# Patient Record
Sex: Female | Born: 1945 | Race: Black or African American | Hispanic: No | Marital: Single | Smoking: Current some day smoker
Health system: Southern US, Community
[De-identification: ages and names within clinical notes are randomized; demographics above are authoritative.]

## PROBLEM LIST (undated history)

## (undated) DIAGNOSIS — I700 Atherosclerosis of aorta: Secondary | ICD-10-CM

## (undated) DIAGNOSIS — Z8759 Personal history of other complications of pregnancy, childbirth and the puerperium: Secondary | ICD-10-CM

## (undated) DIAGNOSIS — F411 Generalized anxiety disorder: Secondary | ICD-10-CM

## (undated) DIAGNOSIS — J439 Emphysema, unspecified: Secondary | ICD-10-CM

## (undated) DIAGNOSIS — I779 Disorder of arteries and arterioles, unspecified: Secondary | ICD-10-CM

## (undated) DIAGNOSIS — D649 Anemia, unspecified: Secondary | ICD-10-CM

## (undated) DIAGNOSIS — K219 Gastro-esophageal reflux disease without esophagitis: Secondary | ICD-10-CM

## (undated) DIAGNOSIS — E209 Hypoparathyroidism, unspecified: Secondary | ICD-10-CM

## (undated) DIAGNOSIS — E039 Hypothyroidism, unspecified: Secondary | ICD-10-CM

## (undated) HISTORY — DX: Generalized anxiety disorder: F41.1

## (undated) HISTORY — DX: Atherosclerosis of aorta: I70.0

## (undated) HISTORY — DX: Personal history of other complications of pregnancy, childbirth and the puerperium: Z87.59

## (undated) HISTORY — DX: Hypoparathyroidism, unspecified: E20.9

## (undated) HISTORY — DX: Hypothyroidism, unspecified: E03.9

## (undated) HISTORY — DX: Gastro-esophageal reflux disease without esophagitis: K21.9

## (undated) HISTORY — PX: TONSILLECTOMY: SUR1361

## (undated) HISTORY — DX: Emphysema, unspecified: J43.9

---

## 2016-05-15 ENCOUNTER — Emergency Department (HOSPITAL_BASED_OUTPATIENT_CLINIC_OR_DEPARTMENT_OTHER)
Admission: EM | Admit: 2016-05-15 | Discharge: 2016-05-15 | Disposition: A | Discharge status: Home / Self Care | Payer: Medicare Other | Attending: Emergency Medicine | Admitting: Emergency Medicine

## 2016-05-15 ENCOUNTER — Encounter (HOSPITAL_BASED_OUTPATIENT_CLINIC_OR_DEPARTMENT_OTHER): Payer: Self-pay | Admitting: Emergency Medicine

## 2016-05-15 ENCOUNTER — Emergency Department (HOSPITAL_BASED_OUTPATIENT_CLINIC_OR_DEPARTMENT_OTHER): Payer: Medicare Other

## 2016-05-15 DIAGNOSIS — E010 Iodine-deficiency related diffuse (endemic) goiter: Secondary | ICD-10-CM | POA: Diagnosis not present

## 2016-05-15 DIAGNOSIS — R0602 Shortness of breath: Secondary | ICD-10-CM | POA: Diagnosis not present

## 2016-05-15 DIAGNOSIS — F419 Anxiety disorder, unspecified: Secondary | ICD-10-CM | POA: Diagnosis not present

## 2016-05-15 DIAGNOSIS — R0789 Other chest pain: Secondary | ICD-10-CM | POA: Diagnosis present

## 2016-05-15 DIAGNOSIS — R079 Chest pain, unspecified: Secondary | ICD-10-CM | POA: Diagnosis not present

## 2016-05-15 DIAGNOSIS — K219 Gastro-esophageal reflux disease without esophagitis: Secondary | ICD-10-CM | POA: Insufficient documentation

## 2016-05-15 DIAGNOSIS — F17200 Nicotine dependence, unspecified, uncomplicated: Secondary | ICD-10-CM | POA: Diagnosis not present

## 2016-05-15 LAB — CBC
HEMATOCRIT: 39.3 % (ref 36.0–46.0)
HEMOGLOBIN: 14.3 g/dL (ref 12.0–15.0)
MCH: 40.9 pg — AB (ref 26.0–34.0)
MCHC: 36.4 g/dL — ABNORMAL HIGH (ref 30.0–36.0)
MCV: 112.3 fL — AB (ref 78.0–100.0)
Platelets: 186 10*3/uL (ref 150–400)
RBC: 3.5 MIL/uL — AB (ref 3.87–5.11)
RDW: 13.1 % (ref 11.5–15.5)
WBC: 4.6 10*3/uL (ref 4.0–10.5)

## 2016-05-15 LAB — HEPATIC FUNCTION PANEL
ALBUMIN: 3.1 g/dL — AB (ref 3.5–5.0)
ALK PHOS: 96 U/L (ref 38–126)
ALT: 17 U/L (ref 14–54)
AST: 42 U/L — ABNORMAL HIGH (ref 15–41)
BILIRUBIN TOTAL: 0.7 mg/dL (ref 0.3–1.2)
Bilirubin, Direct: 0.2 mg/dL (ref 0.1–0.5)
Indirect Bilirubin: 0.5 mg/dL (ref 0.3–0.9)
TOTAL PROTEIN: 8.8 g/dL — AB (ref 6.5–8.1)

## 2016-05-15 LAB — BASIC METABOLIC PANEL
Anion gap: 5 (ref 5–15)
BUN: <5 mg/dL — ABNORMAL LOW (ref 6–20)
CHLORIDE: 102 mmol/L (ref 101–111)
CO2: 28 mmol/L (ref 22–32)
Calcium: 8.6 mg/dL — ABNORMAL LOW (ref 8.9–10.3)
Creatinine, Ser: 0.74 mg/dL (ref 0.44–1.00)
GFR calc non Af Amer: >60 mL/min (ref >60)
Glucose, Bld: 96 mg/dL (ref 65–99)
Potassium: 3.9 mmol/L (ref 3.5–5.1)
Sodium: 135 mmol/L (ref 135–145)

## 2016-05-15 LAB — LIPASE, BLOOD: Lipase: 24 U/L (ref 11–51)

## 2016-05-15 LAB — TROPONIN I: Troponin I: <0.03 ng/mL (ref <0.03)

## 2016-05-15 LAB — TSH: TSH: 18.49 u[IU]/mL — ABNORMAL HIGH (ref 0.350–4.500)

## 2016-05-15 MED ORDER — PANTOPRAZOLE SODIUM 20 MG PO TBEC: 20.0000 mg | DELAYED_RELEASE_TABLET | Freq: Every day | ORAL | 0 refills | Status: DC

## 2016-05-15 MED ORDER — BUSPIRONE HCL 7.5 MG PO TABS: 7.5000 mg | ORAL_TABLET | Freq: Two times a day (BID) | ORAL | 0 refills | Status: DC

## 2016-05-15 MED ORDER — FAMOTIDINE 20 MG PO TABS
20.0000 mg | ORAL_TABLET | Freq: Once | ORAL | Status: AC
Start: 2016-05-15 — End: 2016-05-15
  Administered 2016-05-15: 20 mg via ORAL
  Filled 2016-05-15: qty 1

## 2016-05-15 MED ORDER — FAMOTIDINE 20 MG PO TABS: 20.0000 mg | ORAL_TABLET | Freq: Two times a day (BID) | ORAL | 0 refills | Status: DC

## 2016-05-15 MED ORDER — SODIUM CHLORIDE 0.9 % IV BOLUS (SEPSIS)
1000.0000 mL | Freq: Once | INTRAVENOUS | Status: AC
  Administered 2016-05-15: 1000 mL via INTRAVENOUS

## 2016-05-15 MED FILL — PANTOPRAZOLE SOD DR 20 MG T: 20 | 30 days supply | Qty: 30 | Fill #0

## 2016-05-15 MED FILL — busPIRone HCL 7.5 MG TABS: 7.5 | 30 days supply | Qty: 60 | Fill #0

## 2016-05-15 MED FILL — FAMOTIDINE 20 MG TABLET: 20 | 5 days supply | Qty: 10 | Fill #0

## 2016-05-15 NOTE — ED Triage Notes (Signed)
Epigastric chest pain for over three months.  Some acid reflux.  Pt has swollen area at throat over two months.  Pt has no physician and has not gotten health care since 2011.  Chest pain non radiating, some sob, some dizziness.  No loc.

## 2016-05-15 NOTE — ED Provider Notes (Signed)
[facility] Provider Note   CSN: [id number] Arrival date & time: 05/15/16  0804     History   Chief Complaint Chief Complaint  Patient presents with  . Chest Pain    HPI Andrea Ward is a 71 y.o. female.  HPI  71 year old female presents with multiple complaints. The primary complaint appears to be a neck mass over her anterior neck. This is been going on for at least 3 months. She does not think it is rapidly growing. She has not had any neck pain or trouble swallowing or sore throat. No weight loss. She feels like her skin has been dry for the last 1 year but otherwise no new skin changes and no hair changes. She also complains of epigastric abdominal pressure for the last 3 months or more. She states that this is a almost daily occurrence although not always daily. Typically is worst in the morning and then gradually gets better. There is no exertional component. It is inferior to her sternum. Frequently has some coughing as well as bitter taste in her mouth that seems to come out from her abdomen. She has tried Nexium once or twice but otherwise no medicines. No radiation of the pain. No back pain or chest pain. No associated shortness of breath. She also has significant anxiety that she states she's had for 3 years. She has this chronically. She used to be on BuSpar and had a trauma dose of lorazepam. These par partially help with the lorazepam was better. She has not been on either these for a while. Denies known medical problems but has not seen a doctor for at least 6 or 7 years.  No past medical history on file.  There are no active problems to display for this patient.   No past surgical history on file.  OB History    No data available       Home Medications    Prior to Admission medications   Medication Sig Start Date End Date Taking? Authorizing Provider  NON FORMULARY GABA   Yes Historical Provider, MD  busPIRone (BUSPAR) 7.5 MG tablet Take 1 tablet  (7.5 mg total) by mouth 2 (two) times daily. 05/15/16   Sherwood Gambler, MD  famotidine (PEPCID) 20 MG tablet Take 1 tablet (20 mg total) by mouth 2 (two) times daily. 05/15/16   Sherwood Gambler, MD  pantoprazole (PROTONIX) 20 MG tablet Take 1 tablet (20 mg total) by mouth daily. 05/15/16   Sherwood Gambler, MD    Family History History reviewed. No pertinent family history.  Social History Social History  Substance Use Topics  . Smoking status: Current Every Day Smoker  . Smokeless tobacco: Never Used  . Alcohol use Yes     Comment: box of wine daily     Allergies   Patient has no known allergies.   Review of Systems Review of Systems  Constitutional: Negative for unexpected weight change.  HENT:       Neck swelling  Respiratory: Positive for cough. Negative for shortness of breath.   Cardiovascular: Negative for chest pain.  Gastrointestinal: Positive for abdominal pain. Negative for nausea and vomiting.  Musculoskeletal: Negative for back pain.  All other systems reviewed and are negative.    Physical Exam Updated Vital Signs BP 139/78   Pulse 100   Temp 97.7 F (36.5 C) (Oral)   Resp 20   SpO2 95%   Physical Exam  Constitutional: She is oriented to person, place, and time. She  appears well-developed and well-nourished. No distress.  HENT:  Head: Normocephalic and atraumatic.  Right Ear: External ear normal.  Left Ear: External ear normal.  Nose: Nose normal.  Eyes: Right eye exhibits no discharge. Left eye exhibits no discharge.  Neck: Neck supple. Thyromegaly (midline) present.  Cardiovascular: Regular rhythm and normal heart sounds.  Tachycardia present.   Pulmonary/Chest: Effort normal and breath sounds normal.  Abdominal: Soft. She exhibits no distension. There is no tenderness.  Neurological: She is alert and oriented to person, place, and time.  Skin: Skin is warm and dry. She is not diaphoretic.  Nursing note and vitals reviewed.    ED Treatments /  Results  Labs (all labs ordered are listed, but only abnormal results are displayed) Labs Reviewed  BASIC METABOLIC PANEL - Abnormal; Notable for the following:       Result Value   BUN <5 (*)    Calcium 8.6 (*)    All other components within normal limits  CBC - Abnormal; Notable for the following:    RBC 3.50 (*)    MCV 112.3 (*)    MCH 40.9 (*)    MCHC 36.4 (*)    All other components within normal limits  HEPATIC FUNCTION PANEL - Abnormal; Notable for the following:    Total Protein 8.8 (*)    Albumin 3.1 (*)    AST 42 (*)    All other components within normal limits  TROPONIN I  LIPASE, BLOOD  TSH  T4    EKG  EKG Interpretation  Date/Time:  Tuesday May 15 2016 08:17:26 EST Ventricular Rate:  107 PR Interval:    QRS Duration: 76 QT Interval:  319 QTC Calculation: 426 R Axis:   72 Text Interpretation:  Sinus tachycardia Right atrial enlargement No old tracing to compare Confirmed by Katesha Eichel MD, [place] [phone]) on 05/15/2016 8:35:40 AM       Radiology Dg Chest 2 View  Result Date: 05/15/2016 CLINICAL DATA:  Re- months of epigastric pain, gastroesophageal reflux, non radiating chest pain with shortness of breath and dizziness. Current smoker. EXAM: CHEST  2 VIEW COMPARISON:  None in PACs FINDINGS: The lungs are hyperinflated with hemidiaphragm flattening. There is no focal infiltrate. There is no pleural effusion. The heart and pulmonary vascularity are normal. The mediastinum is normal in width. The trachea is midline. There is calcification in the wall of the aortic arch. The bony thorax exhibits no acute abnormality. IMPRESSION: COPD. No pneumonia, CHF, nor other acute cardiopulmonary abnormality. Thoracic aortic atherosclerosis. Electronically Signed   By: David  Martinique M.D.   On: 05/15/2016 08:56    Procedures Procedures (including critical care time)  Medications Ordered in ED Medications  sodium chloride 0.9 % bolus 1,000 mL (0 mLs Intravenous Stopped  05/15/16 1024)  famotidine (PEPCID) tablet 20 mg (20 mg Oral Given 05/15/16 0910)     Initial Impression / Assessment and Plan / ED Course  I have reviewed the triage vital signs and the nursing notes.  Pertinent labs & imaging results that were available during my care of the patient were reviewed by me and considered in my medical decision making (see chart for details).  Clinical Course as of May 15 1298  Tue May 15, 2016  0902 Labs, fluids, CXR. I think ACS is unlikely. She is tachycardic but I think this is unlikely to be PE given history.  [SG]    Clinical Course User Index [SG] Sherwood Gambler, MD    Patient's epigastric pain  is most likely GERD/gastritis. Will give PPI and H2 blocker. Will restart on BuSpar as she had moderate success for her chronic anxiety. However I think she needs a primary care doctor to discuss possible other therapies rather than jumping straight to a benzodiazepine. As for her neck mass, this appears to be a moderate goiter/thyromegaly. No tenderness or concern for abscess or deep space infection. She needs an urgent but non-emergent ultrasound. Discussed the importance of a PCP for follow-up on this. Thyroid studies were sent as she arrived mildly tachycardic and these have not returned upon discharge.  Final Clinical Impressions(s) / ED Diagnoses   Final diagnoses:  Gastroesophageal reflux disease, esophagitis presence not specified  Thyromegaly  Anxiety    New Prescriptions Discharge Medication List as of 05/15/2016 10:54 AM    START taking these medications   Details  busPIRone (BUSPAR) 7.5 MG tablet Take 1 tablet (7.5 mg total) by mouth 2 (two) times daily., Starting Tue 05/15/2016, Print    famotidine (PEPCID) 20 MG tablet Take 1 tablet (20 mg total) by mouth 2 (two) times daily., Starting Tue 05/15/2016, Print    pantoprazole (PROTONIX) 20 MG tablet Take 1 tablet (20 mg total) by mouth daily., Starting Tue 05/15/2016, Print         Sherwood Gambler, MD 05/15/16 [phone]

## 2016-05-16 LAB — T4: T4 TOTAL: 4.1 ug/dL — AB (ref 4.5–12.0)

## 2016-05-17 HISTORY — PX: THYROIDECTOMY: SHX17

## 2016-05-22 ENCOUNTER — Telehealth: Payer: Self-pay

## 2016-05-22 NOTE — Telephone Encounter (Signed)
Pre-Visit Call completed. 

## 2016-05-23 ENCOUNTER — Encounter: Payer: Self-pay | Admitting: Family Medicine

## 2016-05-23 ENCOUNTER — Ambulatory Visit (INDEPENDENT_AMBULATORY_CARE_PROVIDER_SITE_OTHER): Payer: Medicare Other | Admitting: Family Medicine

## 2016-05-23 VITALS — BP 132/80 | HR 113 | Temp 98.8°F | Resp 16 | Ht 64.5 in | Wt 111.0 lb

## 2016-05-23 DIAGNOSIS — E049 Nontoxic goiter, unspecified: Secondary | ICD-10-CM | POA: Diagnosis not present

## 2016-05-23 DIAGNOSIS — K219 Gastro-esophageal reflux disease without esophagitis: Secondary | ICD-10-CM

## 2016-05-23 DIAGNOSIS — F411 Generalized anxiety disorder: Secondary | ICD-10-CM | POA: Diagnosis not present

## 2016-05-23 DIAGNOSIS — Z1231 Encounter for screening mammogram for malignant neoplasm of breast: Secondary | ICD-10-CM

## 2016-05-23 DIAGNOSIS — Z23 Encounter for immunization: Secondary | ICD-10-CM | POA: Diagnosis not present

## 2016-05-23 DIAGNOSIS — E2839 Other primary ovarian failure: Secondary | ICD-10-CM | POA: Diagnosis not present

## 2016-05-23 DIAGNOSIS — Z1239 Encounter for other screening for malignant neoplasm of breast: Secondary | ICD-10-CM

## 2016-05-23 HISTORY — DX: Gastro-esophageal reflux disease without esophagitis: K21.9

## 2016-05-23 HISTORY — DX: Generalized anxiety disorder: F41.1

## 2016-05-23 LAB — TSH: TSH: 8.3 u[IU]/mL — ABNORMAL HIGH (ref 0.35–4.50)

## 2016-05-23 LAB — T4, FREE: Free T4: 0.49 ng/dL — ABNORMAL LOW (ref 0.60–1.60)

## 2016-05-23 MED ORDER — FAMOTIDINE 20 MG PO TABS: 20.0000 mg | ORAL_TABLET | Freq: Two times a day (BID) | ORAL | 5 refills | Status: DC

## 2016-05-23 MED ORDER — BUSPIRONE HCL 7.5 MG PO TABS: 7.5000 mg | ORAL_TABLET | Freq: Three times a day (TID) | ORAL | 5 refills | Status: DC

## 2016-05-23 MED FILL — FAMOTIDINE 20 MG TABLET: 20 | 30 days supply | Qty: 60 | Fill #0 | Status: TO

## 2016-05-23 NOTE — Patient Instructions (Addendum)
Please consider counseling. The medical literature and evidence-based guidelines support it. Contact [phone] to schedule an appointment or inquire about cost/insurance coverage.  If you do not hear anything about your tests in the next 1-2 weeks, call our office and ask for an update.  If reflux symptoms return despite staying on Famotidine, call our office and we will send in the Protonix. Stay off of it for now.

## 2016-05-23 NOTE — Progress Notes (Signed)
Pre visit review using our clinic review tool, if applicable. No additional management support is needed unless otherwise documented below in the visit note. 

## 2016-05-23 NOTE — Progress Notes (Signed)
Chief Complaint  Patient presents with  . Establish Care  . Thyroid Problem  . Gastroesophageal Reflux       New Patient Visit SUBJECTIVE: HPI: Andrea Ward is an 71 y.o.female who is being seen for establishing care.  Has noticed a mass in her neck over past 3-4 mo. Thinks it may be getting larger. On 2 occasions, she was eating and noticed that she could not swallow past the area in her neck where goiter is. No pain or difficulty breathing. She experiences cold intolerance, dry skin.  Denies weight changes, palpitations, swelling, bowel changes, decreased energy. Denies radiation to head/neck region, famhx of thyroid issues, injury.  GERD CP work up in ED neg, started on famotidine and Protonix. She does feel improved. No N/V fevers.  Anxiety Long standing hx of anxiety. She has never been on any meds, nor does she see psych/counseling. She does not drive due to it. She was placed on buspirone in ED and states that is has helped so far. No SI or HI.  No Known Allergies  Past Medical History:  Diagnosis Date  . Generalized anxiety disorder 05/23/2016  . GERD (gastroesophageal reflux disease) 05/23/2016  . Goiter 05/23/2016  . No known allergies    Past Surgical History:  Procedure Laterality Date  . TONSILLECTOMY     Social History   Social History  . Marital status: Single   Social History Main Topics  . Smoking status: Current Every Day Smoker  . Smokeless tobacco: Never Used  . Alcohol use Yes     Comment: box of wine daily  . Drug use: Unknown   Family History  Problem Relation Age of Onset  . Hypertension Mother   . Hypertension Father   . Anuerysm Sister      Current Outpatient Prescriptions:  .  NON FORMULARY, GABA, Disp: , Rfl:  .  busPIRone (BUSPAR) 7.5 MG tablet, Take 1 tablet (7.5 mg total) by mouth 3 (three) times daily., Disp: 90 tablet, Rfl: 5 .  famotidine (PEPCID) 20 MG tablet, Take 1 tablet (20 mg total) by mouth 2 (two) times daily., Disp: 60  tablet, Rfl: 5  No LMP recorded. Patient is postmenopausal.  ROS Cardiovascular: Denies palpitations  Neck: +mass in neck   OBJECTIVE: BP 132/80 (BP Location: Left Arm, Cuff Size: Normal)   Pulse (!) 113   Temp 98.8 F (37.1 C) (Oral)   Resp 16   Ht 5' 4.5" (1.638 m)   Wt 111 lb (50.3 kg)   SpO2 94%   BMI 18.76 kg/m   Constitutional: -  VS reviewed -  Well developed, well nourished, appears stated age -  No apparent distress  Psychiatric: -  Oriented to person, place, and time -  Memory intact -  Affect and mood normal -  Fluent conversation, good eye contact -  Judgment and insight age appropriate  Eye: -  Conjunctivae clear, no discharge -  Pupils symmetric, round, reactive to light  ENMT: -  Ears are patent b/l without erythema or discharge. TM's are shiny and clear b/l without evidence of effusion or infection. -  Oral mucosa without lesions, tongue and uvula midline    Tonsils not enlarged, no erythema, no exudate, trachea midline    Pharynx moist, no lesions, no erythema  Neck: -  No gross swelling, no palpable masses -  Thyroid midline, enlarged, nodule palpated at R upper pole, no bruit or TTP  Cardiovascular: -  Slight tachycardia, reg rate, no murmurs -  No LE edema  Respiratory: -  Normal respiratory effort, no accessory muscle use, no retraction -  Breath sounds equal, no wheezes, no ronchi, no crackles  Neurological:  -  CN II - XII grossly intact -  Sensation grossly intact to light touch, equal bilaterally  Musculoskeletal: -  No clubbing, no cyanosis -  Gait normal  Skin: -  No significant lesion on inspection -  Warm and dry to palpation   ASSESSMENT/PLAN: Goiter - Plan: US THYROID, TSH, T4, free  Gastroesophageal reflux disease, esophagitis presence not specified - Plan: famotidine (PEPCID) 20 MG tablet  Generalized anxiety disorder - Plan: busPIRone (BUSPAR) 7.5 MG tablet, Ambulatory referral to Psychology  Estrogen deficiency - Plan: DG Bone  Density  Screening for breast cancer - Plan: MM DIGITAL SCREENING BILATERAL  Need for pneumococcal vaccine - Plan: Pneumococcal conjugate vaccine 13-valent  Need for diphtheria-tetanus-pertussis (Tdap) vaccine - Plan: Tdap vaccine greater than or equal to 7yo IM  [place] of neck, recheck thyroid levels, as it is having some obstructive properites, will likely refer to ENT after results of [place] return.  Stop Protonix, stay on famotidine, will add back on if symptoms return. Increase Buspirone to TID from BID and see back in 2-4 weeks. Refer to psych and number given as well for counseling and CBT.  Patient should return in 2-4 weeks to check anxiety. The patient voiced understanding and agreement to the plan.   [place], DO 05/23/16  10:16 AM

## 2016-05-31 ENCOUNTER — Ambulatory Visit (HOSPITAL_BASED_OUTPATIENT_CLINIC_OR_DEPARTMENT_OTHER)
Admission: RE | Admit: 2016-05-31 | Discharge: 2016-05-31 | Disposition: A | Discharge status: Home / Self Care | Payer: Medicare Other | Source: Ambulatory Visit | Attending: Family Medicine | Admitting: Family Medicine

## 2016-05-31 DIAGNOSIS — E049 Nontoxic goiter, unspecified: Secondary | ICD-10-CM | POA: Diagnosis not present

## 2016-05-31 DIAGNOSIS — Z1239 Encounter for other screening for malignant neoplasm of breast: Secondary | ICD-10-CM

## 2016-05-31 DIAGNOSIS — M8588 Other specified disorders of bone density and structure, other site: Secondary | ICD-10-CM | POA: Insufficient documentation

## 2016-05-31 DIAGNOSIS — E2839 Other primary ovarian failure: Secondary | ICD-10-CM | POA: Diagnosis present

## 2016-05-31 DIAGNOSIS — M85852 Other specified disorders of bone density and structure, left thigh: Secondary | ICD-10-CM | POA: Diagnosis not present

## 2016-05-31 DIAGNOSIS — Z1231 Encounter for screening mammogram for malignant neoplasm of breast: Secondary | ICD-10-CM | POA: Diagnosis not present

## 2016-06-01 ENCOUNTER — Other Ambulatory Visit: Payer: Self-pay | Admitting: Family Medicine

## 2016-06-01 DIAGNOSIS — E049 Nontoxic goiter, unspecified: Secondary | ICD-10-CM

## 2016-06-12 DIAGNOSIS — E040 Nontoxic diffuse goiter: Secondary | ICD-10-CM | POA: Diagnosis not present

## 2016-06-12 DIAGNOSIS — R490 Dysphonia: Secondary | ICD-10-CM | POA: Diagnosis not present

## 2016-06-12 DIAGNOSIS — F17210 Nicotine dependence, cigarettes, uncomplicated: Secondary | ICD-10-CM | POA: Diagnosis not present

## 2016-06-12 DIAGNOSIS — E039 Hypothyroidism, unspecified: Secondary | ICD-10-CM | POA: Insufficient documentation

## 2016-06-12 DIAGNOSIS — R1313 Dysphagia, pharyngeal phase: Secondary | ICD-10-CM | POA: Insufficient documentation

## 2016-06-13 ENCOUNTER — Ambulatory Visit (INDEPENDENT_AMBULATORY_CARE_PROVIDER_SITE_OTHER): Payer: Medicare Other | Admitting: Family Medicine

## 2016-06-13 ENCOUNTER — Encounter: Payer: Self-pay | Admitting: Family Medicine

## 2016-06-13 VITALS — BP 128/80 | HR 84 | Temp 98.2°F | Resp 16 | Ht 65.0 in | Wt 111.4 lb

## 2016-06-13 DIAGNOSIS — F411 Generalized anxiety disorder: Secondary | ICD-10-CM | POA: Diagnosis not present

## 2016-06-13 MED ORDER — BUSPIRONE HCL 10 MG PO TABS: 10.0000 mg | ORAL_TABLET | Freq: Three times a day (TID) | ORAL | 2 refills | Status: DC

## 2016-06-13 MED FILL — busPIRone HCL 10 MG TABS: 10 | 30 days supply | Qty: 90 | Fill #0 | Status: TO

## 2016-06-13 NOTE — Progress Notes (Signed)
Pre visit review using our clinic review tool, if applicable. No additional management support is needed unless otherwise documented below in the visit note. 

## 2016-06-13 NOTE — Progress Notes (Signed)
Chief Complaint  Patient presents with  . Anxiety    Subjective Andrea Ward is an 71 y.o. female who presents with anxiety. Used to have strong anxiety and shaking associated with riding in a car. She was started on Buspar 7.5 TID. She is tolerating the medication well and is compliant with it.  She feels much improved, about 75% improved, but feels should could get better. No HI or SI. No self medication. She is not following with a psychologist.  Past Medical History:  Diagnosis Date  . Generalized anxiety disorder 05/23/2016  . GERD (gastroesophageal reflux disease) 05/23/2016  . Goiter 05/23/2016  . No known allergies     Medications Current Outpatient Prescriptions on File Prior to Visit  Medication Sig Dispense Refill  . famotidine (PEPCID) 20 MG tablet Take 1 tablet (20 mg total) by mouth 2 (two) times daily. 60 tablet 5  . NON FORMULARY GABA     Allergies No Known Allergies Family History Family History  Problem Relation Age of Onset  . Hypertension Mother   . Hypertension Father   . Anuerysm Sister    Review Of Systems Constitutional:  no unexplained fevers, sweats, or chills Psychiatric: as noted in HPI  Exam BP 128/80 (BP Location: Left Arm, Cuff Size: Normal)   Pulse 84   Temp 98.2 F (36.8 C) (Oral)   Resp 16   Ht 5\' 5"  (1.651 m)   Wt 111 lb 6.4 oz (50.5 kg)   SpO2 94%   BMI 18.54 kg/m  General:  well developed, well nourished, in no apparent distress Neck: neck supple without adenopathy, +thyromegaly, no masses Lungs:  clear to auscultation, breath sounds equal bilaterally, normal respiratory effort without accessory muscle use Cardio:  regular rate and rhythm without murmurs Neuro:  deep tendon reflexes normal and symmetric and no cerebellar signs or ataxia noted Psych: well oriented with normal range of affect and age-appropriate judgement/insight  Assessment and Plan  Generalized anxiety disorder - Plan: busPIRone (BUSPAR) 10 MG  tablet  Status: Uncontrolled  Counseled on the diagnosis, course and treatment of the above condition(s) Suicidal ideation was strongly denied Follow up in 1 mo.  [place], DO 06/13/16 8:58 AM

## 2016-06-13 NOTE — Patient Instructions (Signed)
Keep up the good work.  Let us know if you need anything.  

## 2016-07-11 DIAGNOSIS — F419 Anxiety disorder, unspecified: Secondary | ICD-10-CM | POA: Diagnosis not present

## 2016-07-11 DIAGNOSIS — E049 Nontoxic goiter, unspecified: Secondary | ICD-10-CM | POA: Diagnosis not present

## 2016-07-11 DIAGNOSIS — E063 Autoimmune thyroiditis: Secondary | ICD-10-CM | POA: Diagnosis not present

## 2016-07-11 DIAGNOSIS — E042 Nontoxic multinodular goiter: Secondary | ICD-10-CM | POA: Diagnosis not present

## 2016-07-11 DIAGNOSIS — F17210 Nicotine dependence, cigarettes, uncomplicated: Secondary | ICD-10-CM | POA: Diagnosis not present

## 2016-07-11 DIAGNOSIS — Z716 Tobacco abuse counseling: Secondary | ICD-10-CM | POA: Diagnosis not present

## 2016-07-11 DIAGNOSIS — K219 Gastro-esophageal reflux disease without esophagitis: Secondary | ICD-10-CM | POA: Diagnosis not present

## 2016-07-11 DIAGNOSIS — Z79899 Other long term (current) drug therapy: Secondary | ICD-10-CM | POA: Diagnosis not present

## 2016-07-11 DIAGNOSIS — R1313 Dysphagia, pharyngeal phase: Secondary | ICD-10-CM | POA: Diagnosis not present

## 2016-07-11 HISTORY — PX: THYROIDECTOMY: SHX17

## 2016-07-12 DIAGNOSIS — F17210 Nicotine dependence, cigarettes, uncomplicated: Secondary | ICD-10-CM | POA: Diagnosis not present

## 2016-07-12 DIAGNOSIS — K219 Gastro-esophageal reflux disease without esophagitis: Secondary | ICD-10-CM | POA: Diagnosis not present

## 2016-07-12 DIAGNOSIS — E042 Nontoxic multinodular goiter: Secondary | ICD-10-CM | POA: Diagnosis not present

## 2016-07-12 DIAGNOSIS — F419 Anxiety disorder, unspecified: Secondary | ICD-10-CM | POA: Diagnosis not present

## 2016-07-12 DIAGNOSIS — R1313 Dysphagia, pharyngeal phase: Secondary | ICD-10-CM | POA: Diagnosis not present

## 2016-07-12 DIAGNOSIS — E063 Autoimmune thyroiditis: Secondary | ICD-10-CM | POA: Diagnosis not present

## 2016-07-16 ENCOUNTER — Ambulatory Visit: Payer: Medicare Other | Admitting: Family Medicine

## 2016-07-23 ENCOUNTER — Ambulatory Visit (INDEPENDENT_AMBULATORY_CARE_PROVIDER_SITE_OTHER): Payer: Medicare Other | Admitting: Family Medicine

## 2016-07-23 ENCOUNTER — Encounter: Payer: Self-pay | Admitting: Family Medicine

## 2016-07-23 VITALS — BP 116/62 | HR 93 | Temp 98.0°F | Ht 65.0 in | Wt 108.8 lb

## 2016-07-23 DIAGNOSIS — F411 Generalized anxiety disorder: Secondary | ICD-10-CM | POA: Diagnosis not present

## 2016-07-23 DIAGNOSIS — F17210 Nicotine dependence, cigarettes, uncomplicated: Secondary | ICD-10-CM

## 2016-07-23 DIAGNOSIS — R0789 Other chest pain: Secondary | ICD-10-CM

## 2016-07-23 NOTE — Patient Instructions (Addendum)
OK to take Tylenol 1000 mg (2 extra strength tabs) or 975 mg (3 regular strength tabs) every 6 hours as needed.  If you do not hear anything about your scan in the next 1-2 weeks, call our office and ask for an update.  Please consider counseling. The medical literature and evidence-based guidelines support it. Contact [phone] to schedule an appointment or inquire about cost/insurance coverage.

## 2016-07-23 NOTE — Progress Notes (Signed)
Pre visit review using our clinic review tool, if applicable. No additional management support is needed unless otherwise documented below in the visit note. 

## 2016-07-23 NOTE — Progress Notes (Signed)
Chief Complaint  Patient presents with  . Follow-up    1 mos on anxiety and discuss test results    Subjective Andrea Ward presents for f/u anxiety.  Was recently started in [place], increased from 7.5 mg TID to 10 mg TID. Since then, she reports being much improved. Mainly, when she rides in the car, she is not as tense or anxious. Also outside of the car, she is doing fine. She is pleased where she is at. She is compliant with medicine and tolerating it well.  No thoughts of harming self or others. No self-medication with alcohol, prescription drugs or illicit drugs.  Patient has also had a history of pain along her bra line. This will come and go typically affecting her 2-3 times per week. It usually lasts for 1-2 hours. It comes about spontaneously. It is not brought on by exertion. She denies associated jaw pain, arm pain, shortness of breath, cough, or fevers. The pain medicine she has received from her surgeon has helped with his pain. Pressing on the area makes it worse. No other associated palliative or provocation of factors. She is asking if anything showed up on recent CXR that could be an explanation for this pain.   ROS Psych: No homicidal or suicidal thoughts MSK: +chest wall/rib pain  Past Medical History:  Diagnosis Date  . Generalized anxiety disorder 05/23/2016  . GERD (gastroesophageal reflux disease) 05/23/2016  . Goiter 05/23/2016   Family History  Problem Relation Age of Onset  . Hypertension Mother   . Hypertension Father   . Anuerysm Sister    Allergies as of 07/23/2016   No Known Allergies     Medication List       Accurate as of 07/23/16 12:18 PM. Always use your most recent med list.          busPIRone 10 MG tablet Commonly known as:  BUSPAR Take 1 tablet (10 mg total) by mouth 3 (three) times daily.   calcitRIOL 0.5 MCG capsule Commonly known as:  ROCALTROL Take 1 capsule by mouth 2 (two) times daily. For 20 days   calcium carbonate 1250 (500  Ca) MG tablet Commonly known as:  OS-CAL - dosed in mg of elemental calcium Take 2 tablets by mouth 3 (three) times daily.   famotidine 20 MG tablet Commonly known as:  PEPCID Take 1 tablet (20 mg total) by mouth 2 (two) times daily.   levothyroxine 75 MCG tablet Commonly known as:  SYNTHROID, LEVOTHROID Take 75 mcg by mouth daily.   NON FORMULARY GABA   traMADol 50 MG tablet Commonly known as:  ULTRAM Take 1 tablet by mouth every 4 hours as needed for pain for up 5 days.       Exam BP 116/62 (BP Location: Left Arm, Patient Position: Sitting, Cuff Size: Normal)   Pulse 93   Temp 98 F (36.7 C) (Oral)   Ht 5\' 5"  (1.651 m)   Wt 108 lb 12.8 oz (49.4 kg)   SpO2 91%   BMI 18.11 kg/m  General:  well developed, well nourished, in no apparent distress Neck: neck supple without adenopathy, thyromegaly, or masses Lungs:  clear to auscultation, breath sounds equal bilaterally, no respiratory distress Cardio:  regular rate and rhythm without murmurs, heart sounds without clicks or rubs Abd: Bowel sounds present, soft, nontender, nondistended, no masses or organomegaly MSK: Pain is not reproducible to palpation over rib cage Psych: well oriented with normal range of affect and age-appropriate judgement/insight, alert and  oriented x4.  Assessment and Plan  Generalized anxiety disorder  Chest wall pain  Smokes with greater than 30 pack year history - Plan: CT CHEST LUNG CANCER SCREENING LOW DOSE WO CONTRAST  Orders as above. Cont BuSpar 10 mg TID. Gave number for counseling should she desire. Pain is likely msk in etiology. Tylenol. Heat. CXR review, no causes for pain ID'd. CT for extensive smoking hx. Counseled on cessation today. She has cut down to 1/2 pack daily from 3 packs daily.  F/u in 3 mo to recheck anxiety. The patient voiced understanding and agreement to the plan.  [place], DO 07/23/16 12:18 PM

## 2016-08-01 DIAGNOSIS — E890 Postprocedural hypothyroidism: Secondary | ICD-10-CM | POA: Diagnosis not present

## 2016-08-20 DIAGNOSIS — E890 Postprocedural hypothyroidism: Secondary | ICD-10-CM | POA: Diagnosis not present

## 2016-09-14 DIAGNOSIS — E890 Postprocedural hypothyroidism: Secondary | ICD-10-CM | POA: Diagnosis not present

## 2016-10-26 ENCOUNTER — Other Ambulatory Visit: Payer: Self-pay | Admitting: Family Medicine

## 2016-10-26 DIAGNOSIS — E890 Postprocedural hypothyroidism: Secondary | ICD-10-CM | POA: Diagnosis not present

## 2016-10-26 DIAGNOSIS — F411 Generalized anxiety disorder: Secondary | ICD-10-CM

## 2016-10-26 NOTE — Telephone Encounter (Signed)
Rx approved and sent to the pharmacy by e-script.  However only given #90.  The patient needs further evaluation and/or laboratory testing before further refills are given.  Ask patient to make an appointment for this.//AB/CMA

## 2016-10-29 ENCOUNTER — Other Ambulatory Visit: Payer: Self-pay | Admitting: Family Medicine

## 2016-10-29 DIAGNOSIS — F411 Generalized anxiety disorder: Secondary | ICD-10-CM

## 2016-10-29 NOTE — Telephone Encounter (Signed)
Rx approved and faxed to the pharmacy.  Confirmation received.//AB/CMA 

## 2016-12-07 ENCOUNTER — Other Ambulatory Visit: Payer: Self-pay | Admitting: Family Medicine

## 2016-12-07 DIAGNOSIS — F17210 Nicotine dependence, cigarettes, uncomplicated: Secondary | ICD-10-CM | POA: Diagnosis not present

## 2016-12-07 DIAGNOSIS — F411 Generalized anxiety disorder: Secondary | ICD-10-CM

## 2016-12-07 DIAGNOSIS — E890 Postprocedural hypothyroidism: Secondary | ICD-10-CM | POA: Diagnosis not present

## 2016-12-07 DIAGNOSIS — R531 Weakness: Secondary | ICD-10-CM | POA: Diagnosis not present

## 2017-01-31 ENCOUNTER — Other Ambulatory Visit: Payer: Self-pay | Admitting: Family Medicine

## 2017-01-31 DIAGNOSIS — F411 Generalized anxiety disorder: Secondary | ICD-10-CM

## 2017-01-31 MED ORDER — BUSPIRONE HCL 10 MG PO TABS: 10.0000 mg | ORAL_TABLET | Freq: Three times a day (TID) | ORAL | 0 refills | Status: DC

## 2017-02-20 DIAGNOSIS — E890 Postprocedural hypothyroidism: Secondary | ICD-10-CM | POA: Diagnosis not present

## 2017-02-20 DIAGNOSIS — E892 Postprocedural hypoparathyroidism: Secondary | ICD-10-CM | POA: Diagnosis not present

## 2017-02-28 DIAGNOSIS — E892 Postprocedural hypoparathyroidism: Secondary | ICD-10-CM | POA: Diagnosis not present

## 2017-03-21 DIAGNOSIS — E890 Postprocedural hypothyroidism: Secondary | ICD-10-CM | POA: Diagnosis not present

## 2017-05-07 ENCOUNTER — Other Ambulatory Visit: Payer: Self-pay | Admitting: Family Medicine

## 2017-05-07 DIAGNOSIS — F411 Generalized anxiety disorder: Secondary | ICD-10-CM

## 2017-05-08 MED ORDER — BUSPIRONE HCL 10 MG PO TABS: 10.0000 mg | ORAL_TABLET | Freq: Three times a day (TID) | ORAL | 0 refills | Status: DC

## 2017-05-21 DIAGNOSIS — E890 Postprocedural hypothyroidism: Secondary | ICD-10-CM | POA: Diagnosis not present

## 2017-05-21 DIAGNOSIS — E892 Postprocedural hypoparathyroidism: Secondary | ICD-10-CM | POA: Diagnosis not present

## 2017-06-05 DIAGNOSIS — E892 Postprocedural hypoparathyroidism: Secondary | ICD-10-CM | POA: Diagnosis not present

## 2017-07-04 NOTE — Progress Notes (Addendum)
Subjective:   Andrea Ward is a 72 y.o. female who presents for an Initial Medicare Annual Wellness Visit. Pt here with daughter.  Review of Systems   No ROS.  Medicare Wellness Visit. Additional risk factors are reflected in the social history.   Sleep patterns: Naps daily. Sleeps 8-9 hrs.  Home Safety/Smoke Alarms: Feels safe in home. Smoke alarms in place.  Living environment; residence and Firearm Safety: 1 story. Lives alone.    Female:     Mammo-  order     Dexa scan-utd        CCS- pt last reported 2011   Objective:    Today's Vitals   07/09/17 1033  BP: (!) 144/80  Pulse: (!) 101  SpO2: 93%  Weight: 108 lb 3.2 oz (49.1 kg)  Height: 5\' 5"  (1.651 m)   Body mass index is 18.01 kg/m. Wt Readings from Last 3 Encounters:  07/09/17 108 lb 3.2 oz (49.1 kg)  07/23/16 108 lb 12.8 oz (49.4 kg)  06/13/16 111 lb 6.4 oz (50.5 kg)   Temp Readings from Last 3 Encounters:  07/23/16 98 F (36.7 C) (Oral)  06/13/16 98.2 F (36.8 C) (Oral)  05/23/16 98.8 F (37.1 C) (Oral)   BP Readings from Last 3 Encounters:  07/09/17 (!) 144/80  07/23/16 116/62  06/13/16 128/80   Pulse Readings from Last 3 Encounters:  07/09/17 (!) 101  07/23/16 93  06/13/16 84    Advanced Directives 07/09/2017 05/15/2016  Does Patient Have a Medical Advance Directive? Yes No  Type of Paramedic of [place];Living will -  Does patient want to make changes to medical advance directive? No - Patient declined -  Copy of [place] in Chart? No - copy requested -  Would patient like information on creating a medical advance directive? - No - Patient declined    Current Medications (verified) Outpatient Encounter Medications as of 07/09/2017  Medication Sig  . busPIRone (BUSPAR) 10 MG tablet Take 1 tablet (10 mg total) by mouth 3 (three) times daily. Needs appointment for further refills.  . calcitRIOL (ROCALTROL) 0.25 MCG capsule Take 0.25 mcg by mouth  daily.  . calcium carbonate (OS-CAL - DOSED IN MG OF ELEMENTAL CALCIUM) 1250 (500 Ca) MG tablet Take 2 tablets by mouth 3 (three) times daily.  . famotidine (PEPCID) 20 MG tablet Take 1 tablet (20 mg total) by mouth 2 (two) times daily. (Patient not taking: Reported on 07/09/2017)  . levothyroxine (SYNTHROID, LEVOTHROID) 75 MCG tablet Take 75 mcg by mouth daily.  . NON FORMULARY GABA  . traMADol (ULTRAM) 50 MG tablet Take 1 tablet by mouth every 4 hours as needed for pain for up 5 days.   No facility-administered encounter medications on file as of 07/09/2017.     Allergies (verified) Patient has no known allergies.   History: Past Medical History:  Diagnosis Date  . Generalized anxiety disorder 05/23/2016  . GERD (gastroesophageal reflux disease) 05/23/2016  . Goiter 05/23/2016   Past Surgical History:  Procedure Laterality Date  . THYROIDECTOMY  05/17/2016  . TONSILLECTOMY     Family History  Problem Relation Age of Onset  . Hypertension Mother   . Hypertension Father   . Anuerysm Sister    Social History   Socioeconomic History  . Marital status: Single    Spouse name: Not on file  . Number of children: Not on file  . Years of education: Not on file  . Highest education level:  Not on file  Occupational History  . Not on file  Social Needs  . Financial resource strain: Not on file  . Food insecurity:    Worry: Not on file    Inability: Not on file  . Transportation needs:    Medical: Not on file    Non-medical: Not on file  Tobacco Use  . Smoking status: Current Every Day Smoker    Packs/day: 0.50    Years: 50.00    Pack years: 25.00    Types: Cigarettes  . Smokeless tobacco: Never Used  Substance and Sexual Activity  . Alcohol use: Yes    Comment: box of wine daily  . Drug use: Not Currently  . Sexual activity: Not Currently  Lifestyle  . Physical activity:    Days per week: Not on file    Minutes per session: Not on file  . Stress: Not on file    Relationships  . Social connections:    Talks on phone: Not on file    Gets together: Not on file    Attends religious service: Not on file    Active member of club or organization: Not on file    Attends meetings of clubs or organizations: Not on file    Relationship status: Not on file  Other Topics Concern  . Not on file  Social History Narrative  . Not on file    Tobacco Counseling Ready to quit: No Counseling given: No   Clinical Intake:  Pain : No/denies pain    Activities of Daily Living In your present state of health, do you have any difficulty performing the following activities: 07/09/2017 07/23/2016  Hearing? N N  Vision? N N  Comment wearing glasses. Eye doctor as needed.  -  Difficulty concentrating or making decisions? N N  Walking or climbing stairs? N N  Dressing or bathing? N N  Doing errands, shopping? N N  Preparing Food and eating ? N -  Using the Toilet? N -  In the past six months, have you accidently leaked urine? N -  Do you have problems with loss of bowel control? N -  Managing your Medications? N -  Managing your Finances? N -  Housekeeping or managing your Housekeeping? N -  Some recent data might be hidden     Immunizations and Health Maintenance Immunization History  Administered Date(s) Administered  . Pneumococcal Conjugate-13 05/23/2016  . Tdap 05/23/2016   Health Maintenance Due  Topic Date Due  . Hepatitis C Screening  1945-08-16  . PNA vac Low Risk Adult (2 of 2 - PPSV23) 05/23/2017    Patient Care Team: Shelda Pal, DO as PCP - General (Family Medicine)  Indicate any recent Medical Services you may have received from other than [facility] providers in the past year (date may be approximate).     Assessment:   This is a routine wellness examination for [place]. Physical assessment deferred to PCP.  Hearing/Vision screen  Visual Acuity Screening   Right eye Left eye Both eyes  Without correction:     With  correction: 20/25 20/25 20/25   Hearing Screening Comments: Able to hear conversational tones w/o difficulty. No issues reported.    Dietary issues and exercise activities discussed: Current Exercise Habits: The patient does not participate in regular exercise at present, Exercise limited by: None identified Diet (meal preparation, eat out, water intake, caffeinated beverages, dairy products, fruits and vegetables): Eating frozen foods that are high in salt for every  meal.    Goals    None     Depression Screen PHQ 2/9 Scores 07/09/2017 05/23/2016  PHQ - 2 Score 0 0    Fall Risk Fall Risk  07/09/2017 05/23/2016  Falls in the past year? No No     Cognitive Function:  MMSE - Mini Mental State Exam 07/09/2017  Orientation to time 5  Orientation to Place 5  Registration 3  Attention/ Calculation 5  Recall 2  Language- name 2 objects 2  Language- repeat 1  Language- follow 3 step command 3  Language- read & follow direction 1  Write a sentence 1  Copy design 1  Total score 29        Screening Tests Health Maintenance  Topic Date Due  . Hepatitis C Screening  November 19, 1945  . PNA vac Low Risk Adult (2 of 2 - PPSV23) 05/23/2017  . INFLUENZA VACCINE  06/17/2026 (Originally 10/17/2017)  . MAMMOGRAM  06/01/2018  . COLONOSCOPY  03/20/2019  . TETANUS/TDAP  05/24/2026  . DEXA SCAN  Completed      Plan:  Follow up with Dr.Wendling tomorrow at 315 as scheduled.  Eat heart healthy diet (full of fruits, vegetables, whole grains, lean protein, water--limit salt, fat, and sugar intake) and increase physical activity as tolerated.  Try to really limit your salt intake.  Continue to cut back on smoking and let us know if we can help in any way.  Continue doing brain stimulating activities (puzzles, reading, adult coloring books, staying active) to keep memory sharp.    I have personally reviewed and noted the following in the patient's chart:   . Medical and social history . Use  of alcohol, tobacco or illicit drugs  . Current medications and supplements . Functional ability and status . Nutritional status . Physical activity . Advanced directives . List of other physicians . Hospitalizations, surgeries, and ER visits in previous 12 months . Vitals . Screenings to include cognitive, depression, and falls . Referrals and appointments  In addition, I have reviewed and discussed with patient certain preventive protocols, quality metrics, and best practice recommendations. A written personalized care plan for preventive services as well as general preventive health recommendations were provided to patient.     Naaman Plummer [place], [place]   07/09/2017    Kathlene November, MD

## 2017-07-09 ENCOUNTER — Encounter: Payer: Self-pay | Admitting: *Deleted

## 2017-07-09 ENCOUNTER — Ambulatory Visit (INDEPENDENT_AMBULATORY_CARE_PROVIDER_SITE_OTHER): Payer: Medicare Other | Admitting: *Deleted

## 2017-07-09 VITALS — BP 144/80 | HR 101 | Ht 65.0 in | Wt 108.2 lb

## 2017-07-09 DIAGNOSIS — Z1239 Encounter for other screening for malignant neoplasm of breast: Secondary | ICD-10-CM

## 2017-07-09 DIAGNOSIS — Z0000 Encounter for general adult medical examination without abnormal findings: Secondary | ICD-10-CM

## 2017-07-09 NOTE — Patient Instructions (Signed)
Follow up with Dr.Wendling tomorrow at 315 as scheduled.  Eat heart healthy diet (full of fruits, vegetables, whole grains, lean protein, water--limit salt, fat, and sugar intake) and increase physical activity as tolerated.  Try to really limit your salt intake.  Continue to cut back on smoking and let us know if we can help in any way.  Continue doing brain stimulating activities (puzzles, reading, adult coloring books, staying active) to keep memory sharp.    Health Maintenance for Postmenopausal Women Menopause is a normal process in which your reproductive ability comes to an end. This process happens gradually over a span of months to years, usually between the ages of 67 and 33. Menopause is complete when you have missed 12 consecutive menstrual periods. It is important to talk with your health care provider about some of the most common conditions that affect postmenopausal women, such as heart disease, cancer, and bone loss (osteoporosis). Adopting a healthy lifestyle and getting preventive care can help to promote your health and wellness. Those actions can also lower your chances of developing some of these common conditions. What should I know about menopause? During menopause, you may experience a number of symptoms, such as:  Moderate-to-severe hot flashes.  Night sweats.  Decrease in sex drive.  Mood swings.  Headaches.  Tiredness.  Irritability.  Memory problems.  Insomnia.  Choosing to treat or not to treat menopausal changes is an individual decision that you make with your health care provider. What should I know about hormone replacement therapy and supplements? Hormone therapy products are effective for treating symptoms that are associated with menopause, such as hot flashes and night sweats. Hormone replacement carries certain risks, especially as you become older. If you are thinking about using estrogen or estrogen with progestin treatments, discuss the  benefits and risks with your health care provider. What should I know about heart disease and stroke? Heart disease, heart attack, and stroke become more likely as you age. This may be due, in part, to the hormonal changes that your body experiences during menopause. These can affect how your body processes dietary fats, triglycerides, and cholesterol. Heart attack and stroke are both medical emergencies. There are many things that you can do to help prevent heart disease and stroke:  Have your blood pressure checked at least every 1-2 years. High blood pressure causes heart disease and increases the risk of stroke.  If you are 5-62 years old, ask your health care provider if you should take aspirin to prevent a heart attack or a stroke.  Do not use any tobacco products, including cigarettes, chewing tobacco, or electronic cigarettes. If you need help quitting, ask your health care provider.  It is important to eat a healthy diet and maintain a healthy weight. ? Be sure to include plenty of vegetables, fruits, low-fat dairy products, and lean protein. ? Avoid eating foods that are high in solid fats, added sugars, or salt (sodium).  Get regular exercise. This is one of the most important things that you can do for your health. ? Try to exercise for at least 150 minutes each week. The type of exercise that you do should increase your heart rate and make you sweat. This is known as moderate-intensity exercise. ? Try to do strengthening exercises at least twice each week. Do these in addition to the moderate-intensity exercise.  Know your numbers.Ask your health care provider to check your cholesterol and your blood glucose. Continue to have your blood tested as directed  by your health care provider.  What should I know about cancer screening? There are several types of cancer. Take the following steps to reduce your risk and to catch any cancer development as early as possible. Breast  Cancer  Practice breast self-awareness. ? This means understanding how your breasts normally appear and feel. ? It also means doing regular breast self-exams. Let your health care provider know about any changes, no matter how small.  If you are 7 or older, have a clinician do a breast exam (clinical breast exam or CBE) every year. Depending on your age, family history, and medical history, it may be recommended that you also have a yearly breast X-ray (mammogram).  If you have a family history of breast cancer, talk with your health care provider about genetic screening.  If you are at high risk for breast cancer, talk with your health care provider about having an MRI and a mammogram every year.  Breast cancer (BRCA) gene test is recommended for women who have family members with BRCA-related cancers. Results of the assessment will determine the need for genetic counseling and BRCA1 and for BRCA2 testing. BRCA-related cancers include these types: ? Breast. This occurs in males or females. ? Ovarian. ? Tubal. This may also be called fallopian tube cancer. ? Cancer of the abdominal or pelvic lining (peritoneal cancer). ? Prostate. ? Pancreatic.  Cervical, Uterine, and Ovarian Cancer Your health care provider may recommend that you be screened regularly for cancer of the pelvic organs. These include your ovaries, uterus, and vagina. This screening involves a pelvic exam, which includes checking for microscopic changes to the surface of your cervix (Pap test).  For women ages 21-65, health care providers may recommend a pelvic exam and a Pap test every three years. For women ages 69-65, they may recommend the Pap test and pelvic exam, combined with testing for human papilloma virus (HPV), every five years. Some types of HPV increase your risk of cervical cancer. Testing for HPV may also be done on women of any age who have unclear Pap test results.  Other health care providers may not  recommend any screening for nonpregnant women who are considered low risk for pelvic cancer and have no symptoms. Ask your health care provider if a screening pelvic exam is right for you.  If you have had past treatment for cervical cancer or a condition that could lead to cancer, you need Pap tests and screening for cancer for at least 20 years after your treatment. If Pap tests have been discontinued for you, your risk factors (such as having a new sexual partner) need to be reassessed to determine if you should start having screenings again. Some women have medical problems that increase the chance of getting cervical cancer. In these cases, your health care provider may recommend that you have screening and Pap tests more often.  If you have a family history of uterine cancer or ovarian cancer, talk with your health care provider about genetic screening.  If you have vaginal bleeding after reaching menopause, tell your health care provider.  There are currently no reliable tests available to screen for ovarian cancer.  Lung Cancer Lung cancer screening is recommended for adults 65-52 years old who are at high risk for lung cancer because of a history of smoking. A yearly low-dose CT scan of the lungs is recommended if you:  Currently smoke.  Have a history of at least 30 pack-years of smoking and you currently smoke  or have quit within the past 15 years. A pack-year is smoking an average of one pack of cigarettes per day for one year.  Yearly screening should:  Continue until it has been 15 years since you quit.  Stop if you develop a health problem that would prevent you from having lung cancer treatment.  Colorectal Cancer  This type of cancer can be detected and can often be prevented.  Routine colorectal cancer screening usually begins at age 55 and continues through age 29.  If you have risk factors for colon cancer, your health care provider may recommend that you be screened  at an earlier age.  If you have a family history of colorectal cancer, talk with your health care provider about genetic screening.  Your health care provider may also recommend using home test kits to check for hidden blood in your stool.  A small camera at the end of a tube can be used to examine your colon directly (sigmoidoscopy or colonoscopy). This is done to check for the earliest forms of colorectal cancer.  Direct examination of the colon should be repeated every 5-10 years until age 46. However, if early forms of precancerous polyps or small growths are found or if you have a family history or genetic risk for colorectal cancer, you may need to be screened more often.  Skin Cancer  Check your skin from head to toe regularly.  Monitor any moles. Be sure to tell your health care provider: ? About any new moles or changes in moles, especially if there is a change in a mole's shape or color. ? If you have a mole that is larger than the size of a pencil eraser.  If any of your family members has a history of skin cancer, especially at a young age, talk with your health care provider about genetic screening.  Always use sunscreen. Apply sunscreen liberally and repeatedly throughout the day.  Whenever you are outside, protect yourself by wearing long sleeves, pants, a wide-brimmed hat, and sunglasses.  What should I know about osteoporosis? Osteoporosis is a condition in which bone destruction happens more quickly than new bone creation. After menopause, you may be at an increased risk for osteoporosis. To help prevent osteoporosis or the bone fractures that can happen because of osteoporosis, the following is recommended:  If you are 65-7 years old, get at least 1,000 mg of calcium and at least 600 mg of vitamin D per day.  If you are older than age 34 but younger than age 61, get at least 1,200 mg of calcium and at least 600 mg of vitamin D per day.  If you are older than age 40,  get at least 1,200 mg of calcium and at least 800 mg of vitamin D per day.  Smoking and excessive alcohol intake increase the risk of osteoporosis. Eat foods that are rich in calcium and vitamin D, and do weight-bearing exercises several times each week as directed by your health care provider. What should I know about how menopause affects my mental health? Depression may occur at any age, but it is more common as you become older. Common symptoms of depression include:  Low or sad mood.  Changes in sleep patterns.  Changes in appetite or eating patterns.  Feeling an overall lack of motivation or enjoyment of activities that you previously enjoyed.  Frequent crying spells.  Talk with your health care provider if you think that you are experiencing depression. What should I  know about immunizations? It is important that you get and maintain your immunizations. These include:  Tetanus, diphtheria, and pertussis (Tdap) booster vaccine.  Influenza every year before the flu season begins.  Pneumonia vaccine.  Shingles vaccine.  Your health care provider may also recommend other immunizations. This information is not intended to replace advice given to you by your health care provider. Make sure you discuss any questions you have with your health care provider. Document Released: 04/27/2005 Document Revised: 09/23/2015 Document Reviewed: 12/07/2014 Elsevier Interactive Patient Education  2018 [employer].

## 2017-07-10 ENCOUNTER — Ambulatory Visit (INDEPENDENT_AMBULATORY_CARE_PROVIDER_SITE_OTHER): Payer: Medicare Other | Admitting: Family Medicine

## 2017-07-10 ENCOUNTER — Encounter: Payer: Self-pay | Admitting: Family Medicine

## 2017-07-10 VITALS — BP 120/70 | HR 106 | Temp 98.2°F | Ht 65.0 in | Wt 108.2 lb

## 2017-07-10 DIAGNOSIS — R000 Tachycardia, unspecified: Secondary | ICD-10-CM | POA: Diagnosis not present

## 2017-07-10 DIAGNOSIS — R251 Tremor, unspecified: Secondary | ICD-10-CM | POA: Diagnosis not present

## 2017-07-10 DIAGNOSIS — F17210 Nicotine dependence, cigarettes, uncomplicated: Secondary | ICD-10-CM

## 2017-07-10 DIAGNOSIS — E039 Hypothyroidism, unspecified: Secondary | ICD-10-CM | POA: Insufficient documentation

## 2017-07-10 DIAGNOSIS — F411 Generalized anxiety disorder: Secondary | ICD-10-CM

## 2017-07-10 DIAGNOSIS — Z1239 Encounter for other screening for malignant neoplasm of breast: Secondary | ICD-10-CM

## 2017-07-10 DIAGNOSIS — Z1231 Encounter for screening mammogram for malignant neoplasm of breast: Secondary | ICD-10-CM

## 2017-07-10 DIAGNOSIS — Z23 Encounter for immunization: Secondary | ICD-10-CM

## 2017-07-10 DIAGNOSIS — Z1159 Encounter for screening for other viral diseases: Secondary | ICD-10-CM

## 2017-07-10 DIAGNOSIS — E890 Postprocedural hypothyroidism: Secondary | ICD-10-CM | POA: Diagnosis not present

## 2017-07-10 DIAGNOSIS — D531 Other megaloblastic anemias, not elsewhere classified: Secondary | ICD-10-CM | POA: Diagnosis not present

## 2017-07-10 MED ORDER — PROPRANOLOL HCL 20 MG PO TABS: 20.0000 mg | ORAL_TABLET | Freq: Three times a day (TID) | ORAL | 2 refills | Status: DC

## 2017-07-10 MED ORDER — BUSPIRONE HCL 10 MG PO TABS: 10.0000 mg | ORAL_TABLET | Freq: Three times a day (TID) | ORAL | 3 refills | Status: DC

## 2017-07-10 NOTE — Progress Notes (Signed)
CC: Anxiety  Subjective Andrea Ward presents for f/u anxiety.  She is currently being treated with BuSpar 10 mg tid.  Reports 75% improvement since treatment. No thoughts of harming self or others. No self-medication with alcohol, prescription drugs or illicit drugs. Pt is not following with a counselor/psychologist.  Has been having an intention tremor when she writes. Thinks it is due to nerves, but daughter is concerned. Not worsening, no weakness. It is bothering her enough to start a medicine.   She continues to smoke around 1/2 ppd, has been smoking for around 50 yrs. Was smoking 1 ppd 1 year ago, did not have her low dose CT done. She is interested in quitting, not interested in taking any medication or supplement to help her quit.   ROS Psych: +anxiety Neuro: +tremor  Past Medical History:  Diagnosis Date  . Generalized anxiety disorder 05/23/2016  . GERD (gastroesophageal reflux disease) 05/23/2016  . Hypothyroid    Allergies as of 07/10/2017   No Known Allergies     Medication List        Accurate as of 07/10/17  4:37 PM. Always use your most recent med list.          busPIRone 10 MG tablet Commonly known as:  BUSPAR Take 1 tablet (10 mg total) by mouth 3 (three) times daily. Needs appointment for further refills.   calcitRIOL 0.25 MCG capsule Commonly known as:  ROCALTROL Take 0.25 mcg by mouth daily.   calcium carbonate 1250 (500 Ca) MG tablet Commonly known as:  OS-CAL - dosed in mg of elemental calcium Take 2 tablets by mouth 3 (three) times daily.   levothyroxine 50 MCG tablet Commonly known as:  SYNTHROID, LEVOTHROID Take 50 mcg by mouth daily before breakfast.   levothyroxine 75 MCG tablet Commonly known as:  SYNTHROID, LEVOTHROID Take 75 mcg by mouth daily.   propranolol 20 MG tablet Commonly known as:  INDERAL Take 1 tablet (20 mg total) by mouth 3 (three) times daily.       Exam BP 120/70 (BP Location: Left Arm, Patient Position:  Sitting, Cuff Size: Normal)   Pulse (!) 106   Temp 98.2 F (36.8 C) (Oral)   Ht 5\' 5"  (1.651 m)   Wt 108 lb 4 oz (49.1 kg)   SpO2 97%   BMI 18.01 kg/m  General:  well developed, well nourished, in no apparent distress Neck: neck supple without adenopathy, thyromegaly, or masses Lungs:  clear to auscultation, breath sounds equal bilaterally, no respiratory distress Cardio: tachycardic regular rhythm without murmurs, heart sounds without clicks or rubs Neuro: Intention tremor noted, no resting tremor appreciated, no cerebellar signs Psych: well oriented with normal range of affect and age-appropriate judgement/insight, alert and oriented x4.  Assessment and Plan  Generalized anxiety disorder - Plan: busPIRone (BUSPAR) 10 MG tablet  Postoperative hypothyroidism  Tachycardia - Plan: CBC w/Diff  Tremor - Plan: propranolol (INDERAL) 20 MG tablet  Smokes with greater than 30 pack year history - Plan: CT CHEST LUNG CANCER SCREENING LOW DOSE WO CONTRAST  Screening for breast cancer - Plan: MM DIGITAL SCREENING BILATERAL  Encounter for hepatitis C screening test for low risk patient - Plan: Hepatitis C antibody  Need for vaccination against Streptococcus pneumoniae - Plan: Pneumococcal polysaccharide vaccine 23-valent greater than or equal to 2yo subcutaneous/IM  Orders as above. Refill, will not change dose at pt's request. [facility] info provided.  Start propranolol. Will consider Neuro referral if no improvement. Ck cbc given HR,  could be due to anxiety. Propranolol may have a 3 fold benefit with anxiety, HR and tremor.  Shingrix rec'd, call pharmacy. F/u in 6 weeks. The patient and her daughter voiced understanding and agreement to the plan.  [place], DO 07/10/17 4:37 PM

## 2017-07-10 NOTE — Progress Notes (Signed)
Pre visit review using our clinic review tool, if applicable. No additional management support is needed unless otherwise documented below in the visit note. 

## 2017-07-10 NOTE — Patient Instructions (Addendum)
Please consider counseling. Contact [phone] to schedule an appointment or inquire about cost/insurance coverage.  Stop smoking.  If you do not hear anything about your referral in the next 1-2 weeks, call our office and ask for an update.  Call your pharmacy to see about the availability of the new shingles vaccine (Shingrix).  Let us know if you need anything.

## 2017-07-11 LAB — CBC WITH DIFFERENTIAL/PLATELET
BASOS PCT: 1.3 % (ref 0.0–3.0)
Basophils Absolute: 0 10*3/uL (ref 0.0–0.1)
EOS ABS: 0.1 10*3/uL (ref 0.0–0.7)
EOS PCT: 2 % (ref 0.0–5.0)
HEMATOCRIT: 40.7 % (ref 36.0–46.0)
Hemoglobin: 13.9 g/dL (ref 12.0–15.0)
Lymphocytes Relative: 23.2 % (ref 12.0–46.0)
Lymphs Abs: 0.9 10*3/uL (ref 0.7–4.0)
MCHC: 34.1 g/dL (ref 30.0–36.0)
MCV: 120.9 fl — ABNORMAL HIGH (ref 78.0–100.0)
MONO ABS: 0.4 10*3/uL (ref 0.1–1.0)
Monocytes Relative: 11.8 % (ref 3.0–12.0)
NEUTROS ABS: 2.3 10*3/uL (ref 1.4–7.7)
Neutrophils Relative %: 61.7 % (ref 43.0–77.0)
PLATELETS: 297 10*3/uL (ref 150.0–400.0)
RBC: 3.37 Mil/uL — ABNORMAL LOW (ref 3.87–5.11)
RDW: 13.9 % (ref 11.5–15.5)
WBC: 3.7 10*3/uL — ABNORMAL LOW (ref 4.0–10.5)

## 2017-07-11 LAB — HEPATITIS C ANTIBODY
HEP C AB: NONREACTIVE
SIGNAL TO CUT-OFF: 0.09 (ref <1.00)

## 2017-07-12 ENCOUNTER — Ambulatory Visit: Payer: Medicare Other | Admitting: Family Medicine

## 2017-07-15 ENCOUNTER — Ambulatory Visit (HOSPITAL_BASED_OUTPATIENT_CLINIC_OR_DEPARTMENT_OTHER)
Admission: RE | Admit: 2017-07-15 | Discharge: 2017-07-15 | Disposition: A | Discharge status: Home / Self Care | Payer: Medicare Other | Source: Ambulatory Visit | Attending: Family Medicine | Admitting: Family Medicine

## 2017-07-15 DIAGNOSIS — I2510 Atherosclerotic heart disease of native coronary artery without angina pectoris: Secondary | ICD-10-CM | POA: Diagnosis not present

## 2017-07-15 DIAGNOSIS — Z1231 Encounter for screening mammogram for malignant neoplasm of breast: Secondary | ICD-10-CM | POA: Insufficient documentation

## 2017-07-15 DIAGNOSIS — J432 Centrilobular emphysema: Secondary | ICD-10-CM | POA: Diagnosis not present

## 2017-07-15 DIAGNOSIS — Z1239 Encounter for other screening for malignant neoplasm of breast: Secondary | ICD-10-CM

## 2017-07-15 DIAGNOSIS — J438 Other emphysema: Secondary | ICD-10-CM | POA: Insufficient documentation

## 2017-07-15 DIAGNOSIS — F17210 Nicotine dependence, cigarettes, uncomplicated: Secondary | ICD-10-CM | POA: Insufficient documentation

## 2017-07-15 DIAGNOSIS — I700 Atherosclerosis of aorta: Secondary | ICD-10-CM | POA: Diagnosis not present

## 2017-07-15 DIAGNOSIS — Z122 Encounter for screening for malignant neoplasm of respiratory organs: Secondary | ICD-10-CM | POA: Diagnosis not present

## 2017-07-15 LAB — B12 AND FOLATE PANEL: Vitamin B-12: 531 pg/mL (ref 200–1100)

## 2017-07-17 ENCOUNTER — Other Ambulatory Visit: Payer: Self-pay | Admitting: Family Medicine

## 2017-07-17 DIAGNOSIS — K769 Liver disease, unspecified: Secondary | ICD-10-CM

## 2017-07-17 NOTE — Progress Notes (Signed)
Spoke w pt, ordered MRI per rec and informed pt.

## 2017-07-22 ENCOUNTER — Other Ambulatory Visit: Payer: Self-pay | Admitting: Family Medicine

## 2017-07-22 DIAGNOSIS — D539 Nutritional anemia, unspecified: Secondary | ICD-10-CM

## 2017-07-22 DIAGNOSIS — K769 Liver disease, unspecified: Secondary | ICD-10-CM

## 2017-07-30 ENCOUNTER — Other Ambulatory Visit (INDEPENDENT_AMBULATORY_CARE_PROVIDER_SITE_OTHER): Payer: Medicare Other

## 2017-07-30 DIAGNOSIS — K769 Liver disease, unspecified: Secondary | ICD-10-CM | POA: Diagnosis not present

## 2017-07-30 DIAGNOSIS — D539 Nutritional anemia, unspecified: Secondary | ICD-10-CM

## 2017-07-30 LAB — B12 AND FOLATE PANEL
FOLATE: 3.5 ng/mL — AB (ref >5.9)
VITAMIN B 12: 202 pg/mL — AB (ref 211–911)

## 2017-07-30 LAB — BASIC METABOLIC PANEL
BUN: 7 mg/dL (ref 6–23)
CALCIUM: 6.1 mg/dL — AB (ref 8.4–10.5)
CO2: 32 mEq/L (ref 19–32)
Chloride: 104 mEq/L (ref 96–112)
Creatinine, Ser: 0.74 mg/dL (ref 0.40–1.20)
GFR: 99.73 mL/min (ref >60.00)
GLUCOSE: 113 mg/dL — AB (ref 70–99)
Potassium: 5.6 mEq/L — ABNORMAL HIGH (ref 3.5–5.1)
SODIUM: 146 meq/L — AB (ref 135–145)

## 2017-07-31 ENCOUNTER — Other Ambulatory Visit: Payer: Self-pay | Admitting: Family Medicine

## 2017-07-31 DIAGNOSIS — E875 Hyperkalemia: Secondary | ICD-10-CM

## 2017-07-31 DIAGNOSIS — E538 Deficiency of other specified B group vitamins: Secondary | ICD-10-CM

## 2017-08-02 ENCOUNTER — Other Ambulatory Visit (INDEPENDENT_AMBULATORY_CARE_PROVIDER_SITE_OTHER): Payer: Medicare Other

## 2017-08-02 ENCOUNTER — Encounter: Payer: Self-pay | Admitting: Family Medicine

## 2017-08-02 DIAGNOSIS — E875 Hyperkalemia: Secondary | ICD-10-CM

## 2017-08-02 LAB — COMPREHENSIVE METABOLIC PANEL
ALBUMIN: 3.3 g/dL — AB (ref 3.5–5.2)
ALK PHOS: 92 U/L (ref 39–117)
ALT: 7 U/L (ref 0–35)
AST: 25 U/L (ref 0–37)
BUN: 5 mg/dL — AB (ref 6–23)
CO2: 33 mEq/L — ABNORMAL HIGH (ref 19–32)
Calcium: 5.5 mg/dL — ABNORMAL LOW (ref 8.4–10.5)
Chloride: 100 mEq/L (ref 96–112)
Creatinine, Ser: 0.74 mg/dL (ref 0.40–1.20)
GFR: 99.73 mL/min (ref >60.00)
Glucose, Bld: 92 mg/dL (ref 70–99)
POTASSIUM: 4.2 meq/L (ref 3.5–5.1)
Sodium: 141 mEq/L (ref 135–145)
TOTAL PROTEIN: 7.3 g/dL (ref 6.0–8.3)
Total Bilirubin: 0.5 mg/dL (ref 0.2–1.2)

## 2017-08-03 ENCOUNTER — Ambulatory Visit (HOSPITAL_BASED_OUTPATIENT_CLINIC_OR_DEPARTMENT_OTHER)
Admission: RE | Admit: 2017-08-03 | Discharge: 2017-08-03 | Disposition: A | Discharge status: Home / Self Care | Payer: Medicare Other | Source: Ambulatory Visit | Attending: Family Medicine | Admitting: Family Medicine

## 2017-08-03 DIAGNOSIS — K769 Liver disease, unspecified: Secondary | ICD-10-CM | POA: Diagnosis not present

## 2017-08-03 DIAGNOSIS — K7689 Other specified diseases of liver: Secondary | ICD-10-CM | POA: Diagnosis not present

## 2017-08-03 MED ORDER — GADOBENATE DIMEGLUMINE 529 MG/ML IV SOLN
10.0000 mL | Freq: Once | INTRAVENOUS | Status: AC | PRN
  Administered 2017-08-03: 10 mL via INTRAVENOUS

## 2017-08-19 ENCOUNTER — Encounter: Payer: Self-pay | Admitting: Family Medicine

## 2017-08-19 ENCOUNTER — Ambulatory Visit (INDEPENDENT_AMBULATORY_CARE_PROVIDER_SITE_OTHER): Payer: Medicare Other | Admitting: Family Medicine

## 2017-08-19 VITALS — BP 128/84 | HR 100 | Temp 98.2°F | Ht 65.0 in | Wt 110.4 lb

## 2017-08-19 DIAGNOSIS — R251 Tremor, unspecified: Secondary | ICD-10-CM | POA: Insufficient documentation

## 2017-08-19 NOTE — Progress Notes (Signed)
Chief Complaint  Patient presents with  . Follow-up    Subjective: Patient is a 72 y.o. female here for f/u tremor. Here w daughter.  She was seen around 6 weeks ago and started on propranolol. Doing around 90% better, tolerating well. No concerns, would like to leave dose alone. It has helped w anxiety also.    ROS: Heart: Denies palpitations  Past Medical History:  Diagnosis Date  . Generalized anxiety disorder 05/23/2016  . GERD (gastroesophageal reflux disease) 05/23/2016  . 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 Hypothyroid    Objective: BP 128/84 (BP Location: Left Arm)   Pulse 100   Temp 98.2 F (36.8 C) (Oral)   Ht 5\' 5"  (1.651 m)   Wt 110 lb 6 oz (50.1 kg)   SpO2 93%   BMI 18.37 kg/m  General: Awake, appears stated age Heart: RRR Lungs: CTAB, no rales, wheezes or rhonchi. No accessory muscle use Neuro: No resting tremor, mild intention tremor in hands, worse on L.  Psych: Age appropriate judgment and insight, normal affect and mood  Assessment and Plan: Tremor  Tremor, anxiety and HR (during my exam) had improved. No changes per pt request. F/u in 6 mo. The patient voiced understanding and agreement to the plan.  [place], DO 08/19/17  10:56 AM

## 2017-08-19 NOTE — Patient Instructions (Signed)
Stop smoking!  It sounds like you are doing well on your medicine. Let me know if anything changes.  Let us know if you need anything.

## 2017-08-19 NOTE — Progress Notes (Signed)
Pre visit review using our clinic review tool, if applicable. No additional management support is needed unless otherwise documented below in the visit note. 

## 2017-08-26 DIAGNOSIS — E892 Postprocedural hypoparathyroidism: Secondary | ICD-10-CM | POA: Diagnosis not present

## 2017-08-26 DIAGNOSIS — E890 Postprocedural hypothyroidism: Secondary | ICD-10-CM | POA: Diagnosis not present

## 2017-11-23 DIAGNOSIS — R251 Tremor, unspecified: Secondary | ICD-10-CM | POA: Diagnosis not present

## 2017-11-23 DIAGNOSIS — S299XXA Unspecified injury of thorax, initial encounter: Secondary | ICD-10-CM | POA: Diagnosis not present

## 2017-11-23 DIAGNOSIS — R531 Weakness: Secondary | ICD-10-CM | POA: Diagnosis not present

## 2017-11-23 DIAGNOSIS — R000 Tachycardia, unspecified: Secondary | ICD-10-CM | POA: Diagnosis not present

## 2017-11-23 DIAGNOSIS — R0902 Hypoxemia: Secondary | ICD-10-CM | POA: Diagnosis not present

## 2017-11-24 DIAGNOSIS — E890 Postprocedural hypothyroidism: Secondary | ICD-10-CM | POA: Diagnosis not present

## 2017-11-24 DIAGNOSIS — R000 Tachycardia, unspecified: Secondary | ICD-10-CM | POA: Diagnosis not present

## 2017-11-24 DIAGNOSIS — F17200 Nicotine dependence, unspecified, uncomplicated: Secondary | ICD-10-CM | POA: Diagnosis not present

## 2017-11-24 DIAGNOSIS — R531 Weakness: Secondary | ICD-10-CM | POA: Diagnosis not present

## 2017-11-24 DIAGNOSIS — J439 Emphysema, unspecified: Secondary | ICD-10-CM | POA: Diagnosis not present

## 2017-11-24 DIAGNOSIS — R5381 Other malaise: Secondary | ICD-10-CM | POA: Diagnosis not present

## 2017-11-24 DIAGNOSIS — E872 Acidosis: Secondary | ICD-10-CM | POA: Diagnosis not present

## 2017-11-25 DIAGNOSIS — R9431 Abnormal electrocardiogram [ECG] [EKG]: Secondary | ICD-10-CM | POA: Diagnosis not present

## 2017-11-25 DIAGNOSIS — R000 Tachycardia, unspecified: Secondary | ICD-10-CM | POA: Diagnosis not present

## 2017-11-26 DIAGNOSIS — E878 Other disorders of electrolyte and fluid balance, not elsewhere classified: Secondary | ICD-10-CM | POA: Diagnosis not present

## 2017-11-26 DIAGNOSIS — J441 Chronic obstructive pulmonary disease with (acute) exacerbation: Secondary | ICD-10-CM | POA: Diagnosis not present

## 2017-11-26 DIAGNOSIS — R531 Weakness: Secondary | ICD-10-CM | POA: Diagnosis not present

## 2017-11-26 DIAGNOSIS — E890 Postprocedural hypothyroidism: Secondary | ICD-10-CM | POA: Diagnosis not present

## 2017-11-26 DIAGNOSIS — R9431 Abnormal electrocardiogram [ECG] [EKG]: Secondary | ICD-10-CM | POA: Diagnosis not present

## 2017-11-26 DIAGNOSIS — R451 Restlessness and agitation: Secondary | ICD-10-CM | POA: Diagnosis not present

## 2017-11-26 DIAGNOSIS — E872 Acidosis: Secondary | ICD-10-CM | POA: Diagnosis not present

## 2017-11-26 DIAGNOSIS — E892 Postprocedural hypoparathyroidism: Secondary | ICD-10-CM | POA: Diagnosis not present

## 2017-11-27 DIAGNOSIS — J441 Chronic obstructive pulmonary disease with (acute) exacerbation: Secondary | ICD-10-CM | POA: Diagnosis not present

## 2017-11-27 DIAGNOSIS — R531 Weakness: Secondary | ICD-10-CM | POA: Diagnosis not present

## 2017-11-27 DIAGNOSIS — E872 Acidosis: Secondary | ICD-10-CM | POA: Diagnosis not present

## 2017-11-27 DIAGNOSIS — R451 Restlessness and agitation: Secondary | ICD-10-CM | POA: Diagnosis not present

## 2017-11-27 DIAGNOSIS — E890 Postprocedural hypothyroidism: Secondary | ICD-10-CM | POA: Diagnosis not present

## 2017-11-27 DIAGNOSIS — E892 Postprocedural hypoparathyroidism: Secondary | ICD-10-CM | POA: Diagnosis not present

## 2017-11-27 DIAGNOSIS — E878 Other disorders of electrolyte and fluid balance, not elsewhere classified: Secondary | ICD-10-CM | POA: Diagnosis not present

## 2017-11-27 DIAGNOSIS — R9431 Abnormal electrocardiogram [ECG] [EKG]: Secondary | ICD-10-CM | POA: Diagnosis not present

## 2017-11-28 DIAGNOSIS — E878 Other disorders of electrolyte and fluid balance, not elsewhere classified: Secondary | ICD-10-CM | POA: Diagnosis not present

## 2017-11-28 DIAGNOSIS — E892 Postprocedural hypoparathyroidism: Secondary | ICD-10-CM | POA: Diagnosis not present

## 2017-11-28 DIAGNOSIS — R451 Restlessness and agitation: Secondary | ICD-10-CM | POA: Diagnosis not present

## 2017-11-28 DIAGNOSIS — R531 Weakness: Secondary | ICD-10-CM | POA: Diagnosis not present

## 2017-11-28 DIAGNOSIS — E872 Acidosis: Secondary | ICD-10-CM | POA: Diagnosis not present

## 2017-11-28 DIAGNOSIS — E890 Postprocedural hypothyroidism: Secondary | ICD-10-CM | POA: Diagnosis not present

## 2017-11-28 DIAGNOSIS — J441 Chronic obstructive pulmonary disease with (acute) exacerbation: Secondary | ICD-10-CM | POA: Diagnosis not present

## 2017-11-28 DIAGNOSIS — R9431 Abnormal electrocardiogram [ECG] [EKG]: Secondary | ICD-10-CM | POA: Diagnosis not present

## 2017-11-29 DIAGNOSIS — E878 Other disorders of electrolyte and fluid balance, not elsewhere classified: Secondary | ICD-10-CM | POA: Diagnosis not present

## 2017-11-30 ENCOUNTER — Other Ambulatory Visit: Payer: Self-pay | Admitting: Family Medicine

## 2017-11-30 DIAGNOSIS — E878 Other disorders of electrolyte and fluid balance, not elsewhere classified: Secondary | ICD-10-CM | POA: Diagnosis not present

## 2017-11-30 DIAGNOSIS — R251 Tremor, unspecified: Secondary | ICD-10-CM

## 2017-12-02 MED ORDER — IRON PO
1.00 | ORAL | Status: DC
Start: 2017-12-01 — End: 2017-12-02

## 2017-12-02 MED ORDER — ALBUTEROL SULFATE (5 MG/ML) 0.5% IN NEBU
2.50 | INHALATION_SOLUTION | RESPIRATORY_TRACT | Status: DC
Start: 2017-11-30 — End: 2017-12-02

## 2017-12-02 MED ORDER — LORAZEPAM 2 MG/ML IJ SOLN
1.00 | INTRAMUSCULAR | Status: DC
Start: ? — End: 2017-12-02

## 2017-12-02 MED ORDER — IPRATROPIUM BROMIDE 0.02 % IN SOLN
0.50 | RESPIRATORY_TRACT | Status: DC
Start: 2017-11-30 — End: 2017-12-02

## 2017-12-02 MED ORDER — PROPRANOLOL HCL 10 MG PO TABS
20.00 | ORAL_TABLET | ORAL | Status: DC
Start: 2017-11-30 — End: 2017-12-02

## 2017-12-02 MED ORDER — ALBUTEROL SULFATE (5 MG/ML) 0.5% IN NEBU
2.50 | INHALATION_SOLUTION | RESPIRATORY_TRACT | Status: DC
Start: ? — End: 2017-12-02

## 2017-12-02 MED ORDER — CALCITRIOL 0.25 MCG PO CAPS
0.50 | ORAL_CAPSULE | ORAL | Status: DC
Start: 2017-11-30 — End: 2017-12-02

## 2017-12-02 MED ORDER — FAMOTIDINE 20 MG PO TABS
20.00 | ORAL_TABLET | ORAL | Status: DC
Start: 2017-11-30 — End: 2017-12-02

## 2017-12-02 MED ORDER — VITAMIN B-12 1000 MCG PO TABS
1000.00 | ORAL_TABLET | ORAL | Status: DC
Start: 2017-12-01 — End: 2017-12-02

## 2017-12-02 MED ORDER — GENERIC EXTERNAL MEDICATION
3750.00 | Status: DC
Start: 2017-11-30 — End: 2017-12-02

## 2017-12-02 MED ORDER — DIAZEPAM 5 MG PO TABS
5.00 | ORAL_TABLET | ORAL | Status: DC
Start: ? — End: 2017-12-02

## 2017-12-02 MED ORDER — ONDANSETRON HCL 4 MG/2ML IJ SOLN
4.00 | INTRAMUSCULAR | Status: DC
Start: ? — End: 2017-12-02

## 2017-12-02 MED ORDER — MAGNESIUM OXIDE 400 MG PO TABS
400.00 | ORAL_TABLET | ORAL | Status: DC
Start: 2017-11-30 — End: 2017-12-02

## 2017-12-02 MED ORDER — GABAPENTIN 100 MG PO CAPS
100.00 | ORAL_CAPSULE | ORAL | Status: DC
Start: 2017-11-30 — End: 2017-12-02

## 2017-12-02 MED ORDER — IPRATROPIUM BROMIDE 0.02 % IN SOLN
0.50 | RESPIRATORY_TRACT | Status: DC
Start: ? — End: 2017-12-02

## 2017-12-02 MED ORDER — BENZONATATE 100 MG PO CAPS
100.00 | ORAL_CAPSULE | ORAL | Status: DC
Start: ? — End: 2017-12-02

## 2017-12-02 MED ORDER — NICOTINE 21 MG/24HR TD PT24
1.00 | MEDICATED_PATCH | TRANSDERMAL | Status: DC
Start: 2017-12-01 — End: 2017-12-02

## 2017-12-02 MED ORDER — FOLIC ACID 400 MCG PO TABS
400.00 | ORAL_TABLET | ORAL | Status: DC
Start: 2017-12-01 — End: 2017-12-02

## 2017-12-02 MED ORDER — LEVOTHYROXINE SODIUM 50 MCG PO TABS
50.00 | ORAL_TABLET | ORAL | Status: DC
Start: 2017-12-01 — End: 2017-12-02

## 2017-12-02 MED ORDER — ALBUTEROL SULFATE HFA 108 (90 BASE) MCG/ACT IN AERS
2.00 | INHALATION_SPRAY | RESPIRATORY_TRACT | Status: DC
Start: ? — End: 2017-12-02

## 2017-12-02 MED ORDER — THIAMINE HCL 100 MG PO TABS
100.00 | ORAL_TABLET | ORAL | Status: DC
Start: 2017-12-01 — End: 2017-12-02

## 2017-12-02 MED ORDER — ENOXAPARIN SODIUM 40 MG/0.4ML ~~LOC~~ SOLN
40.00 | SUBCUTANEOUS | Status: DC
Start: 2017-11-30 — End: 2017-12-02

## 2017-12-09 ENCOUNTER — Telehealth: Payer: Self-pay

## 2017-12-09 NOTE — Telephone Encounter (Signed)
Chief Strategy Officer phoned daughter to address concerns. Daughter states pt. has been drinking alcohol and not taking her medications as prescribed. Pt. seen for electrolyte imbalance in ED on 11/23/17. Daughter states pt. does not have good insight or judgment, worsened with alcohol abuse, including rum and wine. Daughter states "she takes buspar like tic-tacs". Daughter concerned about early dementia as well. Routed to Dr. Nani Ravens to address at 9/26 visit.   Copied from [place] [id number]. Topic: Inquiry >> Dec 09, 2017 11:35 AM Scherrie Gerlach wrote: Reason for CRM: daughter calling to ask if someone can call her prior to pt's hosp fup on Thursday 9/26.  She has some concerns and does not want to discuss in front of the pt.

## 2017-12-09 NOTE — Telephone Encounter (Signed)
Noted. Will discuss options at appt. TY.

## 2017-12-12 ENCOUNTER — Encounter: Payer: Self-pay | Admitting: Family Medicine

## 2017-12-12 ENCOUNTER — Ambulatory Visit (INDEPENDENT_AMBULATORY_CARE_PROVIDER_SITE_OTHER): Payer: Medicare Other | Admitting: Family Medicine

## 2017-12-12 DIAGNOSIS — Z09 Encounter for follow-up examination after completed treatment for conditions other than malignant neoplasm: Secondary | ICD-10-CM | POA: Diagnosis not present

## 2017-12-12 DIAGNOSIS — J439 Emphysema, unspecified: Secondary | ICD-10-CM | POA: Diagnosis not present

## 2017-12-12 LAB — COMPREHENSIVE METABOLIC PANEL
ALT: 7 U/L (ref 0–35)
AST: 18 U/L (ref 0–37)
Albumin: 3.3 g/dL — ABNORMAL LOW (ref 3.5–5.2)
Alkaline Phosphatase: 65 U/L (ref 39–117)
BUN: 7 mg/dL (ref 6–23)
CALCIUM: 8.6 mg/dL (ref 8.4–10.5)
CO2: 32 mEq/L (ref 19–32)
Chloride: 101 mEq/L (ref 96–112)
Creatinine, Ser: 0.9 mg/dL (ref 0.40–1.20)
GFR: 79.48 mL/min (ref >60.00)
Glucose, Bld: 128 mg/dL — ABNORMAL HIGH (ref 70–99)
POTASSIUM: 4.8 meq/L (ref 3.5–5.1)
Sodium: 141 mEq/L (ref 135–145)
TOTAL PROTEIN: 6.8 g/dL (ref 6.0–8.3)
Total Bilirubin: 0.5 mg/dL (ref 0.2–1.2)

## 2017-12-12 MED ORDER — UMECLIDINIUM BROMIDE 62.5 MCG/INH IN AEPB: 1.0000 | INHALATION_SPRAY | Freq: Every day | RESPIRATORY_TRACT | 1 refills | Status: DC

## 2017-12-12 NOTE — Patient Instructions (Addendum)
Give [place] 2-3 business days to get the results of your labs back.   Goodrx is the app/website to use to help find cheaper medicines at various pharmacies.   Inhalers- Spiriva Tudorza Incruse Check out Incruse for this.  Stay away from alcohol and cigarettes.  If medication is too expensive, let me know. We might need to see lung specialist to help get medications at affordable rate.  Let us know if you need anything.

## 2017-12-12 NOTE — Progress Notes (Signed)
Chief Complaint  Patient presents with  . Hospitalization Follow-up    Pt here for hosp f/u visit 11/23/17.     Subjective: Patient is a 72 y.o. female here for hosp f/u.  Patient was admitted on 9/7-9/14 at [facility] hospital for hypocalcemia secondary to thyroidectomy/parathyroidectomy.  She was not taking the correct dose of her calcitriol.  Her electro lites were corrected.  During her stay, she started becoming agitated and a CIWA scale was started for acute alcohol withdrawal.  The patient reports every other day drinking of a Styrofoam cup of wine.  She will intermittently drink Bacardi rum.  The patient reports she is neither smokes nor drink alcohol since her discharge almost 2 weeks ago.  Her daughter is here with her and is concerned for dementia and her alcohol use.  The patient reports she is taking her medications as needed.  Her anxiety symptoms are well controlled on BuSpar and propranolol.  She denies any side effects.  The patient does not believe she is having any issues with dementia.    She was diagnosed with emphysema and did have treatment while in the hospital.  She was discharged on albuterol and Breo.  She reports improvement since starting these.   ROS: Heart: Denies chest pain  Lungs: Denies SOB   Past Medical History:  Diagnosis Date  . Generalized anxiety disorder 05/23/2016  . GERD (gastroesophageal reflux disease) 05/23/2016  . Hypoparathyroidism ([place])    Acquired post surg  . Hypothyroid     Objective: BP (!) 146/61 (BP Location: Right Arm, Patient Position: Sitting, Cuff Size: Normal)   Pulse 85   Temp 98.1 F (36.7 C) (Oral)   Ht 5\' 5"  (1.651 m)   Wt 110 lb (49.9 kg)   SpO2 98%   BMI 18.30 kg/m  General: Awake, appears stated age HEENT: MMM, EOMi Heart: RRR, no bruits, no lower extremity edema Lungs: CTAB, no rales, wheezes or rhonchi. No accessory muscle use Neuro: A&Ox4, fluent and goal-oriented speech, no clonus Psych: Age  appropriate judgment and insight, normal affect and mood  Assessment and Plan: Hypocalcemia - Plan: Comprehensive metabolic panel, CANCELED: Basic metabolic panel  Pulmonary emphysema, unspecified emphysema type ([place]) - Plan: umeclidinium bromide (INCRUSE ELLIPTA) 62.5 MCG/INH [facility] discharge follow-up  We will check a calcium and liver panel today.  Continue albuterol and Breo for now.  I will add a long-acting muscarinic antagonist.  She was commended for her smoking cessation.  Good Rx recommended for an app to compare prices.  I gave alternatives to Incruse on the paperwork to shop around as well. Also recommended continuing with alcohol cessation. This was a very interesting patient encounter because her daughter is saying one thing but the patient is seeing the opposite.  The patient is alert and oriented x4. I will plan to see her in 4 weeks to see how her breathing is doing.  If she requires pulmonology referral, she will cancel this appointment. The patient and her daughter voiced understanding and agreement to the plan.   Greater than 40 minutes were spent face to face with the patient with greater than 50% of this time spent counseling on inhalers for COPD/emphysema, cost of inhalers, follow up, anxiety treatment, alcohol use/misuse, hypocalcemia and specialty follow up.     [place], DO 12/12/17  12:44 PM

## 2018-01-09 ENCOUNTER — Ambulatory Visit (INDEPENDENT_AMBULATORY_CARE_PROVIDER_SITE_OTHER): Payer: Medicare Other | Admitting: Family Medicine

## 2018-01-09 ENCOUNTER — Encounter: Payer: Self-pay | Admitting: Family Medicine

## 2018-01-09 VITALS — BP 122/80 | HR 83 | Temp 97.7°F | Ht 64.0 in | Wt 111.2 lb

## 2018-01-09 DIAGNOSIS — J439 Emphysema, unspecified: Secondary | ICD-10-CM | POA: Diagnosis not present

## 2018-01-09 MED ORDER — CALCITRIOL 0.25 MCG PO CAPS: 0.2500 ug | ORAL_CAPSULE | Freq: Every day | ORAL | 0 refills | Status: DC

## 2018-01-09 NOTE — Progress Notes (Signed)
Pre visit review using our clinic review tool, if applicable. No additional management support is needed unless otherwise documented below in the visit note. 

## 2018-01-09 NOTE — Patient Instructions (Addendum)
Give [place] several business days to reach out to pharmacy reps to see about covering the inhaler. We may have to refer you to a lung doctor due to this.  Look into getting Medicare Part D in the meanwhile.   Let us know if you need anything.

## 2018-01-09 NOTE — Progress Notes (Signed)
Chief Complaint  Patient presents with  . Breathing Problem    Subjective: Patient is a 72 y.o. female here for breathing ck.  Started on Incruse at last visit and told to take [facility] consistently. Just picked up Incruse, cost around $329, which is not feasible for pt long term. She feels much better since taking inhaler daily. No AE's. Coughing much less, not using rescue inhaler. Still abstaining from smoking.   ROS: Heart: Denies chest pain  Lungs: Denies SOB   Past Medical History:  Diagnosis Date  . Generalized anxiety disorder 05/23/2016  . GERD (gastroesophageal reflux disease) 05/23/2016  . Hypoparathyroidism ([place])    Acquired post surg  . Hypothyroid     Objective: BP 122/80 (BP Location: Left Arm, Patient Position: Sitting, Cuff Size: Normal)   Pulse 83   Temp 97.7 F (36.5 C) (Oral)   Ht 5\' 4"  (1.626 m)   Wt 111 lb 4 oz (50.5 kg)   SpO2 94%   BMI 19.10 kg/m  General: Awake, appears stated age Heart: RRR, no LE edema Lungs: CTAB, no rales, wheezes or rhonchi. No accessory muscle use Psych: Age appropriate judgment and insight, normal affect and mood  Assessment and Plan: Pulmonary emphysema, unspecified emphysema type ([place])  Will look into PAP and have her look into Medicare Pt D.  May have to refer to pulm with their stock of samples? F/u in 3 mo, cancel 12/2 visit.  The patient voiced understanding and agreement to the plan.  [place], DO 01/09/18  11:14 AM

## 2018-02-17 ENCOUNTER — Ambulatory Visit: Payer: Medicare Other | Admitting: Family Medicine

## 2018-02-25 DIAGNOSIS — E892 Postprocedural hypoparathyroidism: Secondary | ICD-10-CM | POA: Diagnosis not present

## 2018-02-25 DIAGNOSIS — E890 Postprocedural hypothyroidism: Secondary | ICD-10-CM | POA: Diagnosis not present

## 2018-03-03 ENCOUNTER — Other Ambulatory Visit: Payer: Self-pay | Admitting: Family Medicine

## 2018-03-03 DIAGNOSIS — J439 Emphysema, unspecified: Secondary | ICD-10-CM

## 2018-03-10 DIAGNOSIS — E892 Postprocedural hypoparathyroidism: Secondary | ICD-10-CM | POA: Diagnosis not present

## 2018-03-22 ENCOUNTER — Other Ambulatory Visit: Payer: Self-pay | Admitting: Family Medicine

## 2018-03-22 DIAGNOSIS — R251 Tremor, unspecified: Secondary | ICD-10-CM

## 2018-03-24 ENCOUNTER — Telehealth: Payer: Self-pay | Admitting: Family Medicine

## 2018-03-24 NOTE — Telephone Encounter (Signed)
Albuterol 1x/hr is too much. I think having her schedule an appt would be a good next step to sort some of these things out.

## 2018-03-24 NOTE — Telephone Encounter (Signed)
Legally, there is nothing we can do as she is a competent adult. From a social standpoint, I would have a family meeting with her, similar to an intervention, to see where everyone stands. TY.

## 2018-03-24 NOTE — Telephone Encounter (Signed)
I called the daughter back. She stated her mother if furious with her for contacting our office and sharing this information. The daughter stated her mom told their cousin she has only a few years to live and that she is going to drink and smoke, basically do what she wants. The daughter does not know what to do at this time.  Her mom's next appt is 04/14/18 and she does plan to come, but knows the patient does not want her in the room during exam.

## 2018-03-24 NOTE — Telephone Encounter (Signed)
Copied from [place] [id number]. Topic: Quick Communication - See Telephone Encounter >> Mar 24, 2018  9:44 AM Rutherford Nail, NT wrote: CRM for notification. See Telephone encounter for: 03/24/18. Patient's daughter, Lambert Keto, worried about her mother. States that the patient has started drinking again and the patient told the daughter that she occasionally has on and off issues breathing. Patient states to her daughter that she was told by Dr Nani Ravens to take albuterol 1x an hour. Lambert Keto is worried that she is not doing enough as the patient's POA. Would like to know what Dr Nani Ravens thinks.   CB#: [phone]

## 2018-03-24 NOTE — Telephone Encounter (Signed)
Patients daughter Lambert Keto informed of PCP instructions. She agreed to try to do.

## 2018-04-03 ENCOUNTER — Other Ambulatory Visit: Payer: Self-pay | Admitting: Family Medicine

## 2018-04-03 DIAGNOSIS — J439 Emphysema, unspecified: Secondary | ICD-10-CM

## 2018-04-11 ENCOUNTER — Ambulatory Visit: Payer: Medicare Other | Admitting: Family Medicine

## 2018-04-14 ENCOUNTER — Encounter: Payer: Self-pay | Admitting: Family Medicine

## 2018-04-14 ENCOUNTER — Ambulatory Visit (INDEPENDENT_AMBULATORY_CARE_PROVIDER_SITE_OTHER): Payer: Medicare Other | Admitting: Family Medicine

## 2018-04-14 VITALS — BP 130/72 | HR 80 | Temp 98.0°F | Ht 64.0 in | Wt 117.4 lb

## 2018-04-14 DIAGNOSIS — F411 Generalized anxiety disorder: Secondary | ICD-10-CM | POA: Diagnosis not present

## 2018-04-14 DIAGNOSIS — J439 Emphysema, unspecified: Secondary | ICD-10-CM | POA: Diagnosis not present

## 2018-04-14 DIAGNOSIS — Z720 Tobacco use: Secondary | ICD-10-CM | POA: Diagnosis not present

## 2018-04-14 DIAGNOSIS — Z87891 Personal history of nicotine dependence: Secondary | ICD-10-CM | POA: Insufficient documentation

## 2018-04-14 MED ORDER — BUSPIRONE HCL 15 MG PO TABS: 15.0000 mg | ORAL_TABLET | Freq: Three times a day (TID) | ORAL | 5 refills | Status: DC

## 2018-04-14 MED ORDER — NICOTINE 14 MG/24HR TD PT24: 14.0000 mg | MEDICATED_PATCH | Freq: Every day | TRANSDERMAL | 0 refills | Status: DC

## 2018-04-14 MED ORDER — NICOTINE 7 MG/24HR TD PT24: 7.0000 mg | MEDICATED_PATCH | Freq: Every day | TRANSDERMAL | 0 refills | Status: DC

## 2018-04-14 NOTE — Progress Notes (Signed)
Pre visit review using our clinic review tool, if applicable. No additional management support is needed unless otherwise documented below in the visit note. 

## 2018-04-14 NOTE — Patient Instructions (Addendum)
Stop smoking.  If you do not hear anything about your referral in the next 1-2 weeks, call our office and ask for an update.  Guaifenesin (generic Mucinex) may help your congestion.    Let us know if you need anything.

## 2018-04-14 NOTE — Progress Notes (Signed)
Chief Complaint  Patient presents with  . Follow-up    Andrea Ward is a 73 y.o. female here for COPD.  Currently treated with Incruse. Compliance is excellent. Uses rescue inhaler 2-4 times per week. Reports breathing is good overall. No nighttime awakenings. Biggest issue is the cost of the medication.  She still smokes 2 cigarettes daily.  She is requesting to go back on the patches as those did help.  Her anxiety is still bothersome.  She is currently on BuSpar 10 mg 3 times a day.  She would like to go up on the dose.  She feels it has been helpful overall.  ROS:  Lungs: No current SOB Psych: + Anxiety  Past Medical History:  Diagnosis Date  . Generalized anxiety disorder 05/23/2016  . GERD (gastroesophageal reflux disease) 05/23/2016  . Hypoparathyroidism ([place])    Acquired post surg  . Hypothyroid     BP 130/72 (BP Location: Left Arm, Patient Position: Sitting, Cuff Size: Normal)   Pulse 80   Temp 98 F (36.7 C) (Oral)   Ht 5\' 4"  (1.626 m)   Wt 117 lb 6 oz (53.2 kg)   SpO2 93%   BMI 20.15 kg/m  Gen: Awake, alert HEENT: MMM, nares patent, no polyps Heart: RRR, no LE edema Lungs: Distant breath sounds, otherwise CTAB, no accessory muscle use Msk: No clubbing or cyanosis Psych: Age appropriate judgment and insight  Pulmonary emphysema, unspecified emphysema type ([facility]) - Plan: Ambulatory referral to Pulmonology  Generalized anxiety disorder - Plan: busPIRone (BUSPAR) 15 MG tablet  Tobacco abuse - Plan: nicotine (NICODERM CQ - DOSED IN MG/24 HOURS) 14 mg/24hr patch, nicotine (NICODERM CQ - DOSED IN MG/24 HR) 7 mg/24hr patch  Refer to pulm as they may be better equipped to get her meds at a reasonable price.  I think she needs a LABA. Stop smoking.  6 weeks of the 14 mg/day patches followed by 2 weeks of the 7 mg/day patches. Will increase dose of BuSpar to 50 mg 3 times daily.  I do not believe increasing the dose beyond this will be helpful.  Will consider increasing  dose of propranolol if no improvement. F/u in 6 w for med ck. The patient voiced understanding and agreement to the plan.  [place]gie, DO 04/14/18 10:27 AM

## 2018-05-04 ENCOUNTER — Other Ambulatory Visit: Payer: Self-pay | Admitting: Family Medicine

## 2018-05-04 DIAGNOSIS — J439 Emphysema, unspecified: Secondary | ICD-10-CM

## 2018-05-19 ENCOUNTER — Ambulatory Visit (INDEPENDENT_AMBULATORY_CARE_PROVIDER_SITE_OTHER): Payer: Medicare Other | Admitting: Pulmonary Disease

## 2018-05-19 ENCOUNTER — Encounter: Payer: Self-pay | Admitting: Pulmonary Disease

## 2018-05-19 VITALS — BP 147/80 | HR 83 | Ht 64.0 in | Wt 120.0 lb

## 2018-05-19 DIAGNOSIS — R0602 Shortness of breath: Secondary | ICD-10-CM | POA: Diagnosis not present

## 2018-05-19 NOTE — Progress Notes (Signed)
Synopsis: Referred in March 2020 for Centrilobular emphysema  Subjective:   PATIENT ID: Andrea Ward GENDER: female DOB: [birth year], MRN: [id number]   HPI  Chief Complaint  Patient presents with  . Pulm Consult    Referred by Dr. Nani Ravens for emphysema and COPD. Has been prescribed Incruse and albuterol. States she feels the Incruse is working for her.    Patient is a 73 yo female with past medical history significant for hypothyroidism, general anxiety, GERD who presents today as a new consult from Dr. Nani Ravens for COPD/emphysema management.  Patient reports that she was diagnosed with COPD/emphysema in September 2019 while hospitalized.  Patient reports that she has been a lifelong smoker.  She used to smoke 1 pack a day has been cutting down since last summer.  She is currently smoking 2 cigarettes a day.  Patient was initially on [facility] but was switched to Incruse in October 2019.  Reports no change in her respiratory status with medication change.  She reports that her COPD emphysema has been fairly well controlled while on Incruse and albuterol as well.  She uses Incruse 1 puff daily and albuterol 2-3 times a week depending on her level of activity.  Patient reports that sometimes she has bad days and is unable to exert herself without being too short of breath.  Most days she has no problem with her breathing.  Denies any nighttime awakening, cough, wheezing.  Reports that she has had difficulty affording her current medication due to insurance.  Is currently being over $300 for her Incruse Ellipta but doubt she is going to be able to afford it going forward.  Past Medical History:  Diagnosis Date  . Generalized anxiety disorder 05/23/2016  . GERD (gastroesophageal reflux disease) 05/23/2016  . Hypoparathyroidism ([place])    Acquired post surg  . Hypothyroid      Family History  Problem Relation Age of Onset  . Hypertension Mother   . Hypertension Father   . Anuerysm Sister      Social History   Socioeconomic History  . Marital status: Single    Spouse name: Not on file  . Number of children: Not on file  . Years of education: Not on file  . Highest education level: Not on file  Occupational History  . Not on file  Social Needs  . Financial resource strain: Not on file  . Food insecurity:    Worry: Not on file    Inability: Not on file  . Transportation needs:    Medical: Not on file    Non-medical: Not on file  Tobacco Use  . Smoking status: Current Every Day Smoker    Packs/day: 0.75    Years: 50.00    Pack years: 37.50    Types: Cigarettes  . Smokeless tobacco: Never Used  Substance and Sexual Activity  . Alcohol use: Yes    Comment: box of wine daily  . Drug use: Not Currently  . Sexual activity: Not Currently  Lifestyle  . Physical activity:    Days per week: Not on file    Minutes per session: Not on file  . Stress: Not on file  Relationships  . Social connections:    Talks on phone: Not on file    Gets together: Not on file    Attends religious service: Not on file    Active member of club or organization: Not on file    Attends meetings of clubs or organizations: Not on file  Relationship status: Not on file  . Intimate partner violence:    Fear of current or ex partner: Not on file    Emotionally abused: Not on file    Physically abused: Not on file    Forced sexual activity: Not on file  Other Topics Concern  . Not on file  Social History Narrative  . Not on file     No Known Allergies   Outpatient Medications Prior to Visit  Medication Sig Dispense Refill  . busPIRone (BUSPAR) 15 MG tablet Take 1 tablet (15 mg total) by mouth 3 (three) times daily. 90 tablet 5  . calcitRIOL (ROCALTROL) 0.25 MCG capsule Take 1 capsule (0.25 mcg total) by mouth daily. Take 1 capsule by mouth twice daily for 14 days (ends on 12/14/17) then 1 capsule daily. 90 capsule 0  . Cyanocobalamin (VITAMIN B-12 PO) Take 1 tablet by mouth daily.     Marland Kitchen ibuprofen (ADVIL,MOTRIN) 200 MG tablet Take 1 tablet by mouth daily.    . INCRUSE ELLIPTA 62.5 MCG/INH AEPB INHALE 1 PUFF BY MOUTH ONCE DAILY 30 each 0  . levothyroxine (SYNTHROID, LEVOTHROID) 50 MCG tablet Take 50 mcg by mouth daily before breakfast.    . magnesium oxide (MAG-OX) 400 (241.3 Mg) MG tablet TAKE 1 TABLET BY MOUTH TWICE DAILY FOR 30 DAYS  0  . nicotine (NICODERM CQ - DOSED IN MG/24 HOURS) 14 mg/24hr patch Place 1 patch (14 mg total) onto the skin daily. 42 patch 0  . [START ON 05/26/2018] nicotine (NICODERM CQ - DOSED IN MG/24 HR) 7 mg/24hr patch Place 1 patch (7 mg total) onto the skin daily. 14 patch 0  . propranolol (INDERAL) 20 MG tablet TAKE 1 TABLET BY MOUTH THREE TIMES DAILY 270 tablet 0  . calcium carbonate (OS-CAL - DOSED IN MG OF ELEMENTAL CALCIUM) 1250 (500 Ca) MG tablet Take 3 tablets by mouth every evening.      No facility-administered medications prior to visit.     Review of Systems  Constitutional: Negative.   HENT: Negative.   Eyes: Negative.   Respiratory: Positive for shortness of breath. Negative for cough and wheezing.   Cardiovascular: Negative.   Gastrointestinal: Negative.   Genitourinary: Negative.   Musculoskeletal: Positive for myalgias.  Skin: Negative.   Neurological: Negative.     Objective:  Physical Exam Constitutional:      Appearance: Normal appearance.  HENT:     Head: Normocephalic and atraumatic.     Nose: Nose normal.     Mouth/Throat:     Mouth: Mucous membranes are moist.     Pharynx: Oropharynx is clear.  Eyes:     Conjunctiva/sclera: Conjunctivae normal.     Pupils: Pupils are equal, round, and reactive to light.  Neck:     Musculoskeletal: Normal range of motion and neck supple.  Cardiovascular:     Rate and Rhythm: Normal rate and regular rhythm.     Pulses: Normal pulses.     Heart sounds: Normal heart sounds.  Pulmonary:     Effort: Pulmonary effort is normal.     Comments: Will moving air clearly upper lung  fields bilaterally.  Decreased breath sounds at the bases bilaterally.  No wheezing, rhonchi, crackles noted.  No increased work of breathing. Abdominal:     General: Abdomen is flat. Bowel sounds are normal.     Palpations: Abdomen is soft.  Musculoskeletal: Normal range of motion.  Skin:    General: Skin is warm and dry.  Capillary Refill: Capillary refill takes less than 2 seconds.  Neurological:     General: No focal deficit present.     Mental Status: She is alert.  Psychiatric:        Mood and Affect: Mood normal.      Vitals:   05/19/18 1431  BP: (!) 147/80  Pulse: 83  SpO2: 97%  Weight: 120 lb (54.4 kg)  Height: 5\' 4"  (1.626 m)    CBC    Component Value Date/Time   WBC 3.7 (L) 07/10/2017 1609   RBC 3.37 (L) 07/10/2017 1609   HGB 13.9 07/10/2017 1609   HCT 40.7 07/10/2017 1609   PLT 297.0 07/10/2017 1609   MCV 120.9 Repeated and verified X2. (H) 07/10/2017 1609   MCH 40.9 (H) 05/15/2016 0830   MCHC 34.1 07/10/2017 1609   RDW 13.9 07/10/2017 1609   LYMPHSABS 0.9 07/10/2017 1609   MONOABS 0.4 07/10/2017 1609   EOSABS 0.1 07/10/2017 1609   BASOSABS 0.0 07/10/2017 1609     Chest imaging: 06/2017 lung cancer screening CT : significant centrilobular emphysema; RADS 2S, CAD,  PFT: 05/2018 Spirometry: Ratio 49% FEV1 0.8L 42%  Labs:  Path:  Echo:  Heart Catheterization:       Assessment & Plan:   Shortness of breath - Plan: Spirometry with Graph  Discussion: Patient is a 73 year old female with a recent diagnosis of COPD/emphysema with long history of smoking.  Currently still smoking about 2 cigarettes a day.  Presented today with mild symptoms no recent exacerbation.  She was on Incruse Ellipta with albuterol as needed reported fairly good control.  Exam was unremarkable except for distant breath sounds at the bases and unusual for emphysema patients.  Was ambulated with pulse ox and did not desaturate under 94%.  Spirometry was performed showed  FEV1/FVC <49% consistent with COPD FEV1 of 42% which puts her in the severe category.  She has had difficulty affording her medications and given symptoms and long history of smoking would benefit from tighter control.  Patient was switched from Incruse to Perforomist and Yupelri nebulized, this will allow medication cost to be significantly less for patient. --Stop Incruse Ellipta --Start Perforomist and Yupelri nebulized daily --Continue albuterol as needed for rescue --Follow-up in clinic in 4 to 6 weeks for reevaluation   Current Outpatient Medications:  .  busPIRone (BUSPAR) 15 MG tablet, Take 1 tablet (15 mg total) by mouth 3 (three) times daily., Disp: 90 tablet, Rfl: 5 .  calcitRIOL (ROCALTROL) 0.25 MCG capsule, Take 1 capsule (0.25 mcg total) by mouth daily. Take 1 capsule by mouth twice daily for 14 days (ends on 12/14/17) then 1 capsule daily., Disp: 90 capsule, Rfl: 0 .  Cyanocobalamin (VITAMIN B-12 PO), Take 1 tablet by mouth daily., Disp: , Rfl:  .  ibuprofen (ADVIL,MOTRIN) 200 MG tablet, Take 1 tablet by mouth daily., Disp: , Rfl:  .  INCRUSE ELLIPTA 62.5 MCG/INH AEPB, INHALE 1 PUFF BY MOUTH ONCE DAILY, Disp: 30 each, Rfl: 0 .  levothyroxine (SYNTHROID, LEVOTHROID) 50 MCG tablet, Take 50 mcg by mouth daily before breakfast., Disp: , Rfl:  .  magnesium oxide (MAG-OX) 400 (241.3 Mg) MG tablet, TAKE 1 TABLET BY MOUTH TWICE DAILY FOR 30 DAYS, Disp: , Rfl: 0 .  nicotine (NICODERM CQ - DOSED IN MG/24 HOURS) 14 mg/24hr patch, Place 1 patch (14 mg total) onto the skin daily., Disp: 42 patch, Rfl: 0 .  [START ON 05/26/2018] nicotine (NICODERM CQ - DOSED IN MG/24 HR) 7  mg/24hr patch, Place 1 patch (7 mg total) onto the skin daily., Disp: 14 patch, Rfl: 0 .  propranolol (INDERAL) 20 MG tablet, TAKE 1 TABLET BY MOUTH THREE TIMES DAILY, Disp: 270 tablet, Rfl: 0 .  calcium carbonate (OS-CAL - DOSED IN MG OF ELEMENTAL CALCIUM) 1250 (500 Ca) MG tablet, Take 3 tablets by mouth every evening. , Disp: ,  Rfl:    Marjie Skiff, MD [place], PGY-3  Attending:  I have independently seen and examined Ms. Powell today in clinic. As described above she has a history of shortness of breath with exertion, sometimes cough.  Her symptoms are somewhat controlled with Incruse but she still has symptoms despite taking this medicine.  Further, she is frustrated with the cost of the medicine.  She is here to establish care with [place] today.  She tells me that she still smoking 2 packs of cigarettes daily.  She is quite proud of the fact that she has steadily decreased her cigarette intake over the last few months and has stuck to her plan of quitting altogether through a slow taper.  On physical exam: Poor air movement as described above, no wheezing noted, somewhat of a barrel chest, cardiovascular regular rate and rhythm no murmurs gallops rubs, belly soft, nontender nondistended  Lung CT scan from April 2019 independently reviewed showing moderate to severe centrilobular emphysema right greater than left in the upper lobes more so than in the lower lobes.  RADS 2  Impression Severe COPD Cigarette smoker Centrilobular emphysema Shortness of breath  Plan: Stop Anoro Start Perforomist Start [place] We will make an attempt to prescribe these through a DME pharmacy using Medicare part B Continue albuterol as needed for chest tightness wheezing or shortness of breath She was encouraged to quit smoking today, she has a plan and seems to be sticking with it so she was encouraged to continue to follow this out  Follow-up 4 to 6 weeks or sooner if needed  Roselie Awkward, MD [place] [facility] Pager: [phone] Cell: [phone] If no response, call [phone]

## 2018-05-19 NOTE — Patient Instructions (Signed)
It was nice to see you today in clinic.  As discussed today we will make a few changes to your current medication regimen. 1.  We want you to stop taking Incruse Ellipta. 2.  We will start you on two a new medications which will help you better control your symptoms and improve your breathing.  The first medication is called Perforomist and the second medication is called Yulperi.  Both of these medication will be delivered through nebulizer machine. The nebulizer machine should be delivered to your house and the medications will be mailed. 3. The changes we made today to your medications will decrease cost significantly as long as you are using them as nebulized.   Follow-up with your primary care provider as scheduled.  We will see you here in our clinic in 4 to 6 weeks to check on improvement in symptoms and breathing.  Have any questions please not hesitate to contact us.  The number provided in your discharge summary

## 2018-05-26 ENCOUNTER — Ambulatory Visit (INDEPENDENT_AMBULATORY_CARE_PROVIDER_SITE_OTHER): Payer: Medicare Other | Admitting: Family Medicine

## 2018-05-26 ENCOUNTER — Encounter: Payer: Self-pay | Admitting: Family Medicine

## 2018-05-26 VITALS — BP 138/79 | HR 100 | Temp 97.8°F | Ht 64.0 in | Wt 120.0 lb

## 2018-05-26 DIAGNOSIS — R0989 Other specified symptoms and signs involving the circulatory and respiratory systems: Secondary | ICD-10-CM

## 2018-05-26 DIAGNOSIS — F411 Generalized anxiety disorder: Secondary | ICD-10-CM | POA: Diagnosis not present

## 2018-05-26 DIAGNOSIS — Z720 Tobacco use: Secondary | ICD-10-CM | POA: Diagnosis not present

## 2018-05-26 DIAGNOSIS — J439 Emphysema, unspecified: Secondary | ICD-10-CM | POA: Diagnosis not present

## 2018-05-26 MED ORDER — ALBUTEROL SULFATE HFA 108 (90 BASE) MCG/ACT IN AERS: 1.0000 | INHALATION_SPRAY | Freq: Four times a day (QID) | RESPIRATORY_TRACT | 2 refills | Status: DC | PRN

## 2018-05-26 NOTE — Progress Notes (Signed)
Chief Complaint  Patient presents with  . Follow-up    Subjective Andrea Ward presents for f/u anxiety/depression.  She is currently being treated with BuSpar 15 mg tid.  Reports good improvement since treatment. No thoughts of harming self or others. No self-medication with alcohol, prescription drugs or illicit drugs. Pt is not following with a counselor/psychologist.  Doing well with emphysema. Needs refill of albuterol. Smoking 2 cigarettes daily, down from 10. Feels better, did not use patches 2/2 cost and she feels she does not need them.  No hx of bruits or carotid US  ROS Psych: No homicidal or suicidal thoughts Lungs: +intermittent sob  Past Medical History:  Diagnosis Date  . Generalized anxiety disorder 05/23/2016  . GERD (gastroesophageal reflux disease) 05/23/2016  . Hypoparathyroidism ([place])    Acquired post surg  . Hypothyroid    Exam BP 138/79 (BP Location: Left Arm, Patient Position: Sitting, Cuff Size: Normal)   Pulse 100   Temp 97.8 F (36.6 C) (Oral)   Ht 5\' 4"  (1.626 m)   Wt 120 lb (54.4 kg)   SpO2 93%   BMI 20.60 kg/m  General:  well developed, well nourished, in no apparent distress Lungs:  clear to auscultation, breath sounds equal bilaterally, no respiratory distress Cardio:  regular rate and rhythm, no LE edema, +R sided bruit Psych: well oriented with normal range of affect and age-appropriate judgement/insight, alert and oriented x4.  Assessment and Plan  Generalized anxiety disorder  Pulmonary emphysema, unspecified emphysema type ([place]) - Plan: albuterol (PROVENTIL HFA;VENTOLIN HFA) 108 (90 Base) MCG/ACT inhaler  Tobacco abuse  Bruit of right carotid artery - Plan: US Carotid Duplex Bilateral  Cont BuSpar. Refill SABA. Counseled on tobacco cessation. Ck Duplex.  F/u in 6 mo or prn. The patient voiced understanding and agreement to the plan.  [place], DO 05/26/18 9:50 AM

## 2018-05-26 NOTE — Patient Instructions (Addendum)
Have a great birthday! Drink responsibly.   Aim to do some physical exertion for 150 minutes per week. This is typically divided into 5 days per week, 30 minutes per day. The activity should be enough to get your heart rate up. Anything is better than nothing if you have time constraints.  Someone should reach out to you in the next week or so regarding your ultrasound of the neck.   Let us know if you need anything.

## 2018-05-28 ENCOUNTER — Other Ambulatory Visit: Payer: Self-pay

## 2018-05-28 DIAGNOSIS — R0602 Shortness of breath: Secondary | ICD-10-CM

## 2018-05-28 MED ORDER — REVEFENACIN 175 MCG/3ML IN SOLN: 3.0000 mL | Freq: Every day | RESPIRATORY_TRACT | 5 refills | Status: DC

## 2018-05-28 MED ORDER — FORMOTEROL FUMARATE 20 MCG/2ML IN NEBU: 20.0000 ug | INHALATION_SOLUTION | Freq: Two times a day (BID) | RESPIRATORY_TRACT | 6 refills | Status: DC

## 2018-05-28 NOTE — Telephone Encounter (Signed)
Patient emailed regarding the nebulization medication Andrea Ward will cost over $300.00 for one month supply Provided her with a coupon and good RX information for yupelri to see if that would help her situation She will email/call back later in the week with an update.

## 2018-05-29 ENCOUNTER — Encounter: Payer: Self-pay | Admitting: Family Medicine

## 2018-05-29 DIAGNOSIS — J439 Emphysema, unspecified: Secondary | ICD-10-CM

## 2018-05-30 MED ORDER — ALBUTEROL SULFATE HFA 108 (90 BASE) MCG/ACT IN AERS: 1.0000 | INHALATION_SPRAY | Freq: Four times a day (QID) | RESPIRATORY_TRACT | 2 refills | Status: DC | PRN

## 2018-06-02 ENCOUNTER — Other Ambulatory Visit: Payer: Self-pay | Admitting: Family Medicine

## 2018-06-02 ENCOUNTER — Other Ambulatory Visit: Payer: Self-pay

## 2018-06-02 ENCOUNTER — Ambulatory Visit (HOSPITAL_BASED_OUTPATIENT_CLINIC_OR_DEPARTMENT_OTHER)
Admission: RE | Admit: 2018-06-02 | Discharge: 2018-06-02 | Disposition: A | Discharge status: Home / Self Care | Payer: Medicare Other | Source: Ambulatory Visit | Attending: Family Medicine | Admitting: Family Medicine

## 2018-06-02 ENCOUNTER — Encounter (HOSPITAL_BASED_OUTPATIENT_CLINIC_OR_DEPARTMENT_OTHER): Payer: Self-pay | Admitting: Radiology

## 2018-06-02 DIAGNOSIS — R0989 Other specified symptoms and signs involving the circulatory and respiratory systems: Secondary | ICD-10-CM | POA: Diagnosis not present

## 2018-06-02 DIAGNOSIS — I6529 Occlusion and stenosis of unspecified carotid artery: Secondary | ICD-10-CM

## 2018-06-02 DIAGNOSIS — I6523 Occlusion and stenosis of bilateral carotid arteries: Secondary | ICD-10-CM | POA: Diagnosis not present

## 2018-06-03 ENCOUNTER — Other Ambulatory Visit: Payer: Self-pay | Admitting: Family Medicine

## 2018-06-03 DIAGNOSIS — J439 Emphysema, unspecified: Secondary | ICD-10-CM

## 2018-06-03 MED ORDER — UMECLIDINIUM BROMIDE 62.5 MCG/INH IN AEPB: 1.0000 | INHALATION_SPRAY | Freq: Every day | RESPIRATORY_TRACT | 2 refills | Status: DC

## 2018-06-03 NOTE — Addendum Note (Signed)
Addended by: Ames Coupe on: 06/03/2018 04:26 PM   Modules accepted: Orders

## 2018-06-03 NOTE — Telephone Encounter (Signed)
Incruse sent in to hold over until pulm team finds which inhalers they can get her at a reasonable price. TY.

## 2018-07-02 ENCOUNTER — Other Ambulatory Visit: Payer: Self-pay | Admitting: Family Medicine

## 2018-07-02 DIAGNOSIS — R251 Tremor, unspecified: Secondary | ICD-10-CM

## 2018-07-02 DIAGNOSIS — J439 Emphysema, unspecified: Secondary | ICD-10-CM

## 2018-07-02 MED ORDER — PROPRANOLOL HCL 20 MG PO TABS: 20.0000 mg | ORAL_TABLET | Freq: Three times a day (TID) | ORAL | 0 refills | Status: DC

## 2018-07-02 MED ORDER — ALBUTEROL SULFATE HFA 108 (90 BASE) MCG/ACT IN AERS: 1.0000 | INHALATION_SPRAY | Freq: Four times a day (QID) | RESPIRATORY_TRACT | 2 refills | Status: DC | PRN

## 2018-07-07 ENCOUNTER — Ambulatory Visit: Payer: Medicare Other | Admitting: Pulmonary Disease

## 2018-07-11 NOTE — Progress Notes (Signed)
Virtual Visit via Video Note  I connected with patient on 07/14/18 at 11:00 AM EDT by a video enabled telemedicine application and verified that I am speaking with the correct person using two identifiers.   THIS ENCOUNTER IS A VIRTUAL VISIT DUE TO COVID-19 - PATIENT WAS NOT SEEN IN THE OFFICE. PATIENT HAS CONSENTED TO VIRTUAL VISIT / TELEMEDICINE VISIT   Location of patient: home  Location of provider: office  I discussed the limitations of evaluation and management by telemedicine and the availability of in person appointments. The patient expressed understanding and agreed to proceed.   Subjective:   Andrea Ward is a 73 y.o. female who presents for Medicare Annual (Subsequent) preventive examination.  Review of Systems: No ROS.  Medicare Wellness Visit. Additional risk factors are reflected in the social history. Cardiac Risk Factors include: advanced age (>25men, >85 women);dyslipidemia;smoking/ tobacco exposure Sleep patterns:  No issues Home Safety/Smoke Alarms: Feels safe in home. Smoke alarms in place.  Lives alone in 1 story home. Shower chair in step over tub.   Female:         Mammo-07/15/17 . Will wait until after pandemic is over for any scans.   Dexa scan-  05/31/16      CCS-pt last reported  2011    Objective:     Vitals: Unable to assess. This visit is enabled though telemedicine due to Covid 19. Pt reprots O2 = 96%/ RA  Advanced Directives 07/14/2018 07/09/2017 05/15/2016  Does Patient Have a Medical Advance Directive? Yes Yes No  Type of Paramedic of [place];Living will [place];Living will -  Does patient want to make changes to medical advance directive? No - Patient declined No - Patient declined -  Copy of [place] in Chart? No - copy requested No - copy requested -  Would patient like information on creating a medical advance directive? - - No - Patient declined    Tobacco Social  History   Tobacco Use  Smoking Status Current Every Day Smoker  . Packs/day: 0.75  . Types: Cigarettes  . Start date: 03/19/1965  Smokeless Tobacco Never Used  Tobacco Comment   Hx 10 cigs/day, down to 2 cigs/day March 2020     Ready to quit: Not Answered Counseling given: Not Answered Comment: Hx 10 cigs/day, down to 2 cigs/day March 2020   Clinical Intake: Pain : No/denies pain    Past Medical History:  Diagnosis Date  . Emphysema of lung ([place])   . Generalized anxiety disorder 05/23/2016  . GERD (gastroesophageal reflux disease) 05/23/2016  . History of pregnancy induced hypertension   . Hypoparathyroidism ([place])    Acquired post surg  . Hypothyroid    Past Surgical History:  Procedure Laterality Date  . THYROIDECTOMY  05/17/2016  . TONSILLECTOMY     Family History  Problem Relation Age of Onset  . Hypertension Mother   . Hypertension Father   . Anuerysm Sister    Social History   Socioeconomic History  . Marital status: Single    Spouse name: Not on file  . Number of children: Not on file  . Years of education: Not on file  . Highest education level: Not on file  Occupational History  . Not on file  Social Needs  . Financial resource strain: Not on file  . Food insecurity:    Worry: Not on file    Inability: Not on file  . Transportation needs:    Medical:  Not on file    Non-medical: Not on file  Tobacco Use  . Smoking status: Current Every Day Smoker    Packs/day: 0.75    Types: Cigarettes    Start date: 03/19/1965  . Smokeless tobacco: Never Used  . Tobacco comment: Hx 10 cigs/day, down to 2 cigs/day March 2020  Substance and Sexual Activity  . Alcohol use: Yes    Alcohol/week: 5.0 standard drinks    Types: 5 Glasses of wine per week  . Drug use: Not Currently  . Sexual activity: Not Currently  Lifestyle  . Physical activity:    Days per week: Not on file    Minutes per session: Not on file  . Stress: Not on file  Relationships  . Social  connections:    Talks on phone: Not on file    Gets together: Not on file    Attends religious service: Not on file    Active member of club or organization: Not on file    Attends meetings of clubs or organizations: Not on file    Relationship status: Not on file  Other Topics Concern  . Not on file  Social History Narrative  . Not on file    Outpatient Encounter Medications as of 07/14/2018  Medication Sig  . albuterol (PROVENTIL HFA;VENTOLIN HFA) 108 (90 Base) MCG/ACT inhaler Inhale 1-2 puffs into the lungs every 6 (six) hours as needed for up to 30 days for wheezing or shortness of breath.  . busPIRone (BUSPAR) 15 MG tablet Take 1 tablet (15 mg total) by mouth 3 (three) times daily.  . calcitRIOL (ROCALTROL) 0.25 MCG capsule Take 1 capsule (0.25 mcg total) by mouth daily. Take 1 capsule by mouth twice daily for 14 days (ends on 12/14/17) then 1 capsule daily.  . Cyanocobalamin (VITAMIN B-12 PO) Take 1 tablet by mouth daily.  . formoterol (PERFOROMIST) 20 MCG/2ML nebulizer solution Take 2 mLs (20 mcg total) by nebulization 2 (two) times daily.  Marland Kitchen ibuprofen (ADVIL,MOTRIN) 200 MG tablet Take 1 tablet by mouth daily.  Marland Kitchen levothyroxine (SYNTHROID, LEVOTHROID) 50 MCG tablet Take 50 mcg by mouth daily before breakfast.  . magnesium oxide (MAG-OX) 400 (241.3 Mg) MG tablet TAKE 1 TABLET BY MOUTH TWICE DAILY FOR 30 DAYS  . propranolol (INDERAL) 20 MG tablet Take 1 tablet (20 mg total) by mouth 3 (three) times daily.  . Revefenacin (YUPELRI) 175 MCG/3ML SOLN Inhale 3 mLs into the lungs daily.  Marland Kitchen umeclidinium bromide (INCRUSE ELLIPTA) 62.5 MCG/INH AEPB Inhale 1 puff into the lungs daily.  . [DISCONTINUED] INCRUSE ELLIPTA 62.5 MCG/INH AEPB Inhale 1 puff by mouth once daily  . calcium carbonate (OS-CAL - DOSED IN MG OF ELEMENTAL CALCIUM) 1250 (500 Ca) MG tablet Take 3 tablets by mouth every evening.    No facility-administered encounter medications on file as of 07/14/2018.     Activities of Daily  Living In your present state of health, do you have any difficulty performing the following activities: 07/14/2018  Hearing? N  Vision? N  Comment wears glasses.  Difficulty concentrating or making decisions? N  Walking or climbing stairs? N  Dressing or bathing? N  Doing errands, shopping? N  Preparing Food and eating ? N  Using the Toilet? N  In the past six months, have you accidently leaked urine? N  Do you have problems with loss of bowel control? N  Managing your Medications? N  Managing your Finances? N  Housekeeping or managing your Housekeeping? N  Some recent  data might be hidden    Patient Care Team: Shelda Pal, DO as PCP - General (Family Medicine)    Assessment:   This is a routine wellness examination for [place]. Physical assessment deferred to PCP.  Exercise Activities and Dietary recommendations Current Exercise Habits: The patient does not participate in regular exercise at present, Exercise limited by: respiratory conditions(s) Diet (meal preparation, eat out, water intake, caffeinated beverages, dairy products, fruits and vegetables): well balanced, on average, 3 meals per day     Goals    . Quit Smoking       Fall Risk Fall Risk  07/14/2018 07/09/2017 05/23/2016  Falls in the past year? 0 No No   Depression Screen PHQ 2/9 Scores 07/14/2018 07/09/2017 05/23/2016  PHQ - 2 Score 0 0 0     Cognitive Function Ad8 score reviewed for issues:  Issues making decisions:no  Less interest in hobbies / activities: no   Repeats questions, stories (family complaining): no   Trouble using ordinary gadgets (microwave, computer, phone):no  Forgets the month or year: no  Mismanaging finances: no  Remembering appts:no  Daily problems with thinking and/or memory:no Ad8 score is=0     MMSE - Mini Mental State Exam 07/09/2017  Orientation to time 5  Orientation to Place 5  Registration 3  Attention/ Calculation 5  Recall 2  Language- name 2  objects 2  Language- repeat 1  Language- follow 3 step command 3  Language- read & follow direction 1  Write a sentence 1  Copy design 1  Total score 29        Immunization History  Administered Date(s) Administered  . Pneumococcal Conjugate-13 05/23/2016  . Pneumococcal Polysaccharide-23 07/10/2017  . Tdap 05/23/2016    Screening Tests Health Maintenance  Topic Date Due  . INFLUENZA VACCINE  06/17/2026 (Originally 10/18/2018)  . COLONOSCOPY  03/20/2019  . MAMMOGRAM  07/16/2019  . TETANUS/TDAP  05/24/2026  . DEXA SCAN  Completed  . Hepatitis C Screening  Completed  . PNA vac Low Risk Adult  Completed      Plan:  See you next year.  Continue to eat heart healthy diet (full of fruits, vegetables, whole grains, lean protein, water--limit salt, fat, and sugar intake) and increase physical activity as tolerated.  Continue doing brain stimulating activities (puzzles, reading, adult coloring books, staying active) to keep memory sharp.   Bring a copy of your living will and/or healthcare power of attorney to your next office visit.      I have personally reviewed and noted the following in the patient's chart:   . Medical and social history . Use of alcohol, tobacco or illicit drugs  . Current medications and supplements . Functional ability and status . Nutritional status . Physical activity . Advanced directives . List of other physicians . Hospitalizations, surgeries, and ER visits in previous 12 months . Vitals . Screenings to include cognitive, depression, and falls . Referrals and appointments  In addition, I have reviewed and discussed with patient certain preventive protocols, quality metrics, and best practice recommendations. A written personalized care plan for preventive services as well as general preventive health recommendations were provided to patient.     Naaman Plummer [place], [place]  07/14/2018

## 2018-07-14 ENCOUNTER — Other Ambulatory Visit: Payer: Self-pay

## 2018-07-14 ENCOUNTER — Encounter: Payer: Self-pay | Admitting: *Deleted

## 2018-07-14 ENCOUNTER — Ambulatory Visit (INDEPENDENT_AMBULATORY_CARE_PROVIDER_SITE_OTHER): Payer: Medicare Other | Admitting: *Deleted

## 2018-07-14 DIAGNOSIS — Z0000 Encounter for general adult medical examination without abnormal findings: Secondary | ICD-10-CM

## 2018-07-14 NOTE — Patient Instructions (Signed)
See you next year.  Continue to eat heart healthy diet (full of fruits, vegetables, whole grains, lean protein, water--limit salt, fat, and sugar intake) and increase physical activity as tolerated.  Continue doing brain stimulating activities (puzzles, reading, adult coloring books, staying active) to keep memory sharp.   Bring a copy of your living will and/or healthcare power of attorney to your next office visit.   Ms. Andrea Ward , Thank you for taking time to come for your Medicare Wellness Visit. I appreciate your ongoing commitment to your health goals. Please review the following plan we discussed and let me know if I can assist you in the future.   These are the goals we discussed: Goals    . Quit Smoking       This is a list of the screening recommended for you and due dates:  Health Maintenance  Topic Date Due  . Flu Shot  06/17/2026*  . Colon Cancer Screening  03/20/2019  . Mammogram  07/16/2019  . Tetanus Vaccine  05/24/2026  . DEXA scan (bone density measurement)  Completed  .  Hepatitis C: One time screening is recommended by Center for Disease Control  (CDC) for  adults born from [birth year] through 1963.   Completed  . Pneumonia vaccines  Completed  *Topic was postponed. The date shown is not the original due date.    Health Maintenance After Age 65 After age 42, you are at a higher risk for certain long-term diseases and infections as well as injuries from falls. Falls are a major cause of broken bones and head injuries in people who are older than age 88. Getting regular preventive care can help to keep you healthy and well. Preventive care includes getting regular testing and making lifestyle changes as recommended by your health care provider. Talk with your health care provider about:  Which screenings and tests you should have. A screening is a test that checks for a disease when you have no symptoms.  A diet and exercise plan that is right for you. What should I know  about screenings and tests to prevent falls? Screening and testing are the best ways to find a health problem early. Early diagnosis and treatment give you the best chance of managing medical conditions that are common after age 21. Certain conditions and lifestyle choices may make you more likely to have a fall. Your health care provider may recommend:  Regular vision checks. Poor vision and conditions such as cataracts can make you more likely to have a fall. If you wear glasses, make sure to get your prescription updated if your vision changes.  Medicine review. Work with your health care provider to regularly review all of the medicines you are taking, including over-the-counter medicines. Ask your health care provider about any side effects that may make you more likely to have a fall. Tell your health care provider if any medicines that you take make you feel dizzy or sleepy.  Osteoporosis screening. Osteoporosis is a condition that causes the bones to get weaker. This can make the bones weak and cause them to break more easily.  Blood pressure screening. Blood pressure changes and medicines to control blood pressure can make you feel dizzy.  Strength and balance checks. Your health care provider may recommend certain tests to check your strength and balance while standing, walking, or changing positions.  Foot health exam. Foot pain and numbness, as well as not wearing proper footwear, can make you more likely to have  a fall.  Depression screening. You may be more likely to have a fall if you have a fear of falling, feel emotionally low, or feel unable to do activities that you used to do.  Alcohol use screening. Using too much alcohol can affect your balance and may make you more likely to have a fall. What actions can I take to lower my risk of falls? General instructions  Talk with your health care provider about your risks for falling. Tell your health care provider if: ? You fall.  Be sure to tell your health care provider about all falls, even ones that seem minor. ? You feel dizzy, sleepy, or off-balance.  Take over-the-counter and prescription medicines only as told by your health care provider. These include any supplements.  Eat a healthy diet and maintain a healthy weight. A healthy diet includes low-fat dairy products, low-fat (lean) meats, and fiber from whole grains, beans, and lots of fruits and vegetables. Home safety  Remove any tripping hazards, such as rugs, cords, and clutter.  Install safety equipment such as grab bars in bathrooms and safety rails on stairs.  Keep rooms and walkways well-lit. Activity   Follow a regular exercise program to stay fit. This will help you maintain your balance. Ask your health care provider what types of exercise are appropriate for you.  If you need a cane or walker, use it as recommended by your health care provider.  Wear supportive shoes that have nonskid soles. Lifestyle  Do not drink alcohol if your health care provider tells you not to drink.  If you drink alcohol, limit how much you have: ? 0-1 drink a day for women. ? 0-2 drinks a day for men.  Be aware of how much alcohol is in your drink. In the [place] one drink equals one typical bottle of beer (12 oz), one-half glass of wine (5 oz), or one shot of hard liquor (1 oz).  Do not use any products that contain nicotine or tobacco, such as cigarettes and e-cigarettes. If you need help quitting, ask your health care provider. Summary  Having a healthy lifestyle and getting preventive care can help to protect your health and wellness after age 4.  Screening and testing are the best way to find a health problem early and help you avoid having a fall. Early diagnosis and treatment give you the best chance for managing medical conditions that are more common for people who are older than age 29.  Falls are a major cause of broken bones and head injuries in  people who are older than age 33. Take precautions to prevent a fall at home.  Work with your health care provider to learn what changes you can make to improve your health and wellness and to prevent falls. This information is not intended to replace advice given to you by your health care provider. Make sure you discuss any questions you have with your health care provider. Document Released: 01/16/2017 Document Revised: 01/16/2017 Document Reviewed: 01/16/2017 Elsevier Interactive Patient Education  2019 [employer].

## 2018-07-14 NOTE — Progress Notes (Signed)
Noted. Agree with above.  [place], DO 07/14/18 1:07 PM

## 2018-07-15 ENCOUNTER — Other Ambulatory Visit: Payer: Self-pay

## 2018-07-15 DIAGNOSIS — I6529 Occlusion and stenosis of unspecified carotid artery: Secondary | ICD-10-CM

## 2018-07-31 ENCOUNTER — Telehealth (HOSPITAL_COMMUNITY): Payer: Self-pay

## 2018-07-31 NOTE — Telephone Encounter (Signed)
  Left voicemail for patient regarding Monday morning appointment.

## 2018-08-04 ENCOUNTER — Encounter (HOSPITAL_COMMUNITY): Payer: Medicare Other

## 2018-08-04 ENCOUNTER — Encounter: Payer: Medicare Other | Admitting: Surgery

## 2018-08-22 ENCOUNTER — Telehealth (HOSPITAL_COMMUNITY): Payer: Self-pay | Admitting: *Deleted

## 2018-08-22 NOTE — Telephone Encounter (Signed)
The above patient or their representative was contacted and gave the following answers to these questions:         Do you have any of the following symptoms? no  Fever                    Cough    copd               Shortness of breath copd  Do  you have any of the following other symptoms? no   muscle pain         vomiting,        diarrhea        rash         weakness        red eye        abdominal pain         bruising          bruising or bleeding              joint pain           severe headache    Have you been in contact with someone who was or has been sick in the past 2 weeks? no  Yes                 Unsure                         Unable to assess   Does the person that you were in contact with have any of the following symptoms?   Cough         shortness of breath           muscle pain         vomiting,            diarrhea            rash            weakness           fever            red eye           abdominal pain           bruising  or  bleeding                joint pain                severe headache               Have you  or someone you have been in contact with traveled internationally in th last month?         If yes, which countries? no   Have you  or someone you have been in contact with traveled outside [place] in th last month?         If yes, which state and city?  no   COMMENTS OR ACTION PLAN FOR THIS PATIENT:

## 2018-08-25 ENCOUNTER — Encounter: Payer: Self-pay | Admitting: Surgery

## 2018-08-25 ENCOUNTER — Other Ambulatory Visit: Payer: Self-pay

## 2018-08-25 ENCOUNTER — Ambulatory Visit (INDEPENDENT_AMBULATORY_CARE_PROVIDER_SITE_OTHER): Payer: Medicare Other | Admitting: Surgery

## 2018-08-25 ENCOUNTER — Ambulatory Visit (HOSPITAL_COMMUNITY)
Admission: RE | Admit: 2018-08-25 | Discharge: 2018-08-25 | Disposition: A | Discharge status: Home / Self Care | Payer: Medicare Other | Source: Ambulatory Visit | Attending: Surgery | Admitting: Surgery

## 2018-08-25 VITALS — BP 136/60 | HR 84 | Temp 98.1°F | Resp 14 | Ht 64.0 in | Wt 121.0 lb

## 2018-08-25 DIAGNOSIS — I6523 Occlusion and stenosis of bilateral carotid arteries: Secondary | ICD-10-CM | POA: Diagnosis not present

## 2018-08-25 DIAGNOSIS — I6529 Occlusion and stenosis of unspecified carotid artery: Secondary | ICD-10-CM | POA: Insufficient documentation

## 2018-08-25 MED ORDER — ROSUVASTATIN CALCIUM 10 MG PO TABS: 10.0000 mg | ORAL_TABLET | Freq: Every day | ORAL | 11 refills | Status: DC

## 2018-08-25 NOTE — Progress Notes (Addendum)
[facility]in Specialist of [place]  Patient name: Andrea Ward MRN: [id number] DOB: [birth year] Sex: female   REQUESTING PROVIDER:    Dr. Nani Ravens   REASON FOR CONSULT:    Carotid stenosis  HISTORY OF PRESENT ILLNESS:   Andrea Ward is a 73 y.o. female, who was found to have a right carotid bruit on exam and sent for duplex which revealed a significant stenosis.  She denies any symptoms such as numbness or weakness in either extremity, no slurred speech, no amaurosis fugax.  She is a smoker and suffers from emphysema.  However, she has recently almost quit she is not on a statin.  She does not take an aspirin.  PAST MEDICAL HISTORY    Past Medical History:  Diagnosis Date  . Emphysema of lung ([place])   . Generalized anxiety disorder 05/23/2016  . GERD (gastroesophageal reflux disease) 05/23/2016  . History of pregnancy induced hypertension   . Hypoparathyroidism ([place])    Acquired post surg  . Hypothyroid      FAMILY HISTORY   Family History  Problem Relation Age of Onset  . Hypertension Mother   . Hypertension Father   . Anuerysm Sister     SOCIAL HISTORY:   Social History   Socioeconomic History  . Marital status: Single    Spouse name: Not on file  . Number of children: Not on file  . Years of education: Not on file  . Highest education level: Not on file  Occupational History  . Not on file  Social Needs  . Financial resource strain: Not on file  . Food insecurity:    Worry: Not on file    Inability: Not on file  . Transportation needs:    Medical: Not on file    Non-medical: Not on file  Tobacco Use  . Smoking status: Current Every Day Smoker    Packs/day: 0.75    Types: Cigarettes    Start date: 03/19/1965  . Smokeless tobacco: Never Used  . Tobacco comment: Hx 10 cigs/day, down to 2 cigs/day March 2020  Substance and Sexual Activity  . Alcohol use: Yes    Alcohol/week: 5.0 standard drinks    Types: 5  Glasses of wine per week  . Drug use: Not Currently  . Sexual activity: Not Currently  Lifestyle  . Physical activity:    Days per week: Not on file    Minutes per session: Not on file  . Stress: Not on file  Relationships  . Social connections:    Talks on phone: Not on file    Gets together: Not on file    Attends religious service: Not on file    Active member of club or organization: Not on file    Attends meetings of clubs or organizations: Not on file    Relationship status: Not on file  . Intimate partner violence:    Fear of current or ex partner: Not on file    Emotionally abused: Not on file    Physically abused: Not on file    Forced sexual activity: Not on file  Other Topics Concern  . Not on file  Social History Narrative  . Not on file    ALLERGIES:    No Known Allergies  CURRENT MEDICATIONS:    Current Outpatient Medications  Medication Sig Dispense Refill  . albuterol (PROVENTIL HFA;VENTOLIN HFA) 108 (90 Base) MCG/ACT inhaler Inhale 1-2 puffs into the lungs every 6 (six) hours as needed for up to  30 days for wheezing or shortness of breath. 1 Inhaler 2  . busPIRone (BUSPAR) 15 MG tablet Take 1 tablet (15 mg total) by mouth 3 (three) times daily. 90 tablet 5  . calcitRIOL (ROCALTROL) 0.25 MCG capsule Take 1 capsule (0.25 mcg total) by mouth daily. Take 1 capsule by mouth twice daily for 14 days (ends on 12/14/17) then 1 capsule daily. 90 capsule 0  . calcium carbonate (OS-CAL - DOSED IN MG OF ELEMENTAL CALCIUM) 1250 (500 Ca) MG tablet Take 3 tablets by mouth every evening.     . Cyanocobalamin (VITAMIN B-12 PO) Take 1 tablet by mouth daily.    . formoterol (PERFOROMIST) 20 MCG/2ML nebulizer solution Take 2 mLs (20 mcg total) by nebulization 2 (two) times daily. 120 mL 6  . ibuprofen (ADVIL,MOTRIN) 200 MG tablet Take 1 tablet by mouth daily.    Marland Kitchen levothyroxine (SYNTHROID, LEVOTHROID) 50 MCG tablet Take 50 mcg by mouth daily before breakfast.    . magnesium  oxide (MAG-OX) 400 (241.3 Mg) MG tablet TAKE 1 TABLET BY MOUTH TWICE DAILY FOR 30 DAYS  0  . propranolol (INDERAL) 20 MG tablet Take 1 tablet (20 mg total) by mouth 3 (three) times daily. 270 tablet 0  . Revefenacin (YUPELRI) 175 MCG/3ML SOLN Inhale 3 mLs into the lungs daily. 90 mL 5  . umeclidinium bromide (INCRUSE ELLIPTA) 62.5 MCG/INH AEPB Inhale 1 puff into the lungs daily. 30 each 2   No current facility-administered medications for this visit.     REVIEW OF SYSTEMS:   [X]  denotes positive finding, [ ]  denotes negative finding Cardiac  Comments:  Chest pain or chest pressure:    Shortness of breath upon exertion:    Short of breath when lying flat:    Irregular heart rhythm:        Vascular    Pain in calf, thigh, or hip brought on by ambulation:    Pain in feet at night that wakes you up from your sleep:     Blood clot in your veins:    Leg swelling:         Pulmonary    Oxygen at home:    Productive cough:     Wheezing:         Neurologic    Sudden weakness in arms or legs:     Sudden numbness in arms or legs:     Sudden onset of difficulty speaking or slurred speech:    Temporary loss of vision in one eye:     Problems with dizziness:         Gastrointestinal    Blood in stool:      Vomited blood:         Genitourinary    Burning when urinating:     Blood in urine:        Psychiatric    Major depression:         Hematologic    Bleeding problems:    Problems with blood clotting too easily:        Skin    Rashes or ulcers:        Constitutional    Fever or chills:     PHYSICAL EXAM:   There were no vitals filed for this visit.  GENERAL: The patient is a well-nourished female, in no acute distress. The vital signs are documented above. CARDIAC: There is a regular rate and rhythm.  VASCULAR: Palpable pedal pulses PULMONARY: Nonlabored respirations ABDOMEN: Soft and non-tender.  No abdominal mass MUSCULOSKELETAL: There are no major deformities or  cyanosis. NEUROLOGIC: No focal weakness or paresthesias are detected. SKIN: There are no ulcers or rashes noted. PSYCHIATRIC: The patient has a normal affect.  STUDIES:   I have ordered and reviewed the following studies: Right Carotid: Velocities consistent with 1-39% stenosis in the proximal ICA and                40-59% stenosis in the distal ICA.  Left Carotid: Velocities consistent with 1-39% stenosis in the proximal ICA and               40-59% stenosis in the mid ICA.  Vertebrals: Bilateral vertebral arteries demonstrate antegrade flow. Elevated             velocities at the origin of the left vertebral.  Outside U/S shows right carotid stenosis of >70%, left carotid stenosis of 50-69%  ASSESSMENT and PLAN   Carotid stenosis, asymptomatic: I discussed with the patient that based on our imaging and with evaluation of the velocity profile from the [facility] imaging study, I am confident that her stenosis is less than 80%.  This puts her in the category of needing medical management.  I am starting her on Crestor 10 mg.  I have also encouraged her to begin taking aspirin 81 mg.  We also discussed the importance of smoking cessation.  She will follow-up in 6 months with a repeat carotid ultrasound.  She was educated on the signs and symptoms of stroke and to call 911 immediately should they occur.   Leia Alf, MD, FACS [facility] [phone] Pager [phone]

## 2018-09-09 DIAGNOSIS — E890 Postprocedural hypothyroidism: Secondary | ICD-10-CM | POA: Diagnosis not present

## 2018-09-09 DIAGNOSIS — E892 Postprocedural hypoparathyroidism: Secondary | ICD-10-CM | POA: Diagnosis not present

## 2018-09-30 ENCOUNTER — Other Ambulatory Visit: Payer: Self-pay | Admitting: Family Medicine

## 2018-09-30 DIAGNOSIS — J439 Emphysema, unspecified: Secondary | ICD-10-CM

## 2018-10-08 ENCOUNTER — Telehealth: Payer: Self-pay

## 2018-10-08 ENCOUNTER — Other Ambulatory Visit: Payer: Self-pay | Admitting: Family Medicine

## 2018-10-08 DIAGNOSIS — R251 Tremor, unspecified: Secondary | ICD-10-CM

## 2018-10-08 MED ORDER — PROPRANOLOL HCL 20 MG PO TABS: 20.0000 mg | ORAL_TABLET | Freq: Three times a day (TID) | ORAL | 0 refills | Status: DC

## 2018-10-08 NOTE — Telephone Encounter (Signed)
Copied from [place] [id number]. Topic: Quick Sport and exercise psychologist Patient (Clinic Use ONLY) >> Oct 07, 2018  4:59 PM Pauline Good wrote: Reason for CRM: pt returning call to office

## 2018-10-08 NOTE — Telephone Encounter (Signed)
See reminder list

## 2018-11-02 ENCOUNTER — Other Ambulatory Visit: Payer: Self-pay | Admitting: Family Medicine

## 2018-11-02 DIAGNOSIS — J439 Emphysema, unspecified: Secondary | ICD-10-CM

## 2018-11-02 DIAGNOSIS — F411 Generalized anxiety disorder: Secondary | ICD-10-CM

## 2018-11-14 ENCOUNTER — Telehealth: Payer: Self-pay | Admitting: Family Medicine

## 2018-11-14 NOTE — Telephone Encounter (Signed)
LVM for pt to reschedule appt for 09-14, ptovider not in office this day needing to reschedule.

## 2018-12-01 ENCOUNTER — Ambulatory Visit: Payer: Medicare Other | Admitting: Family Medicine

## 2018-12-08 ENCOUNTER — Other Ambulatory Visit: Payer: Self-pay | Admitting: Family Medicine

## 2018-12-08 ENCOUNTER — Ambulatory Visit (INDEPENDENT_AMBULATORY_CARE_PROVIDER_SITE_OTHER): Payer: Medicare Other | Admitting: Family Medicine

## 2018-12-08 ENCOUNTER — Encounter: Payer: Self-pay | Admitting: Family Medicine

## 2018-12-08 ENCOUNTER — Other Ambulatory Visit: Payer: Self-pay

## 2018-12-08 VITALS — BP 120/76 | HR 88 | Temp 95.9°F | Ht 64.0 in | Wt 118.2 lb

## 2018-12-08 DIAGNOSIS — F411 Generalized anxiety disorder: Secondary | ICD-10-CM | POA: Diagnosis not present

## 2018-12-08 DIAGNOSIS — K219 Gastro-esophageal reflux disease without esophagitis: Secondary | ICD-10-CM | POA: Diagnosis not present

## 2018-12-08 DIAGNOSIS — E039 Hypothyroidism, unspecified: Secondary | ICD-10-CM

## 2018-12-08 DIAGNOSIS — J439 Emphysema, unspecified: Secondary | ICD-10-CM | POA: Diagnosis not present

## 2018-12-08 DIAGNOSIS — E785 Hyperlipidemia, unspecified: Secondary | ICD-10-CM

## 2018-12-08 LAB — LIPID PANEL
Cholesterol: 69 mg/dL (ref 0–200)
HDL: 27.7 mg/dL — ABNORMAL LOW (ref >39.00)
LDL Cholesterol: 31 mg/dL (ref 0–99)
NonHDL: 41.71
Total CHOL/HDL Ratio: 3
Triglycerides: 55 mg/dL (ref 0.0–149.0)
VLDL: 11 mg/dL (ref 0.0–40.0)

## 2018-12-08 LAB — TSH: TSH: 0.54 u[IU]/mL (ref 0.35–4.50)

## 2018-12-08 LAB — T4, FREE: Free T4: 2.04 ng/dL — ABNORMAL HIGH (ref 0.60–1.60)

## 2018-12-08 MED ORDER — INCRUSE ELLIPTA 62.5 MCG/INH IN AEPB: 1.0000 | INHALATION_SPRAY | Freq: Every day | RESPIRATORY_TRACT | 2 refills | Status: DC

## 2018-12-08 MED ORDER — LEVOTHYROXINE SODIUM 50 MCG PO TABS: 50.0000 ug | ORAL_TABLET | Freq: Every day | ORAL | 2 refills | Status: DC

## 2018-12-08 MED ORDER — FAMOTIDINE 20 MG PO TABS: 20.0000 mg | ORAL_TABLET | Freq: Two times a day (BID) | ORAL | 2 refills | Status: DC

## 2018-12-08 MED ORDER — LEVOTHYROXINE SODIUM 25 MCG PO TABS: 25.0000 ug | ORAL_TABLET | Freq: Every day | ORAL | 3 refills | Status: DC

## 2018-12-08 MED ORDER — BUSPIRONE HCL 15 MG PO TABS: 15.0000 mg | ORAL_TABLET | Freq: Three times a day (TID) | ORAL | 2 refills | Status: DC

## 2018-12-08 NOTE — Patient Instructions (Addendum)
Give [place] 2-3 business days to get the results of your labs back.   Keep the diet clean and stay active.  Strong work with quitting smoking!  Consider Pepcid (famotidine) 20 mg twice daily as needed for reflux.  The only lifestyle changes that have data behind them are weight loss for the overweight/obese and elevating the head of the bed. Finding out which foods/positions are triggers is important. Don't lose weight.  Let me know if you are losing weight.  Call your lung doctor's office regarding your breathing/inhaler that you were not able to get.   Let us know if you need anything.

## 2018-12-08 NOTE — Progress Notes (Signed)
Chief Complaint  Patient presents with  . Follow-up  . Fatigue  . Gastroesophageal Reflux    for 3 days  . Breathing Problem    Stressful walking in the office    Subjective: Patient is a 73 y.o. female here for fatigue.  Has had lots of fatigue over past 1 mo. She does have a hx of allergies and COPD. Noticed her breathing was worsened around this same time. She does see pulm, reports compliance with her Incruse. Was unable to get the inhaler from her most recent visit 6 mo ago. Had not followed up with pulm on that. DOE quite severe as of late, cannot walk more than 10 feet without needing rescue inhaler if it had been quite some time from last use. Has stopped smoking 1 mo ago.   Last week had 3 days of waterbrash, indigestion, epigastric abd discomfort. She started taking raniditine and started to feel better. No bleeding, vomiting, diarrhea or current issues.   Hypothyroidism Patient presents for follow-up of hypothyroidism.  Reports compliance with medication- levothyroxine 50 mcg/d.  Current symptoms include: denies fatigue, weight changes, heat/cold intolerance, bowel/skin changes or CVS symptoms She believes her dose should be unchanged  GAD Controlled w BuSpar 15 mg TID. No AE's.  ROS: Const: no fevers Lungs: +DOE   Past Medical History:  Diagnosis Date  . Emphysema of lung ([place])   . Generalized anxiety disorder 05/23/2016  . GERD (gastroesophageal reflux disease) 05/23/2016  . History of pregnancy induced hypertension   . Hypoparathyroidism ([place])    Acquired post surg  . Hypothyroid     Objective: BP 120/76 (BP Location: Left Arm, Patient Position: Sitting, Cuff Size: Normal)   Pulse 88   Temp (!) 95.9 F (35.5 C) (Temporal)   Ht 5\' 4"  (1.626 m)   Wt 118 lb 4 oz (53.6 kg)   SpO2 95%   BMI 20.30 kg/m  General: Awake, appears stated age HEENT: MMM, EOMi Heart: RRR, no LE edema Lungs: CTAB, no rales, wheezes or rhonchi. No accessory muscle use Abd: BS+, S,  NT, ND, no masses or organomegaly Psych: Age appropriate judgment and insight, normal affect and mood  Assessment and Plan: Gastroesophageal reflux disease, esophagitis presence not specified - Plan: famotidine (PEPCID) 20 MG tablet  Generalized anxiety disorder - Plan: busPIRone (BUSPAR) 15 MG tablet  Pulmonary emphysema, unspecified emphysema type ([place]) - Plan: umeclidinium bromide (INCRUSE ELLIPTA) 62.5 MCG/INH AEPB  Hypothyroidism, unspecified type - Plan: levothyroxine (SYNTHROID) 50 MCG tablet, TSH, T4, free  Dyslipidemia - Plan: Lipid panel  Needs to call pulm.  I think her fatigue gets better when her breathing does.  F/u in 6 mo or prn.  The patient voiced understanding and agreement to the plan.  [place], DO 12/08/18  12:40 PM

## 2018-12-09 ENCOUNTER — Other Ambulatory Visit: Payer: Self-pay | Admitting: Family Medicine

## 2018-12-09 DIAGNOSIS — E039 Hypothyroidism, unspecified: Secondary | ICD-10-CM

## 2018-12-16 ENCOUNTER — Telehealth: Payer: Self-pay | Admitting: Pulmonary Disease

## 2018-12-16 NOTE — Telephone Encounter (Signed)
That is fine. Can you check to make sure it does not need to go to DME , probably fine but should make sure as typically cheaper if filed on Medicare B so does not go against "d[place] "   Please contact office for sooner follow up if symptoms do not improve or worsen or seek emergency care

## 2018-12-16 NOTE — Telephone Encounter (Signed)
Called and spoke with Patient.  Patient stated she does not have a neb machine or a DME company to go through for her yupelri and perforomist. Per Epic, referral was placed for neb machine 05/19/18 with neb medications faxed to [place] by BQ. Called and spoke with Levada Dy, [employer].  Patient never got neb machine. Levada Dy stated neb machine order is still good if Patient has OV notes within 6 months.  Patient is just outside 6 months.  Levada Dy stated Patient needed telephone OV with notes stating Patient needed neb machine and nebs. New neb machine order with prescriptions will need to be sent to [employer]. Called and spoke with Patient.  Patient scheduled with Aaron Edelman, NP 12/17/18 at 0930, telephone OV.

## 2018-12-16 NOTE — Telephone Encounter (Signed)
Call returned to patient, confirmed DOB, medication, and pharmacy. Made aware she was due for a follow up visit. She reports she normally see's BQ at [place]. I made her aware we can do the visit over the phone. Appt made. Nothing further needed at this time.   Will route message to app of day to see if we can send refills under her name as BQ is not in clinic.   TP please advise if we can send refills of yupelri and perforomist under your name. (orders has been pended. Patient reports she does not need a call back after refill sent. )

## 2018-12-16 NOTE — Progress Notes (Signed)
Virtual Visit via Telephone Note  I connected with Andrea Ward on 12/17/18 at  9:30 AM EDT by telephone and verified that I am speaking with the correct person using two identifiers.  Location: Patient: Home Provider: Office [facility] - [id number], [place], [place], [place] [address]   I discussed the limitations, risks, security and privacy concerns of performing an evaluation and management service by telephone and the availability of in person appointments. I also discussed with the patient that there may be a patient responsible charge related to this service. The patient expressed understanding and agreed to proceed.  Patient consented to consult via telephone: Yes People present and their role in pt care: Pt   History of Present Illness: 73 year old female former smoker followed in our office for emphysema  Past medical history: GERD, Hypothyroidism Smoking history: Former Smoker.  Quit date August 2020. 37.5 pack year.  Maintenance: Incruse Ellipta  Patient of Dr. [place] Bells  Chief complaint: Needs Neb machine and [name] / [place] script   73 year old female former smoker completing a telephone visit with our office today she contacted our office on 12/16/2018 requesting prescriptions for Yupelri and Brovana nebulized medications.  Patient was prescribed these medications back in March/2020 but she could not afford the medications at that time.  She was seen by primary care on 12/08/2018 where she was noted to have some increased work of breath and occasional wheezing and primary care requested patient to follow-up with pulmonary and to see if we can help her with her maintenance medications.  Since March/2020 patient has been maintained on Incruse Ellipta.  Patient reports she takes that every day.  Over the last week patient has had increase her rescue inhaler use to 2 times daily.  The patient is reporting today that she successfully stopped smoking in August/2020.   We have congratulated her on her success!  She did this on her own with no smoking cessation aids.  She used the reduced to quit method and then stop smoking without any assistance.  Patient reports that her symptoms today she does have a slightly productive cough with clear mucus.  At her baseline she usually does not cough.  She denies any sort of wheezing.  Through chart review I can see the patient had a lung cancer screening CT done in April/2019.  It was a lung RADS 2S.  Patient has not completed her annual CT yet.  I do not see this ordered.  We will get the patient coordinated with our lung cancer screening program in our office.  Observations/Objective:  Chest imaging: 06/2017 lung cancer screening CT : significant centrilobular emphysema; RADS 2S, CAD  PFT: 05/2018 Spirometry: Ratio 49% FEV1 0.8L 42%  Immunization History  Administered Date(s) Administered  . Pneumococcal Conjugate-13 05/23/2016  . Pneumococcal Polysaccharide-23 07/10/2017  . Tdap 05/23/2016    Assessment and Plan:  Former smoker Plan: Congratulations on stopping smoking Established with a lung cancer screening program for future CTs  Pulmonary emphysema ([place]) Plan: We will do a trial of Anoro Ellipta, samples provided today When you start Anoro Ellipta please stop Incruse Ellipta Rescue inhaler as needed We will place an order for a nebulizer Albuterol nebs sent to the pharmacy We will see back in 2 weeks to further review this issue in person  Healthcare maintenance Plan:  Flu vaccine at next ov   Follow Up Instructions:  Return in about 2 weeks (around 12/31/2018), or if symptoms worsen or fail to improve,  for Follow up with Wyn Quaker FNP-C.   I discussed the assessment and treatment plan with the patient. The patient was provided an opportunity to ask questions and all were answered. The patient agreed with the plan and demonstrated an understanding of the instructions.   The patient was  advised to call back or seek an in-person evaluation if the symptoms worsen or if the condition fails to improve as anticipated.  I provided 25 minutes of non-face-to-face time during this encounter.   Lauraine Rinne, NP

## 2018-12-17 ENCOUNTER — Ambulatory Visit (INDEPENDENT_AMBULATORY_CARE_PROVIDER_SITE_OTHER): Payer: Medicare Other | Admitting: Pulmonary Disease

## 2018-12-17 ENCOUNTER — Telehealth: Payer: Self-pay | Admitting: Pulmonary Disease

## 2018-12-17 ENCOUNTER — Other Ambulatory Visit: Payer: Self-pay

## 2018-12-17 ENCOUNTER — Encounter: Payer: Self-pay | Admitting: Pulmonary Disease

## 2018-12-17 DIAGNOSIS — Z87891 Personal history of nicotine dependence: Secondary | ICD-10-CM

## 2018-12-17 DIAGNOSIS — Z0000 Encounter for general adult medical examination without abnormal findings: Secondary | ICD-10-CM | POA: Insufficient documentation

## 2018-12-17 DIAGNOSIS — J439 Emphysema, unspecified: Secondary | ICD-10-CM | POA: Diagnosis not present

## 2018-12-17 MED ORDER — ANORO ELLIPTA 62.5-25 MCG/INH IN AEPB: 1.0000 | INHALATION_SPRAY | Freq: Every day | RESPIRATORY_TRACT | 0 refills | Status: DC

## 2018-12-17 NOTE — Telephone Encounter (Signed)
Patient had a televisit with Aaron Edelman today. She needs to have Yupelri and Perforomist called in for her. I wanted to make sure she wanted [place] to send the RXs to the local pharmacy and not to the DME with her neb machine order.    Will await her call back.

## 2018-12-17 NOTE — Addendum Note (Signed)
Addended by: Valerie Salts on: 12/17/2018 02:30 PM   Modules accepted: Orders

## 2018-12-17 NOTE — Assessment & Plan Note (Signed)
Plan:  Flu vaccine at next ov

## 2018-12-17 NOTE — Addendum Note (Signed)
Addended by: Valerie Salts on: 12/17/2018 09:20 AM   Modules accepted: Orders

## 2018-12-17 NOTE — Assessment & Plan Note (Addendum)
Plan: Congratulations on stopping smoking Established with a lung cancer screening program for future CTs

## 2018-12-17 NOTE — Assessment & Plan Note (Signed)
Plan: We will do a trial of Anoro Ellipta, samples provided today When you start Anoro Ellipta please stop Incruse Ellipta Rescue inhaler as needed We will place an order for a nebulizer Albuterol nebs sent to the pharmacy We will see back in 2 weeks to further review this issue in person

## 2018-12-17 NOTE — Patient Instructions (Addendum)
You were seen today by Lauraine Rinne, NP  for:   1. Pulmonary emphysema, unspecified emphysema type ([place])  - Pulmonary function test; Future  Trial of Anoro Ellipta  >>> Take 1 puff daily in the morning right when you wake up >>>Rinse your mouth out after use >>>This is a daily maintenance inhaler, NOT a rescue inhaler >>>Contact our office if you are having difficulties affording or obtaining this medication >>>It is important for you to be able to take this daily and not miss any doses  Stop Incruse Ellipta when taking Anoro Ellipta   Only use your albuterol as a rescue medication to be used if you can't catch your breath by resting or doing a relaxed purse lip breathing pattern.  - The less you use it, the better it will work when you need it. - Ok to use up to 2 puffs  every 4 hours if you must but call for immediate appointment if use goes up over your usual need - Don't leave home without it !!  (think of it like the spare tire for your car)   We will send a prescription for a nebulizer to a DME company  We will also send a prescription for albuterol nebulized medication that you can use every 6 hours as needed for shortness of breath or wheezing in place of your rescue inhaler   Note your daily symptoms > remember "red flags" for COPD:   >>>Increase in cough >>>increase in sputum production >>>increase in shortness of breath or activity  intolerance.   If you notice these symptoms, please call the office to be seen.     Follow-up in 2 weeks  High-dose flu vaccine at next office visit  Congratulations on stopping smoking   We will order a pulmonary function test pre-to complete over the next 3 months to further evaluate your breathing and compared to the spirometry which she completed in March/2020  2. Former smoker  Engineer, manufacturing job in stopping smoking!  - Ambulatory Referral for [employer]  We will refer you today to our lung cancer screening program and previous  CT in April/2019  >>>This is based off of your 37.5 pack-year smoking history >>> This is a recommendation from the [place] preventative services task force (USPSTF) >>>The USPSTF recommends annual screening for lung cancer with low-dose computed tomography (LDCT) in adults aged 60 to 80 years who have a 30 pack-year smoking history and currently smoke or have quit within the past 15 years. Screening should be discontinued once a person has not smoked for 15 years or develops a health problem that substantially limits life expectancy or the ability or willingness to have curative lung surgery.   Our office will call you and set up an appointment with Eric Form (Nurse Practitioner) who leads this program.  This appointment takes place in our office.  After completing this meeting with Eric Form NP you will get a low-dose CT as the screening >>>We will call you with those results    We recommend today:  Orders Placed This Encounter  Procedures   Ambulatory Referral for Lung Cancer Scre    Referral Priority:   Routine    Referral Type:   Consultation    Referral Reason:   Specialty Services Required    Number of Visits Requested:   1   Pulmonary function test    Standing Status:   Future    Standing Expiration Date:   12/17/2019    Order  Specific Question:   Where should this test be performed?    Answer:   [facility]    Order Specific Question:   Full PFT: includes the following: basic spirometry, spirometry pre & post bronchodilator, diffusion capacity (DLCO), lung volumes    Answer:   Full PFT   Orders Placed This Encounter  Procedures   Ambulatory Referral for Lung Cancer Scre   Pulmonary function test   No orders of the defined types were placed in this encounter.   Follow Up:    Return in about 2 weeks (around 12/31/2018), or if symptoms worsen or fail to improve, for Follow up with Wyn Quaker FNP-C.   Please do your part to reduce the spread of  COVID-19:      Reduce your risk of any infection  and COVID19 by using the similar precautions used for avoiding the common cold or flu:   Wash your hands often with soap and warm water for at least 20 seconds.  If soap and water are not readily available, use an alcohol-based hand sanitizer with at least 60% alcohol.   If coughing or sneezing, cover your mouth and nose by coughing or sneezing into the elbow areas of your shirt or coat, into a tissue or into your sleeve (not your hands).  WEAR A MASK when in public   Avoid shaking hands with others and consider head nods or verbal greetings only.  Avoid touching your eyes, nose, or mouth with unwashed hands.   Avoid close contact with people who are sick.  Avoid places or events with large numbers of people in one location, like concerts or sporting events.  If you have some symptoms but not all symptoms, continue to monitor at home and seek medical attention if your symptoms worsen.  If you are having a medical emergency, call 911.   [place] / e-Visit: [email or web address]         [facility]: [facility]: [id number]                   [facility]: [id number]     It is flu season:   >>> Best ways to protect herself from the flu: Receive the yearly flu vaccine, practice good hand hygiene washing with soap and also using hand sanitizer when available, eat a nutritious meals, get adequate rest, hydrate appropriately   Please contact the office if your symptoms worsen or you have concerns that you are not improving.   Thank you for choosing [facility] for your healthcare, and for allowing [place] to partner with you on your healthcare journey. I am thankful to be able to provide care to you today.   Wyn Quaker FNP-C

## 2018-12-18 NOTE — Progress Notes (Signed)
Reviewed, agree 

## 2018-12-22 ENCOUNTER — Ambulatory Visit: Payer: Medicare Other | Admitting: Pulmonary Disease

## 2018-12-23 ENCOUNTER — Other Ambulatory Visit: Payer: Self-pay | Admitting: *Deleted

## 2018-12-23 DIAGNOSIS — Z122 Encounter for screening for malignant neoplasm of respiratory organs: Secondary | ICD-10-CM

## 2018-12-23 DIAGNOSIS — Z87891 Personal history of nicotine dependence: Secondary | ICD-10-CM

## 2018-12-25 ENCOUNTER — Encounter: Payer: Self-pay | Admitting: Family Medicine

## 2018-12-26 ENCOUNTER — Other Ambulatory Visit: Payer: Self-pay | Admitting: Family Medicine

## 2018-12-26 MED ORDER — LEVOTHYROXINE SODIUM 25 MCG PO TABS: 25.0000 ug | ORAL_TABLET | Freq: Every day | ORAL | 3 refills | Status: DC

## 2018-12-26 NOTE — Telephone Encounter (Signed)
Left message for patient to call back  

## 2019-01-05 ENCOUNTER — Encounter: Payer: Self-pay | Admitting: Acute Care

## 2019-01-05 ENCOUNTER — Ambulatory Visit (INDEPENDENT_AMBULATORY_CARE_PROVIDER_SITE_OTHER): Payer: Medicare Other | Admitting: Pulmonary Disease

## 2019-01-05 ENCOUNTER — Encounter: Payer: Self-pay | Admitting: Pulmonary Disease

## 2019-01-05 ENCOUNTER — Ambulatory Visit
Admission: RE | Admit: 2019-01-05 | Discharge: 2019-01-05 | Disposition: A | Discharge status: Home / Self Care | Payer: Medicare Other | Source: Ambulatory Visit | Attending: Acute Care | Admitting: Acute Care

## 2019-01-05 ENCOUNTER — Other Ambulatory Visit: Payer: Self-pay

## 2019-01-05 ENCOUNTER — Ambulatory Visit (INDEPENDENT_AMBULATORY_CARE_PROVIDER_SITE_OTHER): Payer: Medicare Other | Admitting: Acute Care

## 2019-01-05 ENCOUNTER — Ambulatory Visit: Payer: Medicare Other | Admitting: Pulmonary Disease

## 2019-01-05 VITALS — HR 91 | Ht 64.0 in | Wt 114.6 lb

## 2019-01-05 VITALS — HR 98 | Temp 97.2°F | Ht 64.0 in | Wt 114.0 lb

## 2019-01-05 DIAGNOSIS — Z122 Encounter for screening for malignant neoplasm of respiratory organs: Secondary | ICD-10-CM

## 2019-01-05 DIAGNOSIS — J439 Emphysema, unspecified: Secondary | ICD-10-CM | POA: Diagnosis not present

## 2019-01-05 DIAGNOSIS — Z87891 Personal history of nicotine dependence: Secondary | ICD-10-CM

## 2019-01-05 DIAGNOSIS — Z79899 Other long term (current) drug therapy: Secondary | ICD-10-CM | POA: Diagnosis not present

## 2019-01-05 DIAGNOSIS — Z0000 Encounter for general adult medical examination without abnormal findings: Secondary | ICD-10-CM

## 2019-01-05 MED ORDER — ANORO ELLIPTA 62.5-25 MCG/INH IN AEPB: 1.0000 | INHALATION_SPRAY | Freq: Every day | RESPIRATORY_TRACT | 6 refills | Status: DC

## 2019-01-05 MED ORDER — ANORO ELLIPTA 62.5-25 MCG/INH IN AEPB: 1.0000 | INHALATION_SPRAY | Freq: Every day | RESPIRATORY_TRACT | 0 refills | Status: DC

## 2019-01-05 NOTE — Progress Notes (Signed)
PCCM: Thanks. Glad to establish with her in Dec.  Brad

## 2019-01-05 NOTE — Assessment & Plan Note (Signed)
Plan: Continue Anoro Ellipta Anoro Ellipta coupon, sample provided today Prescription for Anoro sent to pharmacy Complete shared decision-making visit with our office today Complete lung cancer screening CT today Follow-up with our office in 2 months with a pulmonary function test

## 2019-01-05 NOTE — Progress Notes (Signed)
@Patient  ID: Andrea Ward, female    DOB: 11-Sep-1945, 73 y.o.   MRN: [id number]  Chief Complaint  Patient presents with  . Follow-up    F/U for emphysema.     Referring provider: Shelda Pal*  HPI:  73 year old female former smoker followed in our office for emphysema  Past medical history: GERD, Hypothyroidism Smoking history: Former Smoker.  Quit date August 2020. 37.5 pack year.  Maintenance: Celedonio Miyamoto Patient of Dr. Lake Bells  01/05/2019  - Visit   73 year old female former smoker followed in our office for emphysema.  She was trialed on Anoro Ellipta at last televisit.  Patient presenting today for follow-up.  Patient reporting that her cough has increased since the last visit.  She is having a productive cough with clear to yellow phlegm.  She also has a CT lung cancer screening that is scheduled for today.  This is a follow-up from her April/2019 lung cancer screening that requested an annual follow-up /lung RADS 2S.  Patient still needs pulmonary function testing.  This has not been scheduled yet.  Patient needs a flu vaccine this year.  She also needs to be established with a new pulmonologist as Dr. Lake Bells is no longer seeing patients in clinic and will only be working inpatient.  Patient reports that overall her symptoms have improved significantly since using her Anoro Ellipta.  She prefers this over [employer].  She is only had to use her rescue inhaler 2 times over the last 2 weeks since switching to [employer].   Questionaires / Pulmonary Flowsheets:   MMRC: mMRC Dyspnea Scale mMRC Score  01/05/2019 2    Tests:   Chest imaging: 06/2017 lung cancer screening CT : significant centrilobular emphysema; RADS 2S, CAD  PFT: 05/2018 Spirometry: Ratio 49% FEV1 0.8L 42%  FENO:  No results found for: NITRICOXIDE  PFT: No flowsheet data found.  WALK:  SIX MIN WALK 05/19/2018  Supplimental Oxygen during Test? (L/min) No  Tech Comments:  Patient walked at a steady pace. Denied any SOB or chest pain. O2 was not needed during or after walk,.     Imaging: No results found.  Lab Results:  CBC    Component Value Date/Time   WBC 3.7 (L) 07/10/2017 1609   RBC 3.37 (L) 07/10/2017 1609   HGB 13.9 07/10/2017 1609   HCT 40.7 07/10/2017 1609   PLT 297.0 07/10/2017 1609   MCV 120.9 Repeated and verified X2. (H) 07/10/2017 1609   MCH 40.9 (H) 05/15/2016 0830   MCHC 34.1 07/10/2017 1609   RDW 13.9 07/10/2017 1609   LYMPHSABS 0.9 07/10/2017 1609   MONOABS 0.4 07/10/2017 1609   EOSABS 0.1 07/10/2017 1609   BASOSABS 0.0 07/10/2017 1609    BMET    Component Value Date/Time   NA 141 12/12/2017 1325   K 4.8 12/12/2017 1325   CL 101 12/12/2017 1325   CO2 32 12/12/2017 1325   GLUCOSE 128 (H) 12/12/2017 1325   BUN 7 12/12/2017 1325   CREATININE 0.90 12/12/2017 1325   CALCIUM 8.6 12/12/2017 1325   GFRNONAA >60 05/15/2016 0830   GFRAA >60 05/15/2016 0830    BNP No results found for: BNP  ProBNP No results found for: PROBNP  Specialty Problems      Pulmonary Problems   Pharyngeal dysphagia   Pulmonary emphysema ([place])    Chest imaging: 06/2017 lung cancer screening CT : significant centrilobular emphysema; RADS 2S, CAD  PFT: 05/2018 Spirometry: Ratio 49% FEV1 0.8L 42%  No Known Allergies  Immunization History  Administered Date(s) Administered  . Pneumococcal Conjugate-13 05/23/2016  . Pneumococcal Polysaccharide-23 07/10/2017  . Tdap 05/23/2016    Past Medical History:  Diagnosis Date  . Emphysema of lung ([place])   . Generalized anxiety disorder 05/23/2016  . GERD (gastroesophageal reflux disease) 05/23/2016  . History of pregnancy induced hypertension   . Hypoparathyroidism ([place])    Acquired post surg  . Hypothyroid     Tobacco History: Social History   Tobacco Use  Smoking Status Former Smoker  . Packs/day: 0.75  . Years: 50.00  . Pack years: 37.50  . Types: Cigarettes  . Start date:  03/19/1965  . Quit date: 11/10/2018  . Years since quitting: 0.1  Smokeless Tobacco Never Used  Tobacco Comment   stopped smoknig august/2020!   Counseling given: Yes Comment: stopped smoknig august/2020!   Continue to not smoke  Outpatient Encounter Medications as of 01/05/2019  Medication Sig  . albuterol (VENTOLIN HFA) 108 (90 Base) MCG/ACT inhaler INHALE 1 TO 2 PUFFS BY MOUTH EVERY 6 HOURS AS NEEDED FOR WHEEZING OR SHORTNESS OF BREATH  . busPIRone (BUSPAR) 15 MG tablet Take 1 tablet (15 mg total) by mouth 3 (three) times daily.  . calcitRIOL (ROCALTROL) 0.25 MCG capsule Take 1 capsule (0.25 mcg total) by mouth daily. Take 1 capsule by mouth twice daily for 14 days (ends on 12/14/17) then 1 capsule daily.  . Cyanocobalamin (VITAMIN B-12 PO) Take 1 tablet by mouth daily.  . famotidine (PEPCID) 20 MG tablet Take 1 tablet (20 mg total) by mouth 2 (two) times daily.  Marland Kitchen ibuprofen (ADVIL,MOTRIN) 200 MG tablet Take 1 tablet by mouth daily.  Marland Kitchen levothyroxine (SYNTHROID) 25 MCG tablet Take 1 tablet (25 mcg total) by mouth daily before breakfast.  . propranolol (INDERAL) 20 MG tablet Take 1 tablet (20 mg total) by mouth 3 (three) times daily.  . rosuvastatin (CRESTOR) 10 MG tablet Take 1 tablet (10 mg total) by mouth at bedtime.  Marland Kitchen umeclidinium-vilanterol (ANORO ELLIPTA) 62.5-25 MCG/INH AEPB Inhale 1 puff into the lungs daily.  . calcium carbonate (OS-CAL - DOSED IN MG OF ELEMENTAL CALCIUM) 1250 (500 Ca) MG tablet Take 3 tablets by mouth every evening.   . [DISCONTINUED] formoterol (PERFOROMIST) 20 MCG/2ML nebulizer solution Take 2 mLs (20 mcg total) by nebulization 2 (two) times daily. (Patient not taking: Reported on 01/05/2019)  . [DISCONTINUED] levothyroxine (SYNTHROID) 25 MCG tablet Take 1 tablet (25 mcg total) by mouth daily before breakfast.  . [DISCONTINUED] Revefenacin (YUPELRI) 175 MCG/3ML SOLN Inhale 3 mLs into the lungs daily. (Patient not taking: Reported on 01/05/2019)  .  [DISCONTINUED] umeclidinium bromide (INCRUSE ELLIPTA) 62.5 MCG/INH AEPB Inhale 1 puff into the lungs daily.   No facility-administered encounter medications on file as of 01/05/2019.      Review of Systems  Review of Systems  Constitutional: Positive for fatigue. Negative for activity change and fever.  HENT: Negative for sinus pressure, sinus pain and sore throat.   Respiratory: Positive for cough and shortness of breath. Negative for wheezing.   Cardiovascular: Negative for chest pain and palpitations.  Gastrointestinal: Negative for diarrhea, nausea and vomiting.  Musculoskeletal: Negative for arthralgias.  Neurological: Negative for dizziness.  Psychiatric/Behavioral: Negative for sleep disturbance. The patient is not nervous/anxious.      Physical Exam  Pulse 98   Temp (!) 97.2 F (36.2 C) (Temporal)   Ht 5\' 4"  (1.626 m)   Wt 114 lb (51.7 kg)   SpO2 95%  BMI 19.57 kg/m   Wt Readings from Last 5 Encounters:  01/05/19 114 lb (51.7 kg)  12/08/18 118 lb 4 oz (53.6 kg)  08/25/18 121 lb (54.9 kg)  05/26/18 120 lb (54.4 kg)  05/19/18 120 lb (54.4 kg)    BMI Readings from Last 5 Encounters:  01/05/19 19.57 kg/m  12/08/18 20.30 kg/m  08/25/18 20.77 kg/m  05/26/18 20.60 kg/m  05/19/18 20.60 kg/m     Physical Exam Vitals signs and nursing note reviewed.  Constitutional:      General: She is not in acute distress.    Appearance: Normal appearance. She is normal weight.     Comments: Thin elderly female  HENT:     Head: Normocephalic and atraumatic.     Right Ear: Tympanic membrane, ear canal and external ear normal. There is no impacted cerumen.     Left Ear: Tympanic membrane, ear canal and external ear normal. There is no impacted cerumen.     Nose: Nose normal. No congestion or rhinorrhea.     Mouth/Throat:     Mouth: Mucous membranes are dry.     Pharynx: Oropharynx is clear.  Eyes:     Pupils: Pupils are equal, round, and reactive to light.  Neck:      Musculoskeletal: Normal range of motion.  Cardiovascular:     Rate and Rhythm: Normal rate and regular rhythm.     Pulses: Normal pulses.     Heart sounds: Normal heart sounds. No murmur.  Pulmonary:     Effort: Pulmonary effort is normal. No respiratory distress.     Breath sounds: Normal breath sounds. No decreased air movement. No decreased breath sounds, wheezing or rales.  Skin:    General: Skin is warm and dry.     Capillary Refill: Capillary refill takes less than 2 seconds.  Neurological:     General: No focal deficit present.     Mental Status: She is alert and oriented to person, place, and time. Mental status is at baseline.     Gait: Gait abnormal (walks with cane).  Psychiatric:        Mood and Affect: Mood normal.        Behavior: Behavior normal.        Thought Content: Thought content normal.        Judgment: Judgment normal.       Assessment & Plan:   Pulmonary emphysema (HCC) Plan: Continue Anoro Ellipta Anoro Ellipta coupon, sample provided today Prescription for Anoro sent to pharmacy Complete shared decision-making visit with our office today Complete lung cancer screening CT today Follow-up with our office in 2 months with a pulmonary function test   Former smoker Plan: Continue to not smoke Complete lung cancer screening CT today Pulmonary function testing in 2 months with visit to establish with Dr. Valeta Harms  Healthcare maintenance Plan: We recommended flu vaccine today, patient declined  Medication management Plan: Prescription for Anoro Ellipta sent to the pharmacy today Sample of Anoro Ellipta provided today Coupon for Anoro Ellipta provided today [facility]    Return in about 2 months (around 03/07/2019), or if symptoms worsen or fail to improve, for Follow up for PFT, Follow up with Dr. Valeta Harms - 30 min ov.   Lauraine Rinne, NP 01/05/2019   This appointment was 30 minutes long with over 50% of the time in  direct face-to-face patient care, assessment, plan of care, and follow-up.

## 2019-01-05 NOTE — Progress Notes (Signed)
PCCM: Thanks happy to establish care -  Garner Nash, DO [facility] 01/05/2019 5:13 PM

## 2019-01-05 NOTE — Patient Instructions (Addendum)
You were seen today by Lauraine Rinne, NP  for:   1. Pulmonary emphysema, unspecified emphysema type (HCC)  Continue Anoro Ellipta  >>> Take 1 puff daily in the morning right when you wake up >>>Rinse your mouth out after use >>>This is a daily maintenance inhaler, NOT a rescue inhaler >>>Contact our office if you are having difficulties affording or obtaining this medication >>>It is important for you to be able to take this daily and not miss any doses   Do not use incruse ellipta, performist, budesonide, or yupelri  Only use your albuterol as a rescue medication to be used if you can't catch your breath by resting or doing a relaxed purse lip breathing pattern.  - The less you use it, the better it will work when you need it. - Ok to use up to 2 puffs  every 4 hours if you must but call for immediate appointment if use goes up over your usual need - Don't leave home without it !!  (think of it like the spare tire for your car)   Note your daily symptoms > remember "red flags" for COPD:   >>>Increase in cough >>>increase in sputum production >>>increase in shortness of breath or activity  intolerance.   If you notice these symptoms, please call the office to be seen.   We recommend a flu vaccine, you declined   Increase daily physical activity today   - AMB Referral to [facility]  2. Healthcare maintenance  Flu vaccine today - pt declined  3. Former smoker  Continue to not smoke  Complete shared decision making appt with Eric Form NP today  Complete lung cancer screening CT today   4. Medication management  - AMB Referral to [facility]   We recommend today:  Orders Placed This Encounter  Procedures  . AMB Referral to [facility]    Referral Priority:   Routine    Referral Type:   Consultation    Referral Reason:   [facility]    Number of Visits Requested:   1   Orders Placed This Encounter  Procedures  . AMB Referral to  [facility]   No orders of the defined types were placed in this encounter.   Follow Up:    Return in about 2 months (around 03/07/2019), or if symptoms worsen or fail to improve, for Follow up for PFT, Follow up with Dr. Valeta Harms.   Please do your part to reduce the spread of COVID-19:      Reduce your risk of any infection  and COVID19 by using the similar precautions used for avoiding the common cold or flu:  Marland Kitchen Wash your hands often with soap and warm water for at least 20 seconds.  If soap and water are not readily available, use an alcohol-based hand sanitizer with at least 60% alcohol.  . If coughing or sneezing, cover your mouth and nose by coughing or sneezing into the elbow areas of your shirt or coat, into a tissue or into your sleeve (not your hands). Langley Gauss A MASK when in public  . Avoid shaking hands with others and consider head nods or verbal greetings only. . Avoid touching your eyes, nose, or mouth with unwashed hands.  . Avoid close contact with people who are sick. . Avoid places or events with large numbers of people in one location, like concerts or sporting events. . If you have some symptoms but not all  symptoms, continue to monitor at home and seek medical attention if your symptoms worsen. . If you are having a medical emergency, call 911.   [place] / e-Visit: [email or web address]         [facility]: [facility]: [id number]                   [facility]: [id number]     It is flu season:   >>> Best ways to protect herself from the flu: Receive the yearly flu vaccine, practice good hand hygiene washing with soap and also using hand sanitizer when available, eat a nutritious meals, get adequate rest, hydrate appropriately   Please contact the office if your symptoms worsen or you have  concerns that you are not improving.   Thank you for choosing [facility] for your healthcare, and for allowing [place] to partner with you on your healthcare journey. I am thankful to be able to provide care to you today.   Wyn Quaker FNP-C

## 2019-01-05 NOTE — Assessment & Plan Note (Signed)
Plan: We recommended flu vaccine today, patient declined

## 2019-01-05 NOTE — Assessment & Plan Note (Signed)
Plan: Continue to not smoke Complete lung cancer screening CT today Pulmonary function testing in 2 months with visit to establish with Dr. Valeta Harms

## 2019-01-05 NOTE — Assessment & Plan Note (Deleted)
Plan: °Flu vaccine today °

## 2019-01-05 NOTE — Addendum Note (Signed)
Addended by: Valerie Salts on: 01/05/2019 01:50 PM   Modules accepted: Orders

## 2019-01-05 NOTE — Assessment & Plan Note (Signed)
Plan: Prescription for Anoro Ellipta sent to the pharmacy today Sample of Anoro Ellipta provided today Coupon for Anoro Ellipta provided today [facility]

## 2019-01-05 NOTE — Progress Notes (Signed)
Shared Decision Making Visit Lung Cancer Screening Program [phone])   Eligibility:  Age 73 y.o.  Pack Years Smoking History Calculation  (# packs/per year x # years smoked)  Recent History of coughing up blood  no  Unexplained weight loss? no ( >Than 15 pounds within the last 6 months )  Prior History Lung / other cancer no (Diagnosis within the last 5 years already requiring surveillance chest CT Scans).  Smoking Status Former Smoker  Former Smokers: Years since quit: < 1 year  Quit Date: 11/2018  Visit Components:  Discussion included one or more decision making aids. yes  Discussion included risk/benefits of screening. yes  Discussion included potential follow up diagnostic testing for abnormal scans. yes  Discussion included meaning and risk of over diagnosis. yes  Discussion included meaning and risk of False Positives. yes  Discussion included meaning of total radiation exposure. yes  Counseling Included:  Importance of adherence to annual lung cancer LDCT screening. yes  Impact of comorbidities on ability to participate in the program. yes  Ability and willingness to under diagnostic treatment. yes  Smoking Cessation Counseling:  Current Smokers:   Discussed importance of smoking cessation. yes  Information about tobacco cessation classes and interventions provided to patient. yes  Patient provided with "ticket" for LDCT Scan. yes  Symptomatic Patient. no  Counseling NA  Diagnosis Code: Tobacco Use Z72.0  Asymptomatic Patient yes  Counseling (Intermediate counseling: > three minutes counseling) [id number]  Former Smokers:   Discussed the importance of maintaining cigarette abstinence. yes  Diagnosis Code: Personal History of Nicotine Dependence. [id number]  Information about tobacco cessation classes and interventions provided to patient. Yes  Patient provided with "ticket" for LDCT Scan. yes  Written Order for Lung Cancer Screening with LDCT  placed in Epic. Yes (CT Chest Lung Cancer Screening Low Dose W/O CM) [id number] Z12.2-Screening of respiratory organs Z87.891-Personal history of nicotine dependence  Pulse 91   Ht 5\' 4"  (1.626 m)   Wt 114 lb 9.6 oz (52 kg)   SpO2 100%   BMI 19.67 kg/m    I spent 25 minutes of face to face time with Andrea Ward discussing the risks and benefits of lung cancer screening. We viewed a power point together that explained in detail the above noted topics. We took the time to pause the power point at intervals to allow for questions to be asked and answered to ensure understanding. We discussed that she had taken the single most powerful action possible to decrease her risk of developing lung cancer when she quit smoking. I counseled her to remain smoke free, and to contact me if she ever had the desire to smoke again so that I can provide resources and tools to help support the effort to remain smoke free. We discussed the time and location of the scan, and that either  Andrea Glassman RN or I will call with the results within  24-48 hours of receiving them. She has my card and contact information in the event she needs to speak with me, in addition to a copy of the power point we reviewed as a resource. She verbalized understanding of all of the above and had no further questions upon leaving the office.     I explained to the patient that there has been a high incidence of coronary artery disease noted on these exams. I explained that this is a non-gated exam therefore degree or severity cannot be determined. This patient is  Currently on statin therapy.  I have asked the patient to follow-up with their PCP regarding any incidental finding of coronary artery disease and management with diet or medication as they feel is clinically indicated. The patient verbalized understanding of the above and had no further questions.     Magdalen Spatz, NP 01/05/2019 1:43 PM

## 2019-01-05 NOTE — Patient Instructions (Signed)
Thank you for participating in the [facility] Scre[facility]. It was our pleasure to meet you today. We will call you with the results of your scan within the next few days. Your scan will be assigned a Lung RADS category score by the physicians reading the scans.  This Lung RADS score determines follow up scanning.  See below for description of categories, and follow up screening recommendations. We will be in touch to schedule your follow up screening annually or based on recommendations of our providers. We will fax a copy of your scan results to your Primary Care Physician, or the physician who referred you to the program, to ensure they have the results. Please call the office if you have any questions or concerns regarding your scanning experience or results.  Our office number is [phone]. Please speak with Denise Phelps, RN. She is our Lung Cancer Screening RN. If she is unavailable when you call, please have the office staff send her a message. She will return your call at her earliest convenience. Remember, if your scan is normal, we will scan you annually as long as you continue to meet the criteria for the program. (Age 55-77, Current smoker or smoker who has quit within the last 15 years). If you are a smoker, remember, quitting is the single most powerful action that you can take to decrease your risk of lung cancer and other pulmonary, breathing related problems. We know quitting is hard, and we are here to help.  Please let us know if there is anything we can do to help you meet your goal of quitting. If you are a former smoker, congratulations. We are proud of you! Remain smoke free! Remember you can refer friends or family members through the number above.  We will screen them to make sure they meet criteria for the program. Thank you for helping us take better care of you by participating in Lung Screening.  Lung RADS Categories:  Lung RADS 1: no nodules  or definitely non-concerning nodules.  Recommendation is for a repeat annual scan in 12 months.  Lung RADS 2:  nodules that are non-concerning in appearance and behavior with a very low likelihood of becoming an active cancer. Recommendation is for a repeat annual scan in 12 months.  Lung RADS 3: nodules that are probably non-concerning , includes nodules with a low likelihood of becoming an active cancer.  Recommendation is for a 6-month repeat screening scan. Often noted after an upper respiratory illness. We will be in touch to make sure you have no questions, and to schedule your 6-month scan.  Lung RADS 4 A: nodules with concerning findings, recommendation is most often for a follow up scan in 3 months or additional testing based on our provider's assessment of the scan. We will be in touch to make sure you have no questions and to schedule the recommended 3 month follow up scan.  Lung RADS 4 B:  indicates findings that are concerning. We will be in touch with you to schedule additional diagnostic testing based on our provider's  assessment of the scan.   

## 2019-01-08 ENCOUNTER — Other Ambulatory Visit: Payer: Self-pay | Admitting: *Deleted

## 2019-01-08 ENCOUNTER — Telehealth: Payer: Self-pay | Admitting: Acute Care

## 2019-01-08 ENCOUNTER — Telehealth: Payer: Self-pay | Admitting: Pharmacist

## 2019-01-08 DIAGNOSIS — Z87891 Personal history of nicotine dependence: Secondary | ICD-10-CM

## 2019-01-08 DIAGNOSIS — Z122 Encounter for screening for malignant neoplasm of respiratory organs: Secondary | ICD-10-CM

## 2019-01-08 NOTE — Patient Outreach (Addendum)
[facility]) Care Management  [place]   01/08/2019  Andrea Ward [birth year] [id number]  Reason for referral: Medication Assistance Referral source: [facility] Nurse Referral medication(s): Anoro Current insurance:[employer]  HPI:  Patient is a 73 year old female with multiple medical conditions including but not limited to:  GAD, GERD, hypothyroid, pulmonary emphysema, and tremor.  Patient said the pharmacy told her Anoro was going to cost her >$150.00   Objective: No Known Allergies  Medications Reviewed Today    Reviewed by [name], [facility] (Pharmacist) on 01/08/19 at 1122  Med List Status: <None>  Medication Order Taking? Sig Documenting Provider Last Dose Status Informant  albuterol (VENTOLIN HFA) 108 (90 Base) MCG/ACT inhaler [id number] Yes INHALE 1 TO 2 PUFFS BY MOUTH EVERY 6 HOURS AS NEEDED FOR WHEEZING OR SHORTNESS OF BREATH Wendling, Crosby Oyster, DO Taking Active   busPIRone (BUSPAR) 15 MG tablet [id number] Yes Take 1 tablet (15 mg total) by mouth 3 (three) times daily. Shelda Pal, DO Taking Active   calcitRIOL (ROCALTROL) 0.5 MCG capsule [id number] Yes Take 1 tablet daily and 2 tablets daily on alternating days. [provider] Taking Active         Discontinued 01/08/19 1121 (Completed Course)   Cyanocobalamin (VITAMIN B-12 PO) [id number] Yes Take 1 tablet by mouth daily. [provider] Taking Active   famotidine (PEPCID) 20 MG tablet [id number] Yes Take 1 tablet (20 mg total) by mouth 2 (two) times daily. Shelda Pal, DO Taking Active   ibuprofen (ADVIL,MOTRIN) 200 MG tablet [id number] Yes Take 1 tablet by mouth daily. [provider] Taking Active   levothyroxine (SYNTHROID) 75 MCG tablet [id number] Yes Take 1 tablet by mouth daily. [provider] Taking Active   propranolol (INDERAL) 20 MG tablet [id number] Yes Take 1 tablet (20 mg total) by mouth 3 (three) times daily. Shelda Pal,  DO Taking Active   rosuvastatin (CRESTOR) 10 MG tablet [id number] Yes Take 1 tablet (10 mg total) by mouth at bedtime. Serafina Mitchell, MD Taking Active         Discontinued [id number] (Duplicate)   umeclidinium-vilanterol (ANORO ELLIPTA) 62.5-25 MCG/INH AEPB [id number] Yes Inhale 1 puff into the lungs daily. Lauraine Rinne, NP Taking Active           Assessment:  Drugs sorted by system:  Cardiovascular: Propranolol, Rosuvastatin  Pulmonary/Allergy: Albuterol HFA, Anoro  Gastrointestinal: Famotidine,  Endocrine: Levothyroxine  Renal: Calcitriol  Vitamins/Minerals/Supplements: Cyanocobalamin   Medication Assistance Findings:  Medication assistance needs identified: Anoro and Ventolin HFA.  Patient is over income for Extra Help  Anoro and Ventolin HFA are available through [place] ([place] program.  [place] requires patients to spend at least $600 in out-of-pocket medication expenses.   Patient said she had a print out from [facility] with her out-of-pocket expenses but she would have to look for it.    Additional medication assistance options reviewed with patient as warranted:  No other options identified  Plan: I will route patient assistance letter to [place] technician who will coordinate patient assistance program application process for medications listed above.  [facility] pharmacy technician will assist with obtaining all required documents from both patient and provider(s) and submit application(s) once completed.    Follow up with patient in 4 weeks.  [name], PharmD, [place] Clinical Pharmacist [phone]

## 2019-01-08 NOTE — Patient Outreach (Signed)
[place] [facility]) Care Management  01/08/2019  Sachi Maw 24-Aug-1945 [id number]   Referral 01/05/2019 2nd Outreach: 01/08/2019   Telephone Assessment-Successful (Enrollment)  RN spoke with pt today and introduced the purpose for today's call. RN explained services with [facility] for social worker and pharmacy. Pt receptive and stated her needs at this time related to a medication (inhaler-Anoro). RN offered to refer to a [place] for further assistance on her request. RN further enagaed with pt medical condition (COPD). This a new diagnosis and RN offered education and requested to send pt printable information concerning risk, what to do if acute symptoms should occur and discussed the COPD action plan. Pt receptive and indicated she would read the printable information. RN offered case management services with monthly or quarterly call. Pt receptive as a plan of care was generated with all goals discussed. Pt with full understanding that her provider will be updated on her participation in the [facility] program for ongoing services.   RN inquired on obtaining the initial assessment and pt requested a call back next week to complete this task. RN again will sent plan of care to pt's provider and follow up next week for the initial assessment. No further issues to address at this time.  THN CM Care Plan Problem One     Most Recent Value  Care Plan Problem One  Knowledge Deficit related to COPD  Role Documenting the Problem One  Care Management Telephonic Coordinator  Care Plan for Problem One  Active  THN Long Term Goal   Pt will verbalize two symptoms of COPD exacerbation within the next 90 days.  THN Long Term Goal Start Date  01/08/19  Interventions for Problem One Long Term Goal  Will discuss COPD action zone and what to do if acute symptoms should occur. Will send education printable material for EMMI and review accordingly.  THN CM Short Term Goal #1   Pt will keep all medical  appointments scheduled within the next 30 days.  THN CM Short Term Goal #1 Start Date  01/08/19  Interventions for Short Term Goal #1  Will discuss the importance of attending all scheduled appointments to avoid the risk of COPD exacerbation. Will verify sufficient transportation and follow up accordingly.  THN CM Short Term Goal #2   Pt will be receptive to the [facility] pharmacy referral to assist with medication within the next two weeks.  THN CM Short Term Goal #2 Start Date  01/08/19  Interventions for Short Term Goal #2  Will discuss and review all medications and  discussed pt being receptive to the referral for [place] for assistance with her inhaler to prevent acute events from occurring      Raina Mina, RN Care Management Coordinator [place] Office [phone]

## 2019-01-09 NOTE — Telephone Encounter (Signed)
Message routed to [place], [place]. Patient had CT lung screening 01/06/19.

## 2019-01-12 DIAGNOSIS — K922 Gastrointestinal hemorrhage, unspecified: Secondary | ICD-10-CM | POA: Diagnosis not present

## 2019-01-12 DIAGNOSIS — Z681 Body mass index (BMI) 19 or less, adult: Secondary | ICD-10-CM | POA: Diagnosis not present

## 2019-01-12 DIAGNOSIS — R251 Tremor, unspecified: Secondary | ICD-10-CM | POA: Diagnosis not present

## 2019-01-12 DIAGNOSIS — R6883 Chills (without fever): Secondary | ICD-10-CM | POA: Diagnosis not present

## 2019-01-12 DIAGNOSIS — R531 Weakness: Secondary | ICD-10-CM | POA: Diagnosis not present

## 2019-01-12 DIAGNOSIS — R42 Dizziness and giddiness: Secondary | ICD-10-CM | POA: Diagnosis not present

## 2019-01-12 DIAGNOSIS — D649 Anemia, unspecified: Secondary | ICD-10-CM | POA: Diagnosis not present

## 2019-01-12 DIAGNOSIS — E872 Acidosis: Secondary | ICD-10-CM | POA: Diagnosis not present

## 2019-01-12 DIAGNOSIS — K559 Vascular disorder of intestine, unspecified: Secondary | ICD-10-CM | POA: Diagnosis not present

## 2019-01-12 DIAGNOSIS — E876 Hypokalemia: Secondary | ICD-10-CM | POA: Diagnosis not present

## 2019-01-12 DIAGNOSIS — R000 Tachycardia, unspecified: Secondary | ICD-10-CM | POA: Diagnosis not present

## 2019-01-12 DIAGNOSIS — D62 Acute posthemorrhagic anemia: Secondary | ICD-10-CM | POA: Diagnosis not present

## 2019-01-12 DIAGNOSIS — E890 Postprocedural hypothyroidism: Secondary | ICD-10-CM | POA: Diagnosis not present

## 2019-01-12 DIAGNOSIS — L853 Xerosis cutis: Secondary | ICD-10-CM | POA: Diagnosis not present

## 2019-01-12 DIAGNOSIS — K55039 Acute (reversible) ischemia of large intestine, extent unspecified: Secondary | ICD-10-CM | POA: Diagnosis not present

## 2019-01-12 DIAGNOSIS — R5383 Other fatigue: Secondary | ICD-10-CM | POA: Diagnosis not present

## 2019-01-12 DIAGNOSIS — E892 Postprocedural hypoparathyroidism: Secondary | ICD-10-CM | POA: Diagnosis not present

## 2019-01-12 NOTE — Telephone Encounter (Signed)
Pt informed of CT results per Sarah Groce, NP.  PT verbalized understanding.  Copy sent to PCP.  Order placed for 1 yr f/u CT.  

## 2019-01-13 ENCOUNTER — Other Ambulatory Visit: Payer: Self-pay | Admitting: Pharmacy Technician

## 2019-01-13 DIAGNOSIS — R9431 Abnormal electrocardiogram [ECG] [EKG]: Secondary | ICD-10-CM | POA: Diagnosis not present

## 2019-01-13 DIAGNOSIS — D649 Anemia, unspecified: Secondary | ICD-10-CM | POA: Diagnosis not present

## 2019-01-13 DIAGNOSIS — I10 Essential (primary) hypertension: Secondary | ICD-10-CM | POA: Diagnosis present

## 2019-01-13 DIAGNOSIS — Z681 Body mass index (BMI) 19 or less, adult: Secondary | ICD-10-CM | POA: Diagnosis not present

## 2019-01-13 DIAGNOSIS — E878 Other disorders of electrolyte and fluid balance, not elsewhere classified: Secondary | ICD-10-CM | POA: Diagnosis not present

## 2019-01-13 DIAGNOSIS — J90 Pleural effusion, not elsewhere classified: Secondary | ICD-10-CM | POA: Diagnosis not present

## 2019-01-13 DIAGNOSIS — R6883 Chills (without fever): Secondary | ICD-10-CM | POA: Diagnosis not present

## 2019-01-13 DIAGNOSIS — E876 Hypokalemia: Secondary | ICD-10-CM | POA: Diagnosis not present

## 2019-01-13 DIAGNOSIS — K559 Vascular disorder of intestine, unspecified: Secondary | ICD-10-CM | POA: Diagnosis not present

## 2019-01-13 DIAGNOSIS — K2900 Acute gastritis without bleeding: Secondary | ICD-10-CM | POA: Diagnosis not present

## 2019-01-13 DIAGNOSIS — Z7982 Long term (current) use of aspirin: Secondary | ICD-10-CM | POA: Diagnosis not present

## 2019-01-13 DIAGNOSIS — K529 Noninfective gastroenteritis and colitis, unspecified: Secondary | ICD-10-CM | POA: Diagnosis not present

## 2019-01-13 DIAGNOSIS — K2960 Other gastritis without bleeding: Secondary | ICD-10-CM | POA: Diagnosis not present

## 2019-01-13 DIAGNOSIS — K2950 Unspecified chronic gastritis without bleeding: Secondary | ICD-10-CM | POA: Diagnosis not present

## 2019-01-13 DIAGNOSIS — Z20828 Contact with and (suspected) exposure to other viral communicable diseases: Secondary | ICD-10-CM | POA: Diagnosis present

## 2019-01-13 DIAGNOSIS — Z86010 Personal history of colonic polyps: Secondary | ICD-10-CM | POA: Diagnosis not present

## 2019-01-13 DIAGNOSIS — R946 Abnormal results of thyroid function studies: Secondary | ICD-10-CM | POA: Diagnosis present

## 2019-01-13 DIAGNOSIS — I714 Abdominal aortic aneurysm, without rupture: Secondary | ICD-10-CM | POA: Diagnosis not present

## 2019-01-13 DIAGNOSIS — K922 Gastrointestinal hemorrhage, unspecified: Secondary | ICD-10-CM | POA: Diagnosis not present

## 2019-01-13 DIAGNOSIS — E039 Hypothyroidism, unspecified: Secondary | ICD-10-CM | POA: Diagnosis not present

## 2019-01-13 DIAGNOSIS — R7402 Elevation of levels of lactic acid dehydrogenase (LDH): Secondary | ICD-10-CM | POA: Diagnosis present

## 2019-01-13 DIAGNOSIS — R000 Tachycardia, unspecified: Secondary | ICD-10-CM | POA: Diagnosis not present

## 2019-01-13 DIAGNOSIS — R636 Underweight: Secondary | ICD-10-CM | POA: Diagnosis present

## 2019-01-13 DIAGNOSIS — D509 Iron deficiency anemia, unspecified: Secondary | ICD-10-CM | POA: Diagnosis not present

## 2019-01-13 DIAGNOSIS — K55039 Acute (reversible) ischemia of large intestine, extent unspecified: Secondary | ICD-10-CM | POA: Diagnosis not present

## 2019-01-13 DIAGNOSIS — J449 Chronic obstructive pulmonary disease, unspecified: Secondary | ICD-10-CM | POA: Diagnosis not present

## 2019-01-13 DIAGNOSIS — R739 Hyperglycemia, unspecified: Secondary | ICD-10-CM | POA: Diagnosis not present

## 2019-01-13 DIAGNOSIS — K551 Chronic vascular disorders of intestine: Secondary | ICD-10-CM | POA: Diagnosis present

## 2019-01-13 DIAGNOSIS — K2100 Gastro-esophageal reflux disease with esophagitis, without bleeding: Secondary | ICD-10-CM | POA: Diagnosis present

## 2019-01-13 DIAGNOSIS — F419 Anxiety disorder, unspecified: Secondary | ICD-10-CM | POA: Diagnosis present

## 2019-01-13 DIAGNOSIS — E890 Postprocedural hypothyroidism: Secondary | ICD-10-CM | POA: Diagnosis not present

## 2019-01-13 DIAGNOSIS — D62 Acute posthemorrhagic anemia: Secondary | ICD-10-CM | POA: Diagnosis present

## 2019-01-13 DIAGNOSIS — D500 Iron deficiency anemia secondary to blood loss (chronic): Secondary | ICD-10-CM | POA: Diagnosis not present

## 2019-01-13 DIAGNOSIS — K6389 Other specified diseases of intestine: Secondary | ICD-10-CM | POA: Diagnosis not present

## 2019-01-13 DIAGNOSIS — D125 Benign neoplasm of sigmoid colon: Secondary | ICD-10-CM | POA: Diagnosis not present

## 2019-01-13 DIAGNOSIS — R935 Abnormal findings on diagnostic imaging of other abdominal regions, including retroperitoneum: Secondary | ICD-10-CM | POA: Diagnosis not present

## 2019-01-13 DIAGNOSIS — R251 Tremor, unspecified: Secondary | ICD-10-CM | POA: Diagnosis present

## 2019-01-13 DIAGNOSIS — E8809 Other disorders of plasma-protein metabolism, not elsewhere classified: Secondary | ICD-10-CM | POA: Diagnosis not present

## 2019-01-13 DIAGNOSIS — E872 Acidosis: Secondary | ICD-10-CM | POA: Diagnosis not present

## 2019-01-13 DIAGNOSIS — K635 Polyp of colon: Secondary | ICD-10-CM | POA: Diagnosis not present

## 2019-01-13 DIAGNOSIS — R7401 Elevation of levels of liver transaminase levels: Secondary | ICD-10-CM | POA: Diagnosis present

## 2019-01-13 DIAGNOSIS — E1165 Type 2 diabetes mellitus with hyperglycemia: Secondary | ICD-10-CM | POA: Diagnosis present

## 2019-01-13 DIAGNOSIS — R195 Other fecal abnormalities: Secondary | ICD-10-CM | POA: Diagnosis not present

## 2019-01-13 DIAGNOSIS — E892 Postprocedural hypoparathyroidism: Secondary | ICD-10-CM | POA: Diagnosis not present

## 2019-01-13 DIAGNOSIS — K633 Ulcer of intestine: Secondary | ICD-10-CM | POA: Diagnosis not present

## 2019-01-13 DIAGNOSIS — R7989 Other specified abnormal findings of blood chemistry: Secondary | ICD-10-CM | POA: Diagnosis not present

## 2019-01-13 NOTE — Patient Outreach (Signed)
[place] [facility]) Care Management  01/13/2019  Andrea Ward 05-03-1945 [id number]                                      Medication Assistance Referral  Referral From: [place]  Medication/Company: Jearl Klinefelter / [place] Patient application portion:  Mailed Provider application portion: Faxed  to Dr. Shelda Pal Provider address/fax verified via: Office website  Medication/Company: Enid Cutter HFA / [place] Patient application portion:  Mailed Provider application portion: Faxed  to Dr. Shelda Pal Provider address/fax verified via: Office website    Follow up:  Will follow up with patient in 5-10 business days to confirm application(s) have been received.  Andrea Ward, [place] Management [phone]

## 2019-01-14 ENCOUNTER — Other Ambulatory Visit: Payer: Self-pay | Admitting: Family Medicine

## 2019-01-14 DIAGNOSIS — J439 Emphysema, unspecified: Secondary | ICD-10-CM

## 2019-01-15 ENCOUNTER — Other Ambulatory Visit: Payer: Self-pay | Admitting: *Deleted

## 2019-01-15 NOTE — Patient Outreach (Signed)
[place] [facility]) Care Management  01/15/2019  Andrea Ward Jul 28, 1945 [id number]   Telephone Assessment-Unsuccessful  RN attempted outreach call today however unsuccessful. RN able to leave a HIPAA approved voice message requesting a call back.   PLAN: Will follow up once again within the next week with another outreach call.  Raina Mina, RN Care Management Coordinator [place] Office [phone]

## 2019-01-19 ENCOUNTER — Other Ambulatory Visit: Payer: Medicare Other

## 2019-01-20 ENCOUNTER — Telehealth: Payer: Self-pay | Admitting: Family Medicine

## 2019-01-20 NOTE — Telephone Encounter (Signed)
Copied from [place] [id number]. Topic: General - Inquiry >> Jan 20, 2019  8:02 AM Virl Axe D wrote: Reason for CRM: Pt's daughter stated pt will need some home health services when she gets released from [facility]. She would like to know how to get started with this. Please advise. CB#

## 2019-01-20 NOTE — Telephone Encounter (Signed)
Called informed the daughter of PCP instructions. She verbalized understanding.

## 2019-01-20 NOTE — Telephone Encounter (Signed)
She will have a f/u with me and we can set up if the hospital team does not set up. Ty.

## 2019-01-21 ENCOUNTER — Other Ambulatory Visit: Payer: Self-pay | Admitting: Pharmacy Technician

## 2019-01-21 ENCOUNTER — Other Ambulatory Visit: Payer: Self-pay | Admitting: *Deleted

## 2019-01-21 NOTE — Patient Outreach (Signed)
[place] [facility]) Care Management  01/21/2019  Andrea Ward 09-Jan-1946 [id number]    Telephone Assessment-Unsuccessful  RN attempted outreach call however unsuccessful. RN able to leave a HIPAA approved voice message requesting a call back. Will continue to engage at that time.   PLAN:  RN will sen outreach letter and continue to follow up accordingly.  Raina Mina, RN Care Management Coordinator [place] Office [phone]

## 2019-01-21 NOTE — Patient Outreach (Signed)
[place] [facility]) Care Management  01/21/2019  Andrea Ward October 10, 1945 [id number]    Unsuccessful call placed to patient regarding patient assistance application(s) for Anoro and Ventolin HFA with [employer] , HIPAA compliant voicemail left.   Was calling patient to inquire if she had received the applications that were mailed to her on 01/13/2019.  Follow up:  Will followup with 2nd outreach call in 3-7 business days if call is not returned.  Meiling Hendriks P. Skippy [place], [place] Management [phone]

## 2019-01-26 ENCOUNTER — Inpatient Hospital Stay: Payer: Medicare Other | Admitting: Family Medicine

## 2019-01-27 ENCOUNTER — Other Ambulatory Visit: Payer: Self-pay | Admitting: Pharmacy Technician

## 2019-01-27 ENCOUNTER — Telehealth: Payer: Self-pay | Admitting: Family Medicine

## 2019-01-27 DIAGNOSIS — K219 Gastro-esophageal reflux disease without esophagitis: Secondary | ICD-10-CM | POA: Diagnosis not present

## 2019-01-27 DIAGNOSIS — E8809 Other disorders of plasma-protein metabolism, not elsewhere classified: Secondary | ICD-10-CM | POA: Diagnosis not present

## 2019-01-27 DIAGNOSIS — K55039 Acute (reversible) ischemia of large intestine, extent unspecified: Secondary | ICD-10-CM | POA: Diagnosis not present

## 2019-01-27 DIAGNOSIS — E559 Vitamin D deficiency, unspecified: Secondary | ICD-10-CM | POA: Diagnosis not present

## 2019-01-27 DIAGNOSIS — D500 Iron deficiency anemia secondary to blood loss (chronic): Secondary | ICD-10-CM | POA: Diagnosis not present

## 2019-01-27 DIAGNOSIS — J439 Emphysema, unspecified: Secondary | ICD-10-CM | POA: Diagnosis not present

## 2019-01-27 DIAGNOSIS — F419 Anxiety disorder, unspecified: Secondary | ICD-10-CM | POA: Diagnosis not present

## 2019-01-27 DIAGNOSIS — Z87891 Personal history of nicotine dependence: Secondary | ICD-10-CM | POA: Diagnosis not present

## 2019-01-27 DIAGNOSIS — K635 Polyp of colon: Secondary | ICD-10-CM | POA: Diagnosis not present

## 2019-01-27 DIAGNOSIS — E890 Postprocedural hypothyroidism: Secondary | ICD-10-CM | POA: Diagnosis not present

## 2019-01-27 DIAGNOSIS — E892 Postprocedural hypoparathyroidism: Secondary | ICD-10-CM | POA: Diagnosis not present

## 2019-01-27 DIAGNOSIS — R739 Hyperglycemia, unspecified: Secondary | ICD-10-CM | POA: Diagnosis not present

## 2019-01-27 DIAGNOSIS — E876 Hypokalemia: Secondary | ICD-10-CM | POA: Diagnosis not present

## 2019-01-27 DIAGNOSIS — K2960 Other gastritis without bleeding: Secondary | ICD-10-CM | POA: Diagnosis not present

## 2019-01-27 NOTE — Telephone Encounter (Signed)
Copied from [place] [id number]. Topic: Quick Communication - Home Health Verbal Orders >> Jan 27, 2019  1:57 PM Yvette Rack wrote: Caller/Agency: Larene [place] with [facility] Number: [phone] Requesting OT/PT/Skilled Nursing/Social Work/Speech Therapy: skilled nurse Frequency: 1 time a week for 4 weeks and every other week for 2 weeks also requesting medical social worker for evaluation

## 2019-01-27 NOTE — Telephone Encounter (Signed)
Called HHRN informe of PCP verbal ok.

## 2019-01-27 NOTE — Patient Outreach (Addendum)
[place]) Care Management  01/27/2019  Andrea Ward [birth year] [id number]   Unsuccessful call placed to patient regarding patient assistance application(s) for Anoro and Ventolin HFA with [employer].  HIPAA compliant voicemail left. Was calling patient to inquire if she has received the application that was mailed to her.  Follow up:  Will followup with 3rd outreach call in 5-7 business days.  Shun Pletz P. Valisa Karpel, [place] Management [phone]

## 2019-01-28 ENCOUNTER — Other Ambulatory Visit: Payer: Self-pay | Admitting: *Deleted

## 2019-01-28 NOTE — Patient Outreach (Signed)
[place] [facility]) Care Management  01/28/2019  Shelisa Grist [birth year] [id number]    Telephone Assessment  RN has made several attempts to reach pt however unsuccessful and no response to the outreach letter sent to pt early this month. RN has communicated with other discipline (pharmacy) concerning discipline closure.    Raina Mina, RN Care Management Coordinator [place] Office [phone]

## 2019-01-30 ENCOUNTER — Telehealth: Payer: Self-pay | Admitting: *Deleted

## 2019-01-30 NOTE — Telephone Encounter (Signed)
Copied from [place] [id number]. Topic: General - Other >> Jan 30, 2019  4:00 PM Virl Axe D wrote: Reason for CRM: Andrea Ward, Social Worker With [employer] called to report a missed visit. There was no answer on the phone from pt.

## 2019-02-01 NOTE — Telephone Encounter (Signed)
She no showed for [place] too, which is out of character for her. Can we reach out just to see if she is OK? Ty.

## 2019-02-02 ENCOUNTER — Encounter: Payer: Self-pay | Admitting: Family Medicine

## 2019-02-02 ENCOUNTER — Other Ambulatory Visit: Payer: Self-pay

## 2019-02-02 ENCOUNTER — Ambulatory Visit (INDEPENDENT_AMBULATORY_CARE_PROVIDER_SITE_OTHER): Payer: Medicare Other | Admitting: Family Medicine

## 2019-02-02 VITALS — BP 120/82 | HR 103 | Temp 95.1°F | Ht 64.0 in | Wt 112.0 lb

## 2019-02-02 DIAGNOSIS — K559 Vascular disorder of intestine, unspecified: Secondary | ICD-10-CM | POA: Diagnosis not present

## 2019-02-02 DIAGNOSIS — D509 Iron deficiency anemia, unspecified: Secondary | ICD-10-CM | POA: Diagnosis not present

## 2019-02-02 DIAGNOSIS — E200 Idiopathic hypoparathyroidism: Secondary | ICD-10-CM | POA: Diagnosis not present

## 2019-02-02 DIAGNOSIS — E039 Hypothyroidism, unspecified: Secondary | ICD-10-CM | POA: Diagnosis not present

## 2019-02-02 DIAGNOSIS — K922 Gastrointestinal hemorrhage, unspecified: Secondary | ICD-10-CM

## 2019-02-02 LAB — CBC
HCT: 30.5 % — ABNORMAL LOW (ref 36.0–46.0)
Hemoglobin: 9.8 g/dL — ABNORMAL LOW (ref 12.0–15.0)
MCHC: 32.2 g/dL (ref 30.0–36.0)
MCV: 99.9 fl (ref 78.0–100.0)
Platelets: 442 10*3/uL — ABNORMAL HIGH (ref 150.0–400.0)
RBC: 3.06 Mil/uL — ABNORMAL LOW (ref 3.87–5.11)
RDW: 32.8 % — ABNORMAL HIGH (ref 11.5–15.5)
WBC: 6.4 10*3/uL (ref 4.0–10.5)

## 2019-02-02 LAB — T4, FREE: Free T4: 1.04 ng/dL (ref 0.60–1.60)

## 2019-02-02 LAB — COMPREHENSIVE METABOLIC PANEL
ALT: 5 U/L (ref 0–35)
AST: 16 U/L (ref 0–37)
Albumin: 3.6 g/dL (ref 3.5–5.2)
Alkaline Phosphatase: 57 U/L (ref 39–117)
BUN: 12 mg/dL (ref 6–23)
CO2: 33 mEq/L — ABNORMAL HIGH (ref 19–32)
Calcium: 8.3 mg/dL — ABNORMAL LOW (ref 8.4–10.5)
Chloride: 97 mEq/L (ref 96–112)
Creatinine, Ser: 0.81 mg/dL (ref 0.40–1.20)
GFR: 84.18 mL/min (ref >60.00)
Glucose, Bld: 120 mg/dL — ABNORMAL HIGH (ref 70–99)
Potassium: 3.9 mEq/L (ref 3.5–5.1)
Sodium: 141 mEq/L (ref 135–145)
Total Bilirubin: 0.4 mg/dL (ref 0.2–1.2)
Total Protein: 6.9 g/dL (ref 6.0–8.3)

## 2019-02-02 LAB — MAGNESIUM: Magnesium: 1.8 mg/dL (ref 1.5–2.5)

## 2019-02-02 LAB — TSH: TSH: 19.37 u[IU]/mL — ABNORMAL HIGH (ref 0.35–4.50)

## 2019-02-02 NOTE — Telephone Encounter (Signed)
Called the patient and she is doing ok. Did schedule her hospital followup while on the phone for today at 1:30. She did state if she gets phone calls from out of state area codes she does not answer,,,so if an RN calls using her cell number and her area code is not 336 she will not answer. She can discuss with PCP today.

## 2019-02-02 NOTE — Patient Instructions (Signed)
If you do not hear anything about your referral in the next 1-2 weeks, call our office and ask for an update.  Give us 2-3 business days to get the results of your labs back.   Keep the diet clean and stay active.  Let us know if you need anything.  

## 2019-02-02 NOTE — Progress Notes (Signed)
Chief Complaint  Patient presents with  . Hospitalization Follow-up    HPI Andrea Ward is a 73 y.o. y.o. female who presents for a transition of care visit.  Pt was discharged from [facility] on 11/7.  Within 48 business hours of discharge our team attempted to contact pt via telephone to coordinate care and needs x 3. Here w daughter who helps provide hx.  Patient was admitted to [facility] on 01/12/2019 and discharged on 01/24/2019.  She was admitted for presumed GI bleed and symptomatic iron deficiency anemia with a hemoglobin of 4.  She received several blood transfusions in addition to IV fluids.  She initially had a lot of swelling but that is since gone down since discharge.  She started to eat and drink normally again.  She is starting to feel more normal.  She was also found to have longstanding ischemic colitis and hypoparathyroidism.  She had several electrolyte derangements that were corrected while inpatient as well.  Past Medical History:  Diagnosis Date  . Emphysema of lung ([place])   . Generalized anxiety disorder 05/23/2016  . GERD (gastroesophageal reflux disease) 05/23/2016  . History of pregnancy induced hypertension   . Hypoparathyroidism ([place])    Acquired post surg  . Hypothyroid    Past Surgical History:  Procedure Laterality Date  . THYROIDECTOMY  05/17/2016  . TONSILLECTOMY     Family History  Problem Relation Age of Onset  . Hypertension Mother   . Hypertension Father   . Anuerysm Sister    Allergies as of 02/02/2019   No Known Allergies     Medication List       Accurate as of February 02, 2019  3:30 PM. If you have any questions, ask your nurse or doctor.        STOP taking these medications   ibuprofen 200 MG tablet Commonly known as: ADVIL Stopped by: Shelda Pal, DO     TAKE these medications   albuterol 108 (90 Base) MCG/ACT inhaler Commonly known as: VENTOLIN HFA INHALE 1 TO 2 PUFFS BY MOUTH EVERY 6 HOURS  AS NEEDED FOR WHEEZING OR SHORTNESS OF BREATH   Anoro Ellipta 62.5-25 MCG/INH Aepb Generic drug: umeclidinium-vilanterol Inhale 1 puff into the lungs daily.   busPIRone 15 MG tablet Commonly known as: BUSPAR Take 1 tablet (15 mg total) by mouth 3 (three) times daily.   calcitRIOL 0.5 MCG capsule Commonly known as: ROCALTROL Take 1 tablet daily and 2 tablets daily on alternating days.   famotidine 20 MG tablet Commonly known as: PEPCID Take 1 tablet (20 mg total) by mouth 2 (two) times daily.   levothyroxine 75 MCG tablet Commonly known as: SYNTHROID Take 1 tablet by mouth daily.   propranolol 20 MG tablet Commonly known as: INDERAL Take 1 tablet (20 mg total) by mouth 3 (three) times daily.   rosuvastatin 10 MG tablet Commonly known as: Crestor Take 1 tablet (10 mg total) by mouth at bedtime.   VITAMIN B-12 PO Take 1 tablet by mouth daily.       ROS:  Constitutional: No fevers or chills, no weight loss HEENT: No headaches, hearing loss, or runny nose, no sore throat Heart: No chest pain Lungs: No SOB, no cough Abd: No bowel changes, no pain, no N/V GU: No urinary complaints Neuro: No numbness, tingling or weakness Msk: No joint or muscle pain  Objective BP 120/82 (BP Location: Left Arm, Patient Position: Sitting, Cuff Size: Normal)   Pulse Marland Kitchen)  103   Temp (!) 95.1 F (35.1 C) (Temporal)   Ht _0  (1.626 m)   Wt 112 lb (50.8 kg)   SpO2 95%   BMI 19.22 kg/m  General Appearance:  awake, alert, oriented, in no acute distress and well developed, well nourished Skin:  there are no suspicious lesions or rashes of concern Head/face:  NCAT Eyes:  EOMI, PERRLA Ears:  canals and TMs NI Nose/Sinuses:  negative Mouth/Throat:  Mucosa moist, no lesions; pharynx without erythema, edema or exudate. Neck:  neck- supple, no mass, non-tender and no jvd Lungs: Clear to auscultation.  No rales, rhonchi, or wheezing. Normal effort, no accessory muscle use. Heart:  Heart  sounds are normal.  Regular rate and rhythm without murmur, gallop or rub. No bruits. Abdomen:  BS+, soft, NT, ND, no masses or organomegaly Musculoskeletal:  No muscle group atrophy or asymmetry, gait normal Neurologic:  Alert and oriented x 3, gait normal., reflexes normal and symmetric, strength and  sensation grossly normal Psych exam: Nml mood and affect, age appropriate judgment and insight  Gastrointestinal hemorrhage, unspecified gastrointestinal hemorrhage type - Plan: CBC, Comp Met (CMET), CANCELED: Basic Metabolic Panel (BMET)  Hypomagnesemia - Plan: Magnesium  Hypothyroidism, unspecified type - Plan: T4, free, TSH  Iron deficiency anemia, unspecified iron deficiency anemia type - Plan: Ambulatory referral to Hematology  Ischemic colitis [facility]) - Plan: Ambulatory referral to Vascular Surgery  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  Labs pending at the time of discharge have been reviewed or are still pending at the time of this visit.  Follow-up labs and appointments have been ordered and/or coordinated appropriately.  TRANSITIONAL CARE MANAGEMENT CERTIFICATION:  I certify the following are true:   1. Communication with the patient/care giver was made within 2 business days of discharge.  2. Complexity of Medical decision making is moderate.  3. Face to face visit occurred within 14 days of discharge.   F/u as originally scheduled w me. The patient and her daughter voiced understanding and agreement to the plan.  [place], DO 02/02/19 3:30 PM

## 2019-02-03 ENCOUNTER — Telehealth: Payer: Self-pay | Admitting: Family

## 2019-02-03 NOTE — Telephone Encounter (Signed)
Spoke with patient to confirm new patient appt with Judson Roch 11/24 at 1 pm. Patient aware of appt date/time/location

## 2019-02-04 ENCOUNTER — Other Ambulatory Visit: Payer: Self-pay | Admitting: Pharmacy Technician

## 2019-02-04 NOTE — Patient Outreach (Signed)
[place] [facility]) Care Management  02/04/2019  Andrea Ward 05-09-1945 [id number]  Successful outreach call placed to patient in regards to [place] application for Anoro and Ventolin HFA.  Spoke to patient, HIPAA identifiers verified.  Patient informed she has an appointment coming up next week with Dr. Nani Ravens to discuss whether she will be continuing the Anoro. Side note:Dr. Nani Ravens was sent the patient assistance application but he deferred to pulmonary. At present, we are awaiting the return of the document from pulmonary.    She informed she is currently using samples of the medication. She is unsure if she received the patient assistance application that was mailed to her on October 27. She informed it is probably in a stack somewhere.   Informed patient to outreach me if the provider wants to continue her on Anoro and if the medication is cost prohibitive so that we can continue with the patient assistance process if she is interested in doing so. Patient was agreeable to this plan and verbalized understanding. Confirmed patient had name and number.  Due to the patient assistance process winding down for Medicare Part D patients, will route note to [place] that the case is being closed with the option to re open the case if the patient contacts [place] that she is interested in doing so. Will remove myself from care team.  Andrea Ward. Andrea Ward, [place] Management [phone]

## 2019-02-06 ENCOUNTER — Other Ambulatory Visit: Payer: Self-pay

## 2019-02-06 DIAGNOSIS — J439 Emphysema, unspecified: Secondary | ICD-10-CM

## 2019-02-06 MED ORDER — ANORO ELLIPTA 62.5-25 MCG/INH IN AEPB: 1.0000 | INHALATION_SPRAY | Freq: Every day | RESPIRATORY_TRACT | 3 refills | Status: DC

## 2019-02-06 MED ORDER — ALBUTEROL SULFATE HFA 108 (90 BASE) MCG/ACT IN AERS: INHALATION_SPRAY | RESPIRATORY_TRACT | 3 refills | Status: DC

## 2019-02-09 ENCOUNTER — Other Ambulatory Visit: Payer: Self-pay | Admitting: Family

## 2019-02-09 DIAGNOSIS — D649 Anemia, unspecified: Secondary | ICD-10-CM

## 2019-02-09 DIAGNOSIS — D513 Other dietary vitamin B12 deficiency anemia: Secondary | ICD-10-CM

## 2019-02-10 ENCOUNTER — Other Ambulatory Visit: Payer: Self-pay

## 2019-02-10 ENCOUNTER — Inpatient Hospital Stay: Payer: Medicare Other

## 2019-02-10 ENCOUNTER — Inpatient Hospital Stay: Payer: Medicare Other | Attending: Family | Admitting: Family

## 2019-02-10 DIAGNOSIS — R946 Abnormal results of thyroid function studies: Secondary | ICD-10-CM | POA: Diagnosis not present

## 2019-02-10 DIAGNOSIS — D649 Anemia, unspecified: Secondary | ICD-10-CM

## 2019-02-10 DIAGNOSIS — E039 Hypothyroidism, unspecified: Secondary | ICD-10-CM | POA: Diagnosis not present

## 2019-02-10 DIAGNOSIS — D513 Other dietary vitamin B12 deficiency anemia: Secondary | ICD-10-CM

## 2019-02-10 DIAGNOSIS — J449 Chronic obstructive pulmonary disease, unspecified: Secondary | ICD-10-CM | POA: Insufficient documentation

## 2019-02-10 DIAGNOSIS — D509 Iron deficiency anemia, unspecified: Secondary | ICD-10-CM

## 2019-02-10 DIAGNOSIS — K559 Vascular disorder of intestine, unspecified: Secondary | ICD-10-CM | POA: Diagnosis not present

## 2019-02-10 LAB — CBC WITH DIFFERENTIAL (CANCER CENTER ONLY)
Abs Immature Granulocytes: 0.08 10*3/uL — ABNORMAL HIGH (ref 0.00–0.07)
Basophils Absolute: 0 10*3/uL (ref 0.0–0.1)
Basophils Relative: 0 %
Eosinophils Absolute: 0.6 10*3/uL — ABNORMAL HIGH (ref 0.0–0.5)
Eosinophils Relative: 7 %
HCT: 31.8 % — ABNORMAL LOW (ref 36.0–46.0)
Hemoglobin: 9.7 g/dL — ABNORMAL LOW (ref 12.0–15.0)
Immature Granulocytes: 1 %
Lymphocytes Relative: 10 %
Lymphs Abs: 0.7 10*3/uL (ref 0.7–4.0)
MCH: 32.9 pg (ref 26.0–34.0)
MCHC: 30.5 g/dL (ref 30.0–36.0)
MCV: 107.8 fL — ABNORMAL HIGH (ref 80.0–100.0)
Monocytes Absolute: 0.4 10*3/uL (ref 0.1–1.0)
Monocytes Relative: 5 %
Neutro Abs: 5.8 10*3/uL (ref 1.7–7.7)
Neutrophils Relative %: 77 %
Platelet Count: 396 10*3/uL (ref 150–400)
RBC: 2.95 MIL/uL — ABNORMAL LOW (ref 3.87–5.11)
WBC Count: 7.6 10*3/uL (ref 4.0–10.5)
nRBC: 0.4 % — ABNORMAL HIGH (ref 0.0–0.2)

## 2019-02-10 LAB — CMP (CANCER CENTER ONLY)
ALT: 5 U/L (ref 0–44)
AST: 15 U/L (ref 15–41)
Albumin: 3.9 g/dL (ref 3.5–5.0)
Alkaline Phosphatase: 62 U/L (ref 38–126)
Anion gap: 8 (ref 5–15)
BUN: 8 mg/dL (ref 8–23)
CO2: 30 mmol/L (ref 22–32)
Calcium: 7.9 mg/dL — ABNORMAL LOW (ref 8.9–10.3)
Chloride: 105 mmol/L (ref 98–111)
Creatinine: 0.78 mg/dL (ref 0.44–1.00)
GFR, Est AFR Am: >60 mL/min (ref >60)
GFR, Estimated: >60 mL/min (ref >60)
Glucose, Bld: 98 mg/dL (ref 70–99)
Potassium: 4.6 mmol/L (ref 3.5–5.1)
Sodium: 143 mmol/L (ref 135–145)
Total Bilirubin: 0.5 mg/dL (ref 0.3–1.2)
Total Protein: 7.7 g/dL (ref 6.5–8.1)

## 2019-02-10 LAB — RETICULOCYTES
Immature Retic Fract: 30.9 % — ABNORMAL HIGH (ref 2.3–15.9)
RBC.: 2.95 MIL/uL — ABNORMAL LOW (ref 3.87–5.11)
Retic Count, Absolute: 188.8 10*3/uL — ABNORMAL HIGH (ref 19.0–186.0)
Retic Ct Pct: 6.4 % — ABNORMAL HIGH (ref 0.4–3.1)

## 2019-02-10 LAB — FOLATE: Folate: 18.6 ng/mL (ref >5.9)

## 2019-02-10 LAB — VITAMIN B12: Vitamin B-12: 607 pg/mL (ref 180–914)

## 2019-02-10 LAB — SAVE SMEAR(SSMR), FOR PROVIDER SLIDE REVIEW

## 2019-02-10 NOTE — Progress Notes (Signed)
Hematology/Oncology Consultation   Name: Andrea Ward      MRN: [id number]    Location: Room/bed info not found  Date: 02/10/2019 Time:2:12 PM   REFERRING PHYSICIAN: Hart Carwin P. Wendling, DO   REASON FOR CONSULT: Iron deficiency anemia    DIAGNOSIS:  Iron deficiency anemia possibly related gastritis/right sided ischedmic colitis  HISTORY OF PRESENT ILLNESS: Andrea Ward is a very pleasant 73 yo African American female with recent severe anemia. She had Hgb of 4.0 on 10/26 and was hospitalized at [facility]. She received 2 units of blood. She had an endoscopy and colonoscopy which showed right sided ischemic colitis and gastritis. She also had sessile serrated polyps.  She was also found to have an elevated TSH (synthroid had recently been decreased from 75 mcg to 50 mcg), low magnesium, low, calcium and low potassium. This was all addressed during admission and she is being followed by endocrinology.   She is on Synthroid 75 mcg PO daily.  She is symptomatic at this time with fatigue, dry skin, chills, dizziness and mild swelling in both feet.  She denies noting any obvious bleeding. She does bruise easily. No petechiae.  She does not have much of an appetite but states that she has been making herself eat. She is staying hydrated.  She states that her weight usually stays 120-125 lbs She has SOB secondary to COPD. She was a 1/2 ppd smoker but stopped almost completely a couple months ago. She will occasionally smoke 1 cigarette.  No sickle cell disease or trait.  No family history of anemia that she is aware of.  She states that she was adopted by her great uncle as a child so some family history is lost.  She states that her great uncle and two of her first cousins had cancer with unknown primaries.  No fever, chills, n/v, cough, rash, dizziness, SOB, chest pain, palpitations, abdominal pain or changes in bowel or bladder habits.  No tenderness in her extremities. She will sometimes have  numbness and tingling in her fingertips.  No falls or syncopal episodes.  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   ROS: All other 10 point review of systems is negative.   PAST MEDICAL HISTORY:   Past Medical History:  Diagnosis Date  . Emphysema of lung ([place])   . Generalized anxiety disorder 05/23/2016  . GERD (gastroesophageal reflux disease) 05/23/2016  . History of pregnancy induced hypertension   . Hypoparathyroidism ([place])    Acquired post surg  . Hypothyroid     ALLERGIES: No Known Allergies    MEDICATIONS:  Current Outpatient Medications on File Prior to Visit  Medication Sig Dispense Refill  . albuterol (VENTOLIN HFA) 108 (90 Base) MCG/ACT inhaler INHALE 1 TO 2 PUFFS BY MOUTH EVERY 6 HOURS AS NEEDED FOR WHEEZING OR SHORTNESS OF BREATH 24 g 3  . busPIRone (BUSPAR) 15 MG tablet Take 1 tablet (15 mg total) by mouth 3 (three) times daily. 270 tablet 2  . calcitRIOL (ROCALTROL) 0.5 MCG capsule Take 1 tablet daily and 2 tablets daily on alternating days.    . Cyanocobalamin (VITAMIN B-12 PO) Take 1 tablet by mouth daily.    . famotidine (PEPCID) 20 MG tablet Take 1 tablet (20 mg total) by mouth 2 (two) times daily. 180 tablet 2  . levothyroxine (SYNTHROID) 75 MCG tablet Take 1 tablet by mouth daily.    . propranolol (INDERAL) 20 MG tablet Take 1 tablet (20  mg total) by mouth 3 (three) times daily. 270 tablet 0  . rosuvastatin (CRESTOR) 10 MG tablet Take 1 tablet (10 mg total) by mouth at bedtime. 30 tablet 11  . umeclidinium-vilanterol (ANORO ELLIPTA) 62.5-25 MCG/INH AEPB Inhale 1 puff into the lungs daily. 180 each 3   No current facility-administered medications on file prior to visit.      PAST SURGICAL HISTORY Past Surgical History:  Procedure Laterality Date  . THYROIDECTOMY  05/17/2016  . TONSILLECTOMY      FAMILY HISTORY: Family History  Problem Relation Age of Onset  . Hypertension Mother   .  Hypertension Father   . Anuerysm Sister     SOCIAL HISTORY:  reports that she quit smoking about 3 months ago. Her smoking use included cigarettes. She started smoking about 53 years ago. She has a 37.50 pack-year smoking history. She has never used smokeless tobacco. She reports current alcohol use of about 5.0 standard drinks of alcohol per week. She reports previous drug use.  PERFORMANCE STATUS: The patient's performance status is 1 - Symptomatic but completely ambulatory  PHYSICAL EXAM: Most Recent Vital Signs: There were no vitals taken for this visit. There were no vitals taken for this visit.  General Appearance:    Alert, cooperative, no distress, appears stated age  Head:    Normocephalic, without obvious abnormality, atraumatic  Eyes:    PERRL, conjunctiva/corneas clear, EOM's intact, fundi    benign, both eyes  Ears:    Normal TM's and external ear canals, both ears  Nose:   Nares normal, septum midline, mucosa normal, no drainage    or sinus tenderness  Throat:   Lips, mucosa, and tongue normal; teeth and gums normal  Neck:   Supple, symmetrical, trachea midline, no adenopathy;    thyroid:  no enlargement/tenderness/nodules; no carotid   bruit or JVD  Back:     Symmetric, no curvature, ROM normal, no CVA tenderness  Lungs:     Clear to auscultation bilaterally, respirations unlabored  Chest Wall:    No tenderness or deformity   Heart:    Regular rate and rhythm, S1 and S2 normal, no murmur, rub   or gallop  Breast Exam:    No tenderness, masses, or nipple abnormality  Abdomen:     Soft, non-tender, bowel sounds active all four quadrants,    no masses, no organomegaly  Genitalia:    Normal female without lesion, discharge or tenderness  Rectal:    Normal tone, normal prostate, no masses or tenderness;   guaiac negative stool  Extremities:   Extremities normal, atraumatic, no cyanosis or edema  Pulses:   2+ and symmetric all extremities  Skin:   Skin color, texture,  turgor normal, no rashes or lesions  Lymph nodes:   Cervical, supraclavicular, and axillary nodes normal  Neurologic:   CNII-XII intact, normal strength, sensation and reflexes    throughout    LABORATORY DATA:  Results for orders placed or performed in visit on 02/10/19 (from the past 48 hour(s))  CBC with Differential (Cancer Center Only)     Status: Abnormal   Collection Time: 02/10/19  1:41 PM  Result Value Ref Range   WBC Count 7.6 4.0 - 10.5 K/uL   RBC 2.95 (L) 3.87 - 5.11 MIL/uL   Hemoglobin 9.7 (L) 12.0 - 15.0 g/dL   HCT 31.8 (L) 36.0 - 46.0 %   MCV 107.8 (H) 80.0 - 100.0 fL   MCH 32.9 26.0 - 34.0 pg  MCHC 30.5 30.0 - 36.0 g/dL   RDW Not Measured 11.5 - 15.5 %    Comment: dimorphic populations   Platelet Count 396 150 - 400 K/uL   nRBC 0.4 (H) 0.0 - 0.2 %   Neutrophils Relative % 77 %   Neutro Abs 5.8 1.7 - 7.7 K/uL   Lymphocytes Relative 10 %   Lymphs Abs 0.7 0.7 - 4.0 K/uL   Monocytes Relative 5 %   Monocytes Absolute 0.4 0.1 - 1.0 K/uL   Eosinophils Relative 7 %   Eosinophils Absolute 0.6 (H) 0.0 - 0.5 K/uL   Basophils Relative 0 %   Basophils Absolute 0.0 0.0 - 0.1 K/uL   Immature Granulocytes 1 %   Abs Immature Granulocytes 0.08 (H) 0.00 - 0.07 K/uL    Comment: Performed at [facility] at [facility], [address], [place], [place] [address]  Save Smear [facility])     Status: None   Collection Time: 02/10/19  1:41 PM  Result Value Ref Range   Smear Review SMEAR STAINED AND AVAILABLE FOR REVIEW     Comment: Performed at [facility] at [facility], [address] [place], [place] [address]  Reticulocytes     Status: Abnormal   Collection Time: 02/10/19  1:42 PM  Result Value Ref Range   Retic Ct Pct 6.4 (H) 0.4 - 3.1 %   RBC. 2.95 (L) 3.87 - 5.11 MIL/uL   Retic Count, Absolute 188.8 (H) 19.0 - 186.0 K/uL   Immature Retic Fract 30.9 (H) 2.3 - 15.9 %    Comment: Performed at [facility]  at [facility], [address] [place],  [address]      RADIOGRAPHY: No results found.     PATHOLOGY: None  ASSESSMENT/PLAN: Andrea Ward is a very pleasant 73 yo African American female with anemia possibly secondary to right sided ischemic bowel and gastritis.  We will see what all her lab work shows and get her set up for replacement if needed.  She states that she is being referred to GI and is also scheduled to follow-up with her endocrinologist.   We will schedule her follow-up once her results are reviewed.   All questions were answered and she is in agreement with the plab. She will contact our office with any questions or concerns. We can certainly see her sooner if needed.   She was discussed with and also seen by Dr. Marin Olp and he is in agreement with the aforementioned.   Laverna Peace, NP    Addendum: I saw and examined Andrea Ward with Judson Roch.  I think this is a complicated situation.  I think the key is going to be the fact that she has an elevated MCV.  Her MCV is 108.  Her reticulocyte count is about 4%.  Her erythropoietin level is 222.  I do not think she is iron deficient.  I have a sense that we are going to have to consider a bone marrow biopsy with her.  I suspect that she might actually have myelodysplasia.  Again, the elevated MCV I think is the major factor here.  Of note, her vitamin B12 is 607.  We are going to have to get her set up with a bone marrow biopsy.  We have I do not think she has a hemolytic anemia.  I do not think she has any kind of lymphoma.  We spent  about 45 minutes with Andrea Ward.  Again, this is a little complicated.  Hopefully, we get the bone marrow biopsy set up right after Thanksgiving.  We answered all of her questions.  We will plan to get her back once the bone marrow biopsy is done.  Lattie Haw, MD

## 2019-02-11 ENCOUNTER — Ambulatory Visit: Payer: Medicare Other | Admitting: Family Medicine

## 2019-02-11 LAB — HEMOGLOBINOPATHY EVALUATION
Hgb A2 Quant: 1.9 % (ref 1.8–3.2)
Hgb A: 97.2 % (ref 96.4–98.8)
Hgb C: 0 %
Hgb F Quant: 0.9 % (ref 0.0–2.0)
Hgb S Quant: 0 %
Hgb Variant: 0 %

## 2019-02-11 LAB — FERRITIN: Ferritin: 131 ng/mL (ref 11–307)

## 2019-02-11 LAB — IRON AND TIBC
Iron: 177 ug/dL — ABNORMAL HIGH (ref 41–142)
Saturation Ratios: 58 % — ABNORMAL HIGH (ref 21–57)
TIBC: 305 ug/dL (ref 236–444)
UIBC: 128 ug/dL (ref 120–384)

## 2019-02-11 LAB — ERYTHROPOIETIN: Erythropoietin: 221.7 m[IU]/mL — ABNORMAL HIGH (ref 2.6–18.5)

## 2019-02-17 ENCOUNTER — Other Ambulatory Visit: Payer: Self-pay | Admitting: Family

## 2019-02-17 DIAGNOSIS — D469 Myelodysplastic syndrome, unspecified: Secondary | ICD-10-CM

## 2019-02-19 LAB — ALPHA-THALASSEMIA GENOTYPR

## 2019-02-23 ENCOUNTER — Ambulatory Visit: Payer: Self-pay | Admitting: Pharmacist

## 2019-02-23 ENCOUNTER — Other Ambulatory Visit: Payer: Self-pay | Admitting: Pharmacist

## 2019-02-23 NOTE — Patient Outreach (Addendum)
[facility]) Care Management  02/23/2019  Andrea Ward [birth year] [id number]   Patient's case is being closed because her applications were not returned for Anoro. The 2020 Patient Assistance year is drawing to a close. To qualify for Anoro, patient's have to at least $600 in out-of-pocket medication expenses.   We will gladly reopen her case upon request.  Plan: Close case.  [name], PharmD, [place] Clinical Pharmacist [phone]

## 2019-02-24 ENCOUNTER — Other Ambulatory Visit: Payer: Self-pay

## 2019-02-24 ENCOUNTER — Inpatient Hospital Stay: Payer: Medicare Other | Attending: Family

## 2019-02-24 DIAGNOSIS — D469 Myelodysplastic syndrome, unspecified: Secondary | ICD-10-CM

## 2019-02-24 DIAGNOSIS — D519 Vitamin B12 deficiency anemia, unspecified: Secondary | ICD-10-CM | POA: Diagnosis not present

## 2019-02-24 DIAGNOSIS — D649 Anemia, unspecified: Secondary | ICD-10-CM | POA: Diagnosis not present

## 2019-02-25 ENCOUNTER — Other Ambulatory Visit: Payer: Self-pay | Admitting: Radiology

## 2019-02-25 ENCOUNTER — Ambulatory Visit: Payer: Self-pay | Admitting: Pharmacist

## 2019-02-26 ENCOUNTER — Ambulatory Visit (HOSPITAL_COMMUNITY)
Admission: RE | Admit: 2019-02-26 | Discharge: 2019-02-26 | Disposition: A | Discharge status: Home / Self Care | Payer: Medicare Other | Source: Ambulatory Visit | Attending: Family | Admitting: Family

## 2019-02-26 ENCOUNTER — Encounter (HOSPITAL_COMMUNITY): Payer: Self-pay

## 2019-02-26 ENCOUNTER — Other Ambulatory Visit: Payer: Self-pay

## 2019-02-26 DIAGNOSIS — E039 Hypothyroidism, unspecified: Secondary | ICD-10-CM | POA: Insufficient documentation

## 2019-02-26 DIAGNOSIS — F411 Generalized anxiety disorder: Secondary | ICD-10-CM | POA: Diagnosis not present

## 2019-02-26 DIAGNOSIS — Z79890 Hormone replacement therapy (postmenopausal): Secondary | ICD-10-CM | POA: Diagnosis not present

## 2019-02-26 DIAGNOSIS — K2960 Other gastritis without bleeding: Secondary | ICD-10-CM | POA: Diagnosis not present

## 2019-02-26 DIAGNOSIS — E559 Vitamin D deficiency, unspecified: Secondary | ICD-10-CM | POA: Diagnosis not present

## 2019-02-26 DIAGNOSIS — Z79899 Other long term (current) drug therapy: Secondary | ICD-10-CM | POA: Diagnosis not present

## 2019-02-26 DIAGNOSIS — E892 Postprocedural hypoparathyroidism: Secondary | ICD-10-CM | POA: Diagnosis not present

## 2019-02-26 DIAGNOSIS — F419 Anxiety disorder, unspecified: Secondary | ICD-10-CM | POA: Diagnosis not present

## 2019-02-26 DIAGNOSIS — K219 Gastro-esophageal reflux disease without esophagitis: Secondary | ICD-10-CM | POA: Insufficient documentation

## 2019-02-26 DIAGNOSIS — E890 Postprocedural hypothyroidism: Secondary | ICD-10-CM | POA: Diagnosis not present

## 2019-02-26 DIAGNOSIS — E209 Hypoparathyroidism, unspecified: Secondary | ICD-10-CM | POA: Insufficient documentation

## 2019-02-26 DIAGNOSIS — J439 Emphysema, unspecified: Secondary | ICD-10-CM | POA: Insufficient documentation

## 2019-02-26 DIAGNOSIS — D469 Myelodysplastic syndrome, unspecified: Secondary | ICD-10-CM | POA: Diagnosis not present

## 2019-02-26 DIAGNOSIS — E876 Hypokalemia: Secondary | ICD-10-CM | POA: Diagnosis not present

## 2019-02-26 DIAGNOSIS — E8809 Other disorders of plasma-protein metabolism, not elsewhere classified: Secondary | ICD-10-CM | POA: Diagnosis not present

## 2019-02-26 DIAGNOSIS — Z87891 Personal history of nicotine dependence: Secondary | ICD-10-CM | POA: Insufficient documentation

## 2019-02-26 DIAGNOSIS — D649 Anemia, unspecified: Secondary | ICD-10-CM | POA: Diagnosis not present

## 2019-02-26 DIAGNOSIS — D539 Nutritional anemia, unspecified: Secondary | ICD-10-CM | POA: Diagnosis present

## 2019-02-26 DIAGNOSIS — R739 Hyperglycemia, unspecified: Secondary | ICD-10-CM | POA: Diagnosis not present

## 2019-02-26 DIAGNOSIS — K55039 Acute (reversible) ischemia of large intestine, extent unspecified: Secondary | ICD-10-CM | POA: Diagnosis not present

## 2019-02-26 DIAGNOSIS — K635 Polyp of colon: Secondary | ICD-10-CM | POA: Diagnosis not present

## 2019-02-26 DIAGNOSIS — D500 Iron deficiency anemia secondary to blood loss (chronic): Secondary | ICD-10-CM | POA: Diagnosis not present

## 2019-02-26 DIAGNOSIS — D72822 Plasmacytosis: Secondary | ICD-10-CM | POA: Diagnosis not present

## 2019-02-26 LAB — PROTIME-INR
INR: 0.9 (ref 0.8–1.2)
Prothrombin Time: 12.1 seconds (ref 11.4–15.2)

## 2019-02-26 LAB — CBC WITH DIFFERENTIAL/PLATELET
Abs Immature Granulocytes: 0.02 10*3/uL (ref 0.00–0.07)
Basophils Absolute: 0 10*3/uL (ref 0.0–0.1)
Basophils Relative: 0 %
Eosinophils Absolute: 0.2 10*3/uL (ref 0.0–0.5)
Eosinophils Relative: 3 %
HCT: 34.7 % — ABNORMAL LOW (ref 36.0–46.0)
Hemoglobin: 10.8 g/dL — ABNORMAL LOW (ref 12.0–15.0)
Immature Granulocytes: 0 %
Lymphocytes Relative: 6 %
Lymphs Abs: 0.5 10*3/uL — ABNORMAL LOW (ref 0.7–4.0)
MCH: 36 pg — ABNORMAL HIGH (ref 26.0–34.0)
MCHC: 31.1 g/dL (ref 30.0–36.0)
MCV: 115.7 fL — ABNORMAL HIGH (ref 80.0–100.0)
Monocytes Absolute: 0.3 10*3/uL (ref 0.1–1.0)
Monocytes Relative: 4 %
Neutro Abs: 6.2 10*3/uL (ref 1.7–7.7)
Neutrophils Relative %: 87 %
Platelets: 318 10*3/uL (ref 150–400)
RBC: 3 MIL/uL — ABNORMAL LOW (ref 3.87–5.11)
RDW: 24.3 % — ABNORMAL HIGH (ref 11.5–15.5)
WBC: 7.2 10*3/uL (ref 4.0–10.5)
nRBC: 0 % (ref 0.0–0.2)

## 2019-02-26 MED ORDER — FENTANYL CITRATE (PF) 100 MCG/2ML IJ SOLN
INTRAMUSCULAR | Status: AC
  Filled 2019-02-26: qty 2

## 2019-02-26 MED ORDER — NALOXONE HCL 0.4 MG/ML IJ SOLN
INTRAMUSCULAR | Status: AC
  Filled 2019-02-26: qty 1

## 2019-02-26 MED ORDER — FENTANYL CITRATE (PF) 100 MCG/2ML IJ SOLN
INTRAMUSCULAR | Status: AC | PRN
  Administered 2019-02-26 (×2): 50 ug via INTRAVENOUS

## 2019-02-26 MED ORDER — FLUMAZENIL 0.5 MG/5ML IV SOLN
INTRAVENOUS | Status: AC
  Filled 2019-02-26: qty 5

## 2019-02-26 MED ORDER — LIDOCAINE HCL (PF) 1 % IJ SOLN
INTRAMUSCULAR | Status: AC | PRN
  Administered 2019-02-26: 10 mL

## 2019-02-26 MED ORDER — SODIUM CHLORIDE 0.9 % IV SOLN
INTRAVENOUS | Status: DC
  Administered 2019-02-26: 08:00:00 via INTRAVENOUS

## 2019-02-26 MED ORDER — MIDAZOLAM HCL 2 MG/2ML IJ SOLN
INTRAMUSCULAR | Status: AC
  Filled 2019-02-26: qty 2

## 2019-02-26 MED ORDER — MIDAZOLAM HCL 2 MG/2ML IJ SOLN
INTRAMUSCULAR | Status: AC | PRN
  Administered 2019-02-26 (×2): 1 mg via INTRAVENOUS

## 2019-02-26 NOTE — Procedures (Signed)
Interventional Radiology Procedure Note  Procedure: CT guided aspirate and core biopsy of right posterior iliac bone Complications: None Recommendations: - Bedrest supine x 1 hrs - OTC's PRN  Pain - Follow biopsy results  Signed,  Demir Titsworth S. Adeyemi Hamad, DO    

## 2019-02-26 NOTE — Discharge Instructions (Signed)
Bone Marrow Aspiration and Bone Marrow Biopsy, Adult, Care After °This sheet gives you information about how to care for yourself after your procedure. Your health care provider may also give you more specific instructions. If you have problems or questions, contact your health care provider. °What can I expect after the procedure? °After the procedure, it is common to have: °· Mild pain and tenderness. °· Swelling. °· Bruising. °Follow these instructions at home: °Puncture site care ° °  ° °· Follow instructions from your health care provider about how to take care of the puncture site. Make sure you: °? Wash your hands with soap and water before you change your bandage (dressing). If soap and water are not available, use hand sanitizer. °? Change your dressing as told by your health care provider. °· Check your puncture site every day for signs of infection. Check for: °? More redness, swelling, or pain. °? More fluid or blood. °? Warmth. °? Pus or a bad smell. °General instructions °· Take over-the-counter and prescription medicines only as told by your health care provider. °· Do not take baths, swim, or use a hot tub until your health care provider approves. Ask if you can take a shower or have a sponge bath. °· Return to your normal activities as told by your health care provider. Ask your health care provider what activities are safe for you. °· Do not drive for 24 hours if you were given a medicine to help you relax (sedative) during your procedure. °· Keep all follow-up visits as told by your health care provider. This is important. °Contact a health care provider if: °· Your pain is not controlled with medicine. °Get help right away if: °· You have a fever. °· You have more redness, swelling, or pain around the puncture site. °· You have more fluid or blood coming from the puncture site. °· Your puncture site feels warm to the touch. °· You have pus or a bad smell coming from the puncture site. °These  symptoms may represent a serious problem that is an emergency. Do not wait to see if the symptoms will go away. Get medical help right away. Call your local emergency services (911 in the [place] Do not drive yourself to the hospital. °Summary °· After the procedure, it is common to have mild pain, tenderness, swelling, and bruising. °· Follow instructions from your health care provider about how to take care of the puncture site. °· Get help right away if you have any symptoms of infection or if you have more blood or fluid coming from the puncture site. °This information is not intended to replace advice given to you by your health care provider. Make sure you discuss any questions you have with your health care provider. °Document Released: 09/22/2004 Document Revised: 06/18/2017 Document Reviewed: 08/17/2015 °Elsevier Patient Education © 2020 Elsevier Inc. ° ° ° ° °Moderate Conscious Sedation, Adult, Care After °These instructions provide you with information about caring for yourself after your procedure. Your health care provider may also give you more specific instructions. Your treatment has been planned according to current medical practices, but problems sometimes occur. Call your health care provider if you have any problems or questions after your procedure. °What can I expect after the procedure? °After your procedure, it is common: °· To feel sleepy for several hours. °· To feel clumsy and have poor balance for several hours. °· To have poor judgment for several hours. °· To vomit if you eat too soon. °  Follow these instructions at home: °For at least 24 hours after the procedure: ° °· Do not: °? Participate in activities where you could fall or become injured. °? Drive. °? Use heavy machinery. °? Drink alcohol. °? Take sleeping pills or medicines that cause drowsiness. °? Make important decisions or sign legal documents. °? Take care of children on your own. °· Rest. °Eating and drinking °· Follow the  diet recommended by your health care provider. °· If you vomit: °? Drink water, juice, or soup when you can drink without vomiting. °? Make sure you have little or no nausea before eating solid foods. °General instructions °· Have a responsible adult stay with you until you are awake and alert. °· Take over-the-counter and prescription medicines only as told by your health care provider. °· If you smoke, do not smoke without supervision. °· Keep all follow-up visits as told by your health care provider. This is important. °Contact a health care provider if: °· You keep feeling nauseous or you keep vomiting. °· You feel light-headed. °· You develop a rash. °· You have a fever. °Get help right away if: °· You have trouble breathing. °This information is not intended to replace advice given to you by your health care provider. Make sure you discuss any questions you have with your health care provider. °Document Released: 12/24/2012 Document Revised: 02/15/2017 Document Reviewed: 06/25/2015 °Elsevier Patient Education © 2020 Elsevier Inc. ° °

## 2019-02-26 NOTE — H&P (Signed)
Chief Complaint: Patient was seen in consultation today for symptomatic anemia/bone marrow biopsy and aspiration.  Referring Physician(s): [place] M  Supervising Physician: Corrie Mckusick  Patient Status: [facility] - Out-pt  History of Present Illness: Andrea Ward is a 73 y.o. female with a past medical history of emphysema, GERD, anemia, hypothyroidism, hypoparathyroidism, and GAD. She was recently admitted to [facility] 10/26 for management of symptomatic anemia (hgb 4.0, symptoms of fatigue, dry skin, chills, and dizziness). Once discharged, she was referred to hematology/oncology on an outpatient basis for further management. She was seen by Laverna Peace, NP and Dr. Marin Olp 02/10/2019, who recommended IR bone marrow biopsy/aspiration to evaluate causes of symptomatic anemia.  IR requested by Laverna Peace, NP for possible image-guided bone marrow biopsy/aspiration. Patient awake and alert laying in bed. Complains of dyspnea, stable at this time. States she has COPD and this is normal for her. Denies fever, chills, chest pain, abdominal pain, or headache.   Past Medical History:  Diagnosis Date   Emphysema of lung ([place])    Generalized anxiety disorder 05/23/2016   GERD (gastroesophageal reflux disease) 05/23/2016   History of pregnancy induced hypertension    Hypoparathyroidism ([facility])    Acquired post surg   Hypothyroid     Past Surgical History:  Procedure Laterality Date   THYROIDECTOMY  05/17/2016   TONSILLECTOMY      Allergies: Patient has no known allergies.  Medications: Prior to Admission medications   Medication Sig Start Date End Date Taking? Authorizing Provider  albuterol (VENTOLIN HFA) 108 (90 Base) MCG/ACT inhaler INHALE 1 TO 2 PUFFS BY MOUTH EVERY 6 HOURS AS NEEDED FOR WHEEZING OR SHORTNESS OF BREATH 02/06/19  Yes Lauraine Rinne, NP  busPIRone (BUSPAR) 15 MG tablet Take 1 tablet (15 mg total) by mouth 3 (three) times daily. 12/08/18  Yes  Shelda Pal, DO  calcitRIOL (ROCALTROL) 0.5 MCG capsule Take 1 tablet daily and 2 tablets daily on alternating days. 09/09/18  Yes [provider]  Cyanocobalamin (VITAMIN B-12 PO) Take 1 tablet by mouth daily.   Yes [provider]  famotidine (PEPCID) 20 MG tablet Take 1 tablet (20 mg total) by mouth 2 (two) times daily. 12/08/18  Yes Shelda Pal, DO  levothyroxine (SYNTHROID) 75 MCG tablet Take 1 tablet by mouth daily. 11/26/18  Yes [provider]  propranolol (INDERAL) 20 MG tablet Take 1 tablet (20 mg total) by mouth 3 (three) times daily. 10/08/18  Yes Shelda Pal, DO  rosuvastatin (CRESTOR) 10 MG tablet Take 1 tablet (10 mg total) by mouth at bedtime. 08/25/18 08/25/19 Yes Serafina Mitchell, MD  umeclidinium-vilanterol (ANORO ELLIPTA) 62.5-25 MCG/INH AEPB Inhale 1 puff into the lungs daily. 02/06/19  Yes Lauraine Rinne, NP     Family History  Problem Relation Age of Onset   Hypertension Mother    Hypertension Father    Anuerysm Sister     Social History   Socioeconomic History   Marital status: Single    Spouse name: Not on file   Number of children: Not on file   Years of education: Not on file   Highest education level: Not on file  Occupational History   Not on file  Tobacco Use   Smoking status: Former Smoker    Packs/day: 0.75    Years: 50.00    Pack years: 37.50    Types: Cigarettes    Start date: 03/19/1965    Quit date: 11/10/2018    Years since quitting: 0.2  Smokeless tobacco: Never Used   Tobacco comment: stopped smoknig august/2020!  Substance and Sexual Activity   Alcohol use: Yes    Alcohol/week: 5.0 standard drinks    Types: 5 Glasses of wine per week   Drug use: Not Currently   Sexual activity: Not Currently  Other Topics Concern   Not on file  Social History Narrative   Not on file   Social Determinants of Health   Financial Resource Strain:    Difficulty of Paying Living  Expenses: Not on file  Food Insecurity:    Worried About [place] in the Last Year: Not on file   Ran Out of Food in the Last Year: Not on file  Transportation Needs:    Lack of Transportation (Medical): Not on file   Lack of Transportation (Non-Medical): Not on file  Physical Activity:    Days of Exercise per Week: Not on file   Minutes of Exercise per Session: Not on file  Stress:    Feeling of Stress : Not on file  Social Connections:    Frequency of Communication with Friends and Family: Not on file   Frequency of Social Gatherings with Friends and Family: Not on file   Attends Religious Services: Not on file   Active Member of Clubs or Organizations: Not on file   Attends Archivist Meetings: Not on file   Marital Status: Not on file     Review of Systems: A 12 point ROS discussed and pertinent positives are indicated in the HPI above.  All other systems are negative.  Review of Systems  Constitutional: Negative for chills and fever.  Respiratory: Positive for shortness of breath. Negative for wheezing.   Cardiovascular: Negative for chest pain and palpitations.  Gastrointestinal: Negative for abdominal pain.  Neurological: Negative for headaches.  Psychiatric/Behavioral: Negative for behavioral problems and confusion.    Vital Signs: BP (!) 150/67 (BP Location: Right Arm)    Pulse (!) 117    Temp 98 F (36.7 C) (Oral)    Ht 5' 4"  (1.626 m)    Wt 110 lb (49.9 kg)    SpO2 97%    BMI 18.88 kg/m   Physical Exam Vitals and nursing note reviewed.  Constitutional:      General: She is not in acute distress.    Appearance: Normal appearance.  Cardiovascular:     Rate and Rhythm: Normal rate and regular rhythm.     Heart sounds: Normal heart sounds. No murmur.  Pulmonary:     Effort: Pulmonary effort is normal. No respiratory distress.     Breath sounds: Normal breath sounds. No wheezing.  Skin:    General: Skin is warm and dry.    Neurological:     Mental Status: She is alert and oriented to person, place, and time.  Psychiatric:        Mood and Affect: Mood normal.        Behavior: Behavior normal.      MD Evaluation Airway: WNL Heart: WNL Abdomen: WNL Chest/ Lungs: WNL ASA  Classification: 2 Mallampati/Airway Score: Two   Imaging: No results found.  Labs:  CBC: Recent Labs    02/02/19 1405 02/10/19 1341 02/26/19 0805  WBC 6.4 7.6 7.2  HGB 9.8* 9.7* 10.8*  HCT 30.5* 31.8* 34.7*  PLT 442.0* 396 318    COAGS: Recent Labs    02/26/19 0805  INR 0.9    BMP: Recent Labs    02/02/19 1405 02/10/19  1341  NA 141 143  K 3.9 4.6  CL 97 105  CO2 33* 30  GLUCOSE 120* 98  BUN 12 8  CALCIUM 8.3* 7.9*  CREATININE 0.81 0.78  GFRNONAA  --  >60  GFRAA  --  >60    LIVER FUNCTION TESTS: Recent Labs    02/02/19 1405 02/10/19 1341  BILITOT 0.4 0.5  AST 16 15  ALT 5 5  ALKPHOS 57 62  PROT 6.9 7.7  ALBUMIN 3.6 3.9     Assessment and Plan:  Symptomatic anemia. Plan for image-guided bone marrow biopsy and aspiration today in IR. Patient is NPO. Afebrile and WBCs WNL. She does not take blood thinners. INR 0.9 today.  Risks and benefits discussed with the patient including, but not limited to bleeding, infection, damage to adjacent structures or low yield requiring additional tests. All of the patient's questions were answered, patient is agreeable to proceed. Consent signed and in chart.   Thank you for this interesting consult.  I greatly enjoyed meeting Andrea Ward and look forward to participating in their care.  A copy of this report was sent to the requesting provider on this date.  Electronically Signed: Earley Abide, PA-C 02/26/2019, 8:56 AM   I spent a total of 40 Minutes in face to face in clinical consultation, greater than 50% of which was counseling/coordinating care for symptomatic anemia/bone marrow biopsy and aspiration.

## 2019-03-02 ENCOUNTER — Inpatient Hospital Stay (HOSPITAL_COMMUNITY): Admission: RE | Admit: 2019-03-02 | Payer: Medicare Other | Source: Ambulatory Visit

## 2019-03-02 LAB — SURGICAL PATHOLOGY

## 2019-03-03 DIAGNOSIS — K635 Polyp of colon: Secondary | ICD-10-CM | POA: Diagnosis not present

## 2019-03-03 DIAGNOSIS — R739 Hyperglycemia, unspecified: Secondary | ICD-10-CM | POA: Diagnosis not present

## 2019-03-03 DIAGNOSIS — D500 Iron deficiency anemia secondary to blood loss (chronic): Secondary | ICD-10-CM | POA: Diagnosis not present

## 2019-03-03 DIAGNOSIS — K55039 Acute (reversible) ischemia of large intestine, extent unspecified: Secondary | ICD-10-CM | POA: Diagnosis not present

## 2019-03-03 DIAGNOSIS — J439 Emphysema, unspecified: Secondary | ICD-10-CM | POA: Diagnosis not present

## 2019-03-03 DIAGNOSIS — K2960 Other gastritis without bleeding: Secondary | ICD-10-CM | POA: Diagnosis not present

## 2019-03-05 LAB — MYELODYSPLASTIC SYNDROME (MDS) PANEL

## 2019-03-09 ENCOUNTER — Encounter (HOSPITAL_COMMUNITY): Payer: Self-pay | Admitting: Hematology & Oncology

## 2019-03-17 DIAGNOSIS — K2960 Other gastritis without bleeding: Secondary | ICD-10-CM | POA: Diagnosis not present

## 2019-03-17 DIAGNOSIS — D500 Iron deficiency anemia secondary to blood loss (chronic): Secondary | ICD-10-CM | POA: Diagnosis not present

## 2019-03-17 DIAGNOSIS — R739 Hyperglycemia, unspecified: Secondary | ICD-10-CM | POA: Diagnosis not present

## 2019-03-17 DIAGNOSIS — K55039 Acute (reversible) ischemia of large intestine, extent unspecified: Secondary | ICD-10-CM | POA: Diagnosis not present

## 2019-03-17 DIAGNOSIS — K635 Polyp of colon: Secondary | ICD-10-CM | POA: Diagnosis not present

## 2019-03-17 DIAGNOSIS — J439 Emphysema, unspecified: Secondary | ICD-10-CM | POA: Diagnosis not present

## 2019-03-19 ENCOUNTER — Ambulatory Visit: Payer: Medicare Other | Admitting: Pulmonary Disease

## 2019-03-23 DIAGNOSIS — J439 Emphysema, unspecified: Secondary | ICD-10-CM | POA: Diagnosis not present

## 2019-03-23 DIAGNOSIS — K635 Polyp of colon: Secondary | ICD-10-CM | POA: Diagnosis not present

## 2019-03-23 DIAGNOSIS — K55039 Acute (reversible) ischemia of large intestine, extent unspecified: Secondary | ICD-10-CM | POA: Diagnosis not present

## 2019-03-23 DIAGNOSIS — R739 Hyperglycemia, unspecified: Secondary | ICD-10-CM | POA: Diagnosis not present

## 2019-03-23 DIAGNOSIS — D500 Iron deficiency anemia secondary to blood loss (chronic): Secondary | ICD-10-CM | POA: Diagnosis not present

## 2019-03-23 DIAGNOSIS — K2960 Other gastritis without bleeding: Secondary | ICD-10-CM | POA: Diagnosis not present

## 2019-03-31 ENCOUNTER — Ambulatory Visit: Payer: Medicare Other | Admitting: Internal Medicine

## 2019-05-11 ENCOUNTER — Other Ambulatory Visit (HOSPITAL_COMMUNITY)
Admission: RE | Admit: 2019-05-11 | Discharge: 2019-05-11 | Disposition: A | Discharge status: Home / Self Care | Payer: Medicare Other | Source: Ambulatory Visit | Attending: Pulmonary Disease | Admitting: Pulmonary Disease

## 2019-05-11 DIAGNOSIS — Z20822 Contact with and (suspected) exposure to covid-19: Secondary | ICD-10-CM | POA: Diagnosis not present

## 2019-05-11 DIAGNOSIS — Z01812 Encounter for preprocedural laboratory examination: Secondary | ICD-10-CM | POA: Insufficient documentation

## 2019-05-11 LAB — SARS CORONAVIRUS 2 (TAT 6-24 HRS): SARS Coronavirus 2: NEGATIVE

## 2019-05-14 ENCOUNTER — Ambulatory Visit (INDEPENDENT_AMBULATORY_CARE_PROVIDER_SITE_OTHER): Payer: Medicare Other | Admitting: Pulmonary Disease

## 2019-05-14 ENCOUNTER — Other Ambulatory Visit: Payer: Self-pay

## 2019-05-14 ENCOUNTER — Encounter: Payer: Self-pay | Admitting: Pulmonary Disease

## 2019-05-14 VITALS — BP 128/72 | HR 99 | Ht 64.0 in | Wt 109.0 lb

## 2019-05-14 DIAGNOSIS — Z7189 Other specified counseling: Secondary | ICD-10-CM | POA: Diagnosis not present

## 2019-05-14 DIAGNOSIS — I2584 Coronary atherosclerosis due to calcified coronary lesion: Secondary | ICD-10-CM

## 2019-05-14 DIAGNOSIS — Z7185 Encounter for immunization safety counseling: Secondary | ICD-10-CM

## 2019-05-14 DIAGNOSIS — J449 Chronic obstructive pulmonary disease, unspecified: Secondary | ICD-10-CM | POA: Diagnosis not present

## 2019-05-14 DIAGNOSIS — I2510 Atherosclerotic heart disease of native coronary artery without angina pectoris: Secondary | ICD-10-CM

## 2019-05-14 DIAGNOSIS — J432 Centrilobular emphysema: Secondary | ICD-10-CM | POA: Diagnosis not present

## 2019-05-14 DIAGNOSIS — Z87891 Personal history of nicotine dependence: Secondary | ICD-10-CM

## 2019-05-14 DIAGNOSIS — D509 Iron deficiency anemia, unspecified: Secondary | ICD-10-CM

## 2019-05-14 DIAGNOSIS — J439 Emphysema, unspecified: Secondary | ICD-10-CM

## 2019-05-14 LAB — PULMONARY FUNCTION TEST
DL/VA % pred: 66 %
DL/VA: 2.73 ml/min/mmHg/L
DLCO cor % pred: 50 %
DLCO cor: 9.79 ml/min/mmHg
DLCO unc % pred: 50 %
DLCO unc: 9.79 ml/min/mmHg
FEF 25-75 Post: 0.46 L/sec
FEF 25-75 Pre: 0.4 L/sec
FEF2575-%Change-Post: 14 %
FEF2575-%Pred-Post: 27 %
FEF2575-%Pred-Pre: 24 %
FEV1-%Change-Post: 4 %
FEV1-%Pred-Post: 53 %
FEV1-%Pred-Pre: 51 %
FEV1-Post: 0.97 L
FEV1-Pre: 0.93 L
FEV1FVC-%Change-Post: -2 %
FEV1FVC-%Pred-Pre: 65 %
FEV6-%Change-Post: 3 %
FEV6-%Pred-Post: 83 %
FEV6-%Pred-Pre: 81 %
FEV6-Post: 1.88 L
FEV6-Pre: 1.82 L
FEV6FVC-%Change-Post: -3 %
FEV6FVC-%Pred-Post: 99 %
FEV6FVC-%Pred-Pre: 102 %
FVC-%Change-Post: 6 %
FVC-%Pred-Post: 84 %
FVC-%Pred-Pre: 78 %
FVC-Post: 1.97 L
FVC-Pre: 1.84 L
Post FEV1/FVC ratio: 49 %
Post FEV6/FVC ratio: 96 %
Pre FEV1/FVC ratio: 51 %
Pre FEV6/FVC Ratio: 99 %
RV % pred: 204 %
RV: 4.51 L
TLC % pred: 126 %
TLC: 6.41 L

## 2019-05-14 MED ORDER — STIOLTO RESPIMAT 2.5-2.5 MCG/ACT IN AERS: 2.0000 | INHALATION_SPRAY | Freq: Every day | RESPIRATORY_TRACT | 11 refills | Status: DC

## 2019-05-14 NOTE — Patient Instructions (Addendum)
Thank you for visiting Dr. Valeta Harms at [facility]. Today we recommend the following:  Orders Placed This Encounter  Procedures  . Ambulatory referral to Cardiology   Stop [place], Samples and new prescription today  Continue albuterol as needed Continue vitamin d supplement   Return in about 6 months (around 11/11/2019) for w/ Dr. Valeta Harms .    Please do your part to reduce the spread of COVID-19.

## 2019-05-14 NOTE — Progress Notes (Signed)
Full PFT performed today. °

## 2019-05-14 NOTE — Progress Notes (Signed)
Discussed at office visit with Dr. Valeta Harms today.  Nothing further needed.Wyn Quaker, FNP

## 2019-05-14 NOTE — Progress Notes (Signed)
Synopsis: Referred in February 2021 establish care with new pulmonary provider, former patient Dr. Lake Bells PCP: Shelda Pal*  Subjective:   PATIENT ID: Andrea Ward GENDER: female DOB: 16-Oct-1945, MRN: [id number]  Chief Complaint  Patient presents with  . Follow-up    PFT performed today.  Pt states she has been doing well since last visit and denies any complaints.    This is a 74 year old female past medical history of hypoparathyroidism, gastroesophageal reflux disease anxiety and emphysema.  Patient had spirometry March 2020: Office spirometry FEV1 FVC ratio 57, FEV1 0.8 L 42% predicted.  Patient is a former smoker quit in August 2020 approximately 37-pack-year history.  OV 05/14/2019: Day following pulmonary function test.  FEV1 0.97 L, 53, no significant bronchodilator response, RV/TLC 160%, DLCO 50%.  Today we reviewed her PFTs in the office.  She is currently breathing well at baseline.  She is using Anoro Ellipta as she has a needed prior authorization for her Anoro or switching to a separate medication we discussed the various medications available today.  Plan to switch to [employer].  She is, monitor symptoms and let us know if she wants to switch back to Anoro if Stiolto will be adequate.  I suspect she will do fine with this however she may have some difficulty with the device.  We will train her on this device prior to leaving the office today.  Patient denies hemoptysis weight loss.    Past Medical History:  Diagnosis Date  . Emphysema of lung ([place])   . Generalized anxiety disorder 05/23/2016  . GERD (gastroesophageal reflux disease) 05/23/2016  . History of pregnancy induced hypertension   . Hypoparathyroidism ([place])    Acquired post surg  . Hypothyroid      Family History  Problem Relation Age of Onset  . Hypertension Mother   . Hypertension Father   . Anuerysm Sister      Past Surgical History:  Procedure Laterality Date  . THYROIDECTOMY  05/17/2016  .  TONSILLECTOMY      Social History   Socioeconomic History  . Marital status: Single    Spouse name: Not on file  . Number of children: Not on file  . Years of education: Not on file  . Highest education level: Not on file  Occupational History  . Not on file  Tobacco Use  . Smoking status: Former Smoker    Packs/day: 0.75    Years: 50.00    Pack years: 37.50    Types: Cigarettes    Start date: 03/19/1965    Quit date: 11/10/2018    Years since quitting: 0.5  . Smokeless tobacco: Never Used  . Tobacco comment: stopped smoknig august/2020!  Substance and Sexual Activity  . Alcohol use: Yes    Alcohol/week: 5.0 standard drinks    Types: 5 Glasses of wine per week  . Drug use: Not Currently  . Sexual activity: Not Currently  Other Topics Concern  . Not on file  Social History Narrative  . Not on file   Social Determinants of Health   Financial Resource Strain:   . Difficulty of Paying Living Expenses: Not on file  Food Insecurity:   . Worried About Charity fundraiser in the Last Year: Not on file  . Ran Out of Food in the Last Year: Not on file  Transportation Needs:   . Lack of Transportation (Medical): Not on file  . Lack of Transportation (Non-Medical): Not on file  Physical Activity:   .  Days of Exercise per Week: Not on file  . Minutes of Exercise per Session: Not on file  Stress:   . Feeling of Stress : Not on file  Social Connections:   . Frequency of Communication with Friends and Family: Not on file  . Frequency of Social Gatherings with Friends and Family: Not on file  . Attends Religious Services: Not on file  . Active Member of Clubs or Organizations: Not on file  . Attends Archivist Meetings: Not on file  . Marital Status: Not on file  Intimate Partner Violence:   . Fear of Current or Ex-Partner: Not on file  . Emotionally Abused: Not on file  . Physically Abused: Not on file  . Sexually Abused: Not on file     No Known Allergies    Outpatient Medications Prior to Visit  Medication Sig Dispense Refill  . albuterol (VENTOLIN HFA) 108 (90 Base) MCG/ACT inhaler INHALE 1 TO 2 PUFFS BY MOUTH EVERY 6 HOURS AS NEEDED FOR WHEEZING OR SHORTNESS OF BREATH 24 g 3  . busPIRone (BUSPAR) 15 MG tablet Take 1 tablet (15 mg total) by mouth 3 (three) times daily. 270 tablet 2  . calcitRIOL (ROCALTROL) 0.5 MCG capsule Take 1 tablet daily and 2 tablets daily on alternating days.    . Cyanocobalamin (VITAMIN B-12 PO) Take 1 tablet by mouth daily.    . famotidine (PEPCID) 20 MG tablet Take 1 tablet (20 mg total) by mouth 2 (two) times daily. 180 tablet 2  . levothyroxine (SYNTHROID) 75 MCG tablet Take 1 tablet by mouth daily.    . propranolol (INDERAL) 20 MG tablet Take 1 tablet (20 mg total) by mouth 3 (three) times daily. 270 tablet 0  . rosuvastatin (CRESTOR) 10 MG tablet Take 1 tablet (10 mg total) by mouth at bedtime. 30 tablet 11  . umeclidinium-vilanterol (ANORO ELLIPTA) 62.5-25 MCG/INH AEPB Inhale 1 puff into the lungs daily. 180 each 3   No facility-administered medications prior to visit.    Review of Systems  Constitutional: Negative for chills, fever, malaise/fatigue and weight loss.  HENT: Negative for hearing loss, sore throat and tinnitus.   Eyes: Negative for blurred vision and double vision.  Respiratory: Positive for shortness of breath. Negative for cough, hemoptysis, sputum production, wheezing and stridor.   Cardiovascular: Negative for chest pain, palpitations, orthopnea, leg swelling and PND.  Gastrointestinal: Negative for abdominal pain, constipation, diarrhea, heartburn, nausea and vomiting.  Genitourinary: Negative for dysuria, hematuria and urgency.  Musculoskeletal: Negative for joint pain and myalgias.  Skin: Negative for itching and rash.  Neurological: Negative for dizziness, tingling, weakness and headaches.  Endo/Heme/Allergies: Negative for environmental allergies. Does not bruise/bleed easily.   Psychiatric/Behavioral: Negative for depression. The patient is not nervous/anxious and does not have insomnia.   All other systems reviewed and are negative.    Objective:  Physical Exam Vitals reviewed.  Constitutional:      General: She is not in acute distress.    Appearance: She is well-developed.  HENT:     Head: Normocephalic and atraumatic.     Mouth/Throat:     Pharynx: No oropharyngeal exudate.  Eyes:     Conjunctiva/sclera: Conjunctivae normal.     Pupils: Pupils are equal, round, and reactive to light.  Neck:     Vascular: No JVD.     Trachea: No tracheal deviation.     Comments: Loss of supraclavicular fat Cardiovascular:     Rate and Rhythm: Normal rate and regular  rhythm.     Heart sounds: S1 normal and S2 normal.     Comments: Distant heart tones Pulmonary:     Effort: No tachypnea or accessory muscle usage.     Breath sounds: No stridor. Decreased breath sounds (throughout all lung fields) present. No wheezing, rhonchi or rales.  Abdominal:     General: Bowel sounds are normal. There is no distension.     Palpations: Abdomen is soft.     Tenderness: There is no abdominal tenderness.  Musculoskeletal:        General: Deformity (muscle wasting ) present.  Skin:    General: Skin is warm and dry.     Capillary Refill: Capillary refill takes less than 2 seconds.     Findings: No rash.  Neurological:     Mental Status: She is alert and oriented to person, place, and time.  Psychiatric:        Behavior: Behavior normal.      Vitals:   05/14/19 1023  BP: 128/72  Pulse: 99  SpO2: 99%  Weight: 109 lb (49.4 kg)  Height: 5' 4"  (1.626 m)   99% on RA BMI Readings from Last 3 Encounters:  05/14/19 18.71 kg/m  02/26/19 18.88 kg/m  02/02/19 19.22 kg/m   Wt Readings from Last 3 Encounters:  05/14/19 109 lb (49.4 kg)  02/26/19 110 lb (49.9 kg)  02/02/19 112 lb (50.8 kg)     CBC    Component Value Date/Time   WBC 7.2 02/26/2019 0805   RBC 3.00  (L) 02/26/2019 0805   HGB 10.8 (L) 02/26/2019 0805   HGB 9.7 (L) 02/10/2019 1341   HCT 34.7 (L) 02/26/2019 0805   PLT 318 02/26/2019 0805   PLT 396 02/10/2019 1341   MCV 115.7 (H) 02/26/2019 0805   MCH 36.0 (H) 02/26/2019 0805   MCHC 31.1 02/26/2019 0805   RDW 24.3 (H) 02/26/2019 0805   LYMPHSABS 0.5 (L) 02/26/2019 0805   MONOABS 0.3 02/26/2019 0805   EOSABS 0.2 02/26/2019 0805   BASOSABS 0.0 02/26/2019 0805    Chest Imaging: October 2020 lung cancer screening CT lung RADS 2  Pulmonary Functions Testing Results: PFT Results Latest Ref Rng & Units 05/14/2019  FVC-Pre L 1.84  FVC-Predicted Pre % 78  FVC-Post L 1.97  FVC-Predicted Post % 84  Pre FEV1/FVC % % 51  Post FEV1/FCV % % 49  FEV1-Pre L 0.93  FEV1-Predicted Pre % 51  FEV1-Post L 0.97  DLCO UNC% % 50  DLCO COR %Predicted % 66  TLC L 6.41  TLC % Predicted % 126  RV % Predicted % 204       Assessment & Plan:     ICD-10-CM   1. Stage 3 severe COPD by GOLD classification ([place])  J44.9   2. Centrilobular emphysema ([place])  J43.2   3. Coronary artery calcification of native artery  I25.10 Ambulatory referral to Cardiology   I25.84    LAD on LDCT 01/05/2019  4. Former smoker  Z87.891 Ambulatory referral to Cardiology  5. Iron deficiency anemia, unspecified iron deficiency anemia type  D50.9   6. Vaccine counseling  Z71.89     Assessment:   This is a 74 year old former smoker severe COPD.  Lung cancer screening CT with LAD calcification consistent with coronary artery disease.  Severe COPD.  Also counseled today on Covid vaccine.  Patient very leery about obtaining the Covid vaccine.  Lots of questions answered today and encouragement for obtaining next available opportunity.  Plan  Following Extensive Data Review & Interpretation:  . I reviewed prior external note(s) from 02/10/2019 oncology follow-up for iron deficiency anemia some concern of possible myelodysplasia.  02/02/2019 primary care office visit Dr.  Nani Ravens. . I reviewed the result(s) of 02/26/2019 CT-guided bone marrow biopsy.  Path results reviewed for hypercellular marrow slight plasmacytosis. . I have ordered referral to cardiology for evaluation of coronary disease.  Independent interpretation of tests . Review of patient's 01/05/2019 CT lung cancer screening images revealed lung RADS 2, severe centrilobular emphysema no pulmonary nodules.  LAD atherosclerosis.. The patient's images have been independently reviewed by me.   Pulmonary function tests 05/14/2019: Independently interpreted today in the office.  Reduced ratio, FEV1 of 0.97 L, 53% predicted consistent with obstructive lung disease, low DLCO consistent with her history of centrilobular and physical.    Current Outpatient Medications:  .  albuterol (VENTOLIN HFA) 108 (90 Base) MCG/ACT inhaler, INHALE 1 TO 2 PUFFS BY MOUTH EVERY 6 HOURS AS NEEDED FOR WHEEZING OR SHORTNESS OF BREATH, Disp: 24 g, Rfl: 3 .  busPIRone (BUSPAR) 15 MG tablet, Take 1 tablet (15 mg total) by mouth 3 (three) times daily., Disp: 270 tablet, Rfl: 2 .  calcitRIOL (ROCALTROL) 0.5 MCG capsule, Take 1 tablet daily and 2 tablets daily on alternating days., Disp: , Rfl:  .  Cyanocobalamin (VITAMIN B-12 PO), Take 1 tablet by mouth daily., Disp: , Rfl:  .  famotidine (PEPCID) 20 MG tablet, Take 1 tablet (20 mg total) by mouth 2 (two) times daily., Disp: 180 tablet, Rfl: 2 .  levothyroxine (SYNTHROID) 75 MCG tablet, Take 1 tablet by mouth daily., Disp: , Rfl:  .  propranolol (INDERAL) 20 MG tablet, Take 1 tablet (20 mg total) by mouth 3 (three) times daily., Disp: 270 tablet, Rfl: 0 .  rosuvastatin (CRESTOR) 10 MG tablet, Take 1 tablet (10 mg total) by mouth at bedtime., Disp: 30 tablet, Rfl: 11 .  umeclidinium-vilanterol (ANORO ELLIPTA) 62.5-25 MCG/INH AEPB, Inhale 1 puff into the lungs daily., Disp: 180 each, Rfl: 3   Garner Nash, DO [facility] 05/14/2019 10:46 AM

## 2019-05-14 NOTE — Progress Notes (Signed)
Patient seen in the office today and instructed on use of Stiolto.  Patient expressed understanding and demonstrated technique. 

## 2019-05-14 NOTE — Addendum Note (Signed)
Addended by: Lorretta Harp on: 05/14/2019 10:50 AM   Modules accepted: Orders

## 2019-06-08 ENCOUNTER — Ambulatory Visit: Payer: Medicare Other | Admitting: Family Medicine

## 2019-07-13 NOTE — Progress Notes (Deleted)
Subjective:   Andrea Ward is a 74 y.o. female who presents for Medicare Annual (Subsequent) preventive examination.  Review of Systems:     Home Safety/Smoke Alarms: Feels safe in home. Smoke alarms in place.  Lives alone in 1 story home. Shower chair in step over tub.    Female:       Mammo- waiting for pandemic to be over per pt.       Dexa scan-waiting for pandemic to be over per pt.         CCS- pt reported last 2011     Objective:     Vitals: There were no vitals taken for this visit.  There is no height or weight on file to calculate BMI.  Advanced Directives 02/26/2019 07/14/2018 07/09/2017 05/15/2016  Does Patient Have a Medical Advance Directive? Yes Yes Yes No  Type of Paramedic of [place];Living will [place];Living will [place];Living will -  Does patient want to make changes to medical advance directive? No - Patient declined No - Patient declined No - Patient declined -  Copy of [place] in Chart? No - copy requested No - copy requested No - copy requested -  Would patient like information on creating a medical advance directive? - - - No - Patient declined    Tobacco Social History   Tobacco Use  Smoking Status Former Smoker  . Packs/day: 0.75  . Years: 50.00  . Pack years: 37.50  . Types: Cigarettes  . Start date: 03/19/1965  . Quit date: 11/10/2018  . Years since quitting: 0.6  Smokeless Tobacco Never Used  Tobacco Comment   stopped smoknig august/2020!     Counseling given: Not Answered Comment: stopped smoknig august/2020!   Clinical Intake:                       Past Medical History:  Diagnosis Date  . Emphysema of lung ([place])   . Generalized anxiety disorder 05/23/2016  . GERD (gastroesophageal reflux disease) 05/23/2016  . History of pregnancy induced hypertension   . Hypoparathyroidism ([place])    Acquired post surg  . Hypothyroid    Past  Surgical History:  Procedure Laterality Date  . THYROIDECTOMY  05/17/2016  . TONSILLECTOMY     Family History  Problem Relation Age of Onset  . Hypertension Mother   . Hypertension Father   . Anuerysm Sister    Social History   Socioeconomic History  . Marital status: Single    Spouse name: Not on file  . Number of children: Not on file  . Years of education: Not on file  . Highest education level: Not on file  Occupational History  . Not on file  Tobacco Use  . Smoking status: Former Smoker    Packs/day: 0.75    Years: 50.00    Pack years: 37.50    Types: Cigarettes    Start date: 03/19/1965    Quit date: 11/10/2018    Years since quitting: 0.6  . Smokeless tobacco: Never Used  . Tobacco comment: stopped smoknig august/2020!  Substance and Sexual Activity  . Alcohol use: Yes    Alcohol/week: 5.0 standard drinks    Types: 5 Glasses of wine per week  . Drug use: Not Currently  . Sexual activity: Not Currently  Other Topics Concern  . Not on file  Social History Narrative  . Not on file   Social Determinants  of Health   Financial Resource Strain:   . Difficulty of Paying Living Expenses:   Food Insecurity:   . Worried About Charity fundraiser in the Last Year:   . Arboriculturist in the Last Year:   Transportation Needs:   . Film/video editor (Medical):   Marland Kitchen Lack of Transportation (Non-Medical):   Physical Activity:   . Days of Exercise per Week:   . Minutes of Exercise per Session:   Stress:   . Feeling of Stress :   Social Connections:   . Frequency of Communication with Friends and Family:   . Frequency of Social Gatherings with Friends and Family:   . Attends Religious Services:   . Active Member of Clubs or Organizations:   . Attends Archivist Meetings:   Marland Kitchen Marital Status:     Outpatient Encounter Medications as of 07/14/2019  Medication Sig  . albuterol (VENTOLIN HFA) 108 (90 Base) MCG/ACT inhaler INHALE 1 TO 2 PUFFS BY MOUTH EVERY  6 HOURS AS NEEDED FOR WHEEZING OR SHORTNESS OF BREATH  . busPIRone (BUSPAR) 15 MG tablet Take 1 tablet (15 mg total) by mouth 3 (three) times daily.  . calcitRIOL (ROCALTROL) 0.5 MCG capsule Take 1 tablet daily and 2 tablets daily on alternating days.  . Cyanocobalamin (VITAMIN B-12 PO) Take 1 tablet by mouth daily.  . famotidine (PEPCID) 20 MG tablet Take 1 tablet (20 mg total) by mouth 2 (two) times daily.  Marland Kitchen levothyroxine (SYNTHROID) 75 MCG tablet Take 1 tablet by mouth daily.  . propranolol (INDERAL) 20 MG tablet Take 1 tablet (20 mg total) by mouth 3 (three) times daily.  . rosuvastatin (CRESTOR) 10 MG tablet Take 1 tablet (10 mg total) by mouth at bedtime.  . Tiotropium Bromide-Olodaterol (STIOLTO RESPIMAT) 2.5-2.5 MCG/ACT AERS Inhale 2 puffs into the lungs daily.   No facility-administered encounter medications on file as of 07/14/2019.    Activities of Daily Living In your present state of health, do you have any difficulty performing the following activities: 02/26/2019 07/14/2018  Hearing? N N  Vision? N N  Comment - wears glasses.  Difficulty concentrating or making decisions? N N  Walking or climbing stairs? N N  Dressing or bathing? N N  Doing errands, shopping? - Scientist, forensic and eating ? - N  Using the Toilet? - N  In the past six months, have you accidently leaked urine? - N  Do you have problems with loss of bowel control? - N  Managing your Medications? - N  Managing your Finances? - N  Housekeeping or managing your Housekeeping? - N  Some recent data might be hidden    Patient Care Team: Shelda Pal, DO as PCP - General (Family Medicine)    Assessment:   This is a routine wellness examination for [place]. Physical assessment deferred to PCP.  Exercise Activities and Dietary recommendations   Diet (meal preparation, eat out, water intake, caffeinated beverages, dairy products, fruits and vegetables): {Desc; diets:16563} Breakfast: Lunch:    Dinner:      Goals    . Quit Smoking       Fall Risk Fall Risk  07/14/2018 07/09/2017 05/23/2016  Falls in the past year? 0 No No    Depression Screen PHQ 2/9 Scores 07/14/2018 07/09/2017 05/23/2016  PHQ - 2 Score 0 0 0     Cognitive Function Ad8 score reviewed for issues:  Issues making decisions:  Less interest in hobbies /  activities:  Repeats questions, stories (family complaining):  Trouble using ordinary gadgets (microwave, computer, phone):  Forgets the month or year:   Mismanaging finances:   Remembering appts:  Daily problems with thinking and/or memory: Ad8 score is=     MMSE - Mini Mental State Exam 07/09/2017  Orientation to time 5  Orientation to Place 5  Registration 3  Attention/ Calculation 5  Recall 2  Language- name 2 objects 2  Language- repeat 1  Language- follow 3 step command 3  Language- read & follow direction 1  Write a sentence 1  Copy design 1  Total score 29        Immunization History  Administered Date(s) Administered  . Moderna SARS-COVID-2 Vaccination 07/09/2019  . Pneumococcal Conjugate-13 05/23/2016  . Pneumococcal Polysaccharide-23 07/10/2017  . Tdap 05/23/2016     Screening Tests Health Maintenance  Topic Date Due  . URINE MICROALBUMIN  Never done  . COLONOSCOPY  03/20/2019  . INFLUENZA VACCINE  06/17/2026 (Originally 10/18/2019)  . MAMMOGRAM  07/16/2019  . COVID-19 Vaccine (2 - Moderna 2-dose series) 08/06/2019  . TETANUS/TDAP  05/24/2026  . DEXA SCAN  Completed  . Hepatitis C Screening  Completed  . PNA vac Low Risk Adult  Completed     Plan:   ***   I have personally reviewed and noted the following in the patient's chart:   . Medical and social history . Use of alcohol, tobacco or illicit drugs  . Current medications and supplements . Functional ability and status . Nutritional status . Physical activity . Advanced directives . List of other physicians . Hospitalizations, surgeries, and ER visits  in previous 12 months . Vitals . Screenings to include cognitive, depression, and falls . Referrals and appointments  In addition, I have reviewed and discussed with patient certain preventive protocols, quality metrics, and best practice recommendations. A written personalized care plan for preventive services as well as general preventive health recommendations were provided to patient.     Naaman Plummer [place], [place]  07/13/2019

## 2019-07-14 ENCOUNTER — Other Ambulatory Visit: Payer: Self-pay

## 2019-07-14 ENCOUNTER — Ambulatory Visit: Payer: Medicare Other | Admitting: *Deleted

## 2019-07-17 ENCOUNTER — Encounter: Payer: Self-pay | Admitting: Cardiology

## 2019-07-17 ENCOUNTER — Other Ambulatory Visit: Payer: Self-pay

## 2019-07-17 ENCOUNTER — Ambulatory Visit (INDEPENDENT_AMBULATORY_CARE_PROVIDER_SITE_OTHER): Payer: Medicare Other | Admitting: Cardiology

## 2019-07-17 VITALS — BP 132/80 | HR 112 | Temp 96.4°F | Ht 64.0 in | Wt 111.0 lb

## 2019-07-17 DIAGNOSIS — Z7189 Other specified counseling: Secondary | ICD-10-CM | POA: Diagnosis not present

## 2019-07-17 DIAGNOSIS — I2510 Atherosclerotic heart disease of native coronary artery without angina pectoris: Secondary | ICD-10-CM | POA: Diagnosis not present

## 2019-07-17 DIAGNOSIS — I6523 Occlusion and stenosis of bilateral carotid arteries: Secondary | ICD-10-CM

## 2019-07-17 NOTE — Progress Notes (Signed)
Cardiology Office Note:    Date:  07/17/2019   ID:  Andrea Ward, DOB 1945-09-22, MRN [id number]  PCP:  Shelda Pal, DO  Cardiologist:  Buford Dresser, MD  Referring MD: Garner Nash, DO   CC: new patient evaluation for coronary calcium  History of Present Illness:    Andrea Ward is a 74 y.o. female with a hx of tobacco use, anemia, COPD, hypothyroidism, hypoparathyroidism postoperatively who is seen as a new consult at the request of Icard, [place], DO for the evaluation and management of coronary calcification.  Note from 05/14/19 with Dr. Valeta Harms reviewed. Discussed calcium in her coronary artery (LAD), referred for further discusion.  Cardiovascular risk factors: Prior clinical ASCVD: no MI/CVA. Mild carotid stenosis. Comorbid conditions: hypertension with pregnancy only. Denies hyperlipidemia, diabetes, chronic kidney disease  Metabolic syndrome/Obesity: none Chronic inflammatory conditions: none Tobacco use history: "basically" quit, down to 4 cigarettes/day. Some weeks she doesn't smoke at all. Has been smoking since she was 74 years old. Has quit intermittently in the past, sometimes for years at a time. Family history: she was adopted, does not know family history well. Doesn't know parent history, but met biological siblings. Full sister died recently of an aneurysm. Half brother has hypertension in his 32s. Prior cardiac testing and/or incidental findings on other testing: coronary calcium on lung CT, as below. Exercise level: used to being active. Cleans her house, occasionally gets out to walk. Current diet: doesn't have an appetite. Makes herself eat. Doesn't cook much, eats a lot of frozen foods. Cousin cooks from his house (used to have a Chiropractor), gets that occasionally. Eats a lot of fruits and salads. Lost some weight, but gaining it back.  Has struggled with anemia, thyroid/parathyroid issues in the last year. Diagnosed with MDS, but no  plan as of yet that she knows of.  Denies chest pain. No PND, orthopnea, LE edema or unexpected weight gain. No syncope or palpitations. Feels like she has a constant band around her chest, like a tight bra. Bilateral and around her chest. No clear triggers, constant.  Breathing is stable, hasn't used rescue inhaler except rarely. Most short of breath with walking at the store, sometimes pre-treats with albuterol.  Past Medical History:  Diagnosis Date  . Emphysema of lung ([place])   . Generalized anxiety disorder 05/23/2016  . GERD (gastroesophageal reflux disease) 05/23/2016  . History of pregnancy induced hypertension   . Hypoparathyroidism ([place])    Acquired post surg  . Hypothyroid     Past Surgical History:  Procedure Laterality Date  . THYROIDECTOMY  05/17/2016  . TONSILLECTOMY      Current Medications: Current Outpatient Medications on File Prior to Visit  Medication Sig  . albuterol (VENTOLIN HFA) 108 (90 Base) MCG/ACT inhaler INHALE 1 TO 2 PUFFS BY MOUTH EVERY 6 HOURS AS NEEDED FOR WHEEZING OR SHORTNESS OF BREATH  . atorvastatin (LIPITOR) 40 MG tablet   . busPIRone (BUSPAR) 15 MG tablet Take 1 tablet (15 mg total) by mouth 3 (three) times daily.  . calcitRIOL (ROCALTROL) 0.5 MCG capsule Take 1 tablet daily and 2 tablets daily on alternating days.  . Cyanocobalamin (VITAMIN B-12 PO) Take 1 tablet by mouth daily.  . famotidine (PEPCID) 20 MG tablet Take 1 tablet (20 mg total) by mouth 2 (two) times daily.  Marland Kitchen levothyroxine (SYNTHROID) 75 MCG tablet Take by mouth.  . propranolol (INDERAL) 20 MG tablet Take 1 tablet (20 mg total) by mouth 3 (three) times daily.  Marland Kitchen  Tiotropium Bromide-Olodaterol (STIOLTO RESPIMAT) 2.5-2.5 MCG/ACT AERS Inhale 2 puffs into the lungs daily.   No current facility-administered medications on file prior to visit.     Allergies:   Patient has no known allergies.   Social History   Tobacco Use  . Smoking status: Former Smoker    Packs/day: 0.75     Years: 50.00    Pack years: 37.50    Types: Cigarettes    Start date: 03/19/1965    Quit date: 11/10/2018    Years since quitting: 0.6  . Smokeless tobacco: Never Used  . Tobacco comment: stopped smoknig august/2020!  Substance Use Topics  . Alcohol use: Yes    Alcohol/week: 5.0 standard drinks    Types: 5 Glasses of wine per week  . Drug use: Not Currently    Family History: family history includes Anuerysm in her sister; Hypertension in her father and mother.  ROS:   Please see the history of present illness.  Additional pertinent ROS: Constitutional: Negative for chills, fever, night sweats, unintentional weight loss  HENT: Negative for ear pain and hearing loss.   Eyes: Negative for loss of vision and eye pain.  Respiratory: Negative for cough, sputum, wheezing.   Cardiovascular: See HPI. Gastrointestinal: Negative for abdominal pain, melena, and hematochezia.  Genitourinary: Negative for dysuria and hematuria.  Musculoskeletal: Negative for falls and myalgias.  Skin: Negative for itching and rash.  Neurological: Negative for focal weakness, focal sensory changes and loss of consciousness.  Endo/Heme/Allergies: Does not bruise/bleed easily.     EKGs/Labs/Other Studies Reviewed:    The following studies were reviewed today: CT lung cancer screening 01/06/19 Cardiovascular: Normal heart size. No significant pericardial effusion/thickening. Left anterior descending coronary atherosclerosis. Atherosclerotic nonaneurysmal thoracic aorta. Normal caliber pulmonary arteries.  Carotid duplex 08/25/18 Summary:  Right Carotid: Velocities consistent with 1-39% stenosis in the proximal ICA and 40-59% stenosis in the distal ICA.   Left Carotid: Velocities consistent with 1-39% stenosis in the proximal ICA and 40-59% stenosis in the mid ICA.   Vertebrals: Bilateral vertebral arteries demonstrate antegrade flow. Elevated velocities at the origin of the left vertebral.   EKG:  EKG is  personally reviewed.  The ekg ordered today demonstrates sinus tachycardia at 112 bpm, LVH  Recent Labs: 02/02/2019: Magnesium 1.8; TSH 19.37 02/10/2019: ALT 5; BUN 8; Creatinine 0.78; Potassium 4.6; Sodium 143 02/26/2019: Hemoglobin 10.8; Platelets 318  Recent Lipid Panel    Component Value Date/Time   CHOL 69 12/08/2018 0954   TRIG 55.0 12/08/2018 0954   HDL 27.70 (L) 12/08/2018 0954   CHOLHDL 3 12/08/2018 0954   VLDL 11.0 12/08/2018 0954   LDLCALC 31 12/08/2018 0954    Physical Exam:    VS:  BP 132/80   Pulse (!) 112   Temp (!) 96.4 F (35.8 C)   Ht 5' 4"  (1.626 m)   Wt 111 lb (50.3 kg)   SpO2 97%   BMI 19.05 kg/m     Wt Readings from Last 3 Encounters:  07/17/19 111 lb (50.3 kg)  05/14/19 109 lb (49.4 kg)  02/26/19 110 lb (49.9 kg)    GEN: Well nourished, well developed in no acute distress HEENT: Normal, moist mucous membranes NECK: No JVD CARDIAC: regular rhythm, normal S1 and S2, no rubs or gallops. No murmurs. VASCULAR: Radial and DP pulses 2+ bilaterally. No carotid bruits RESPIRATORY:  Clear to auscultation without rales, wheezing or rhonchi  ABDOMEN: Soft, non-tender, non-distended MUSCULOSKELETAL:  Ambulates independently SKIN: Warm and dry, no edema  NEUROLOGIC:  Alert and oriented x 3. No focal neuro deficits noted. PSYCHIATRIC:  Normal affect    ASSESSMENT:    1. Coronary artery calcification seen on CT scan   2. Cardiac risk counseling   3. Counseling on health promotion and disease prevention   4. Bilateral carotid artery stenosis    PLAN:    Coronary calcification on CT scan: We reviewed the calcium score at length, including actual images as well as the graph showing mortality based on calcium score. We discussed the pathophysiology of cholesterol plaque formation, the role of calcium and why it is a marker, how plaque is key to acute MI/CVA, and how known plaque is managed with medications.  -discussed key to progression is risk factor  management -is already on rosuvastatin 10 mg daily, continue -last LDL 31, excellent control -counseled on red flag warning signs that need immediate medical attention  ASCVD: bilateral nonobstructive carotid stenosis -risk factor management -asymptomatic  Cardiac risk counseling and prevention recommendations: -recommend heart healthy/Mediterranean diet, with whole grains, fruits, vegetable, fish, lean meats, nuts, and olive oil. Limit salt. -recommend moderate walking, 3-5 times/week for 30-50 minutes each session. Aim for at least 150 minutes.week. Goal should be pace of 3 miles/hours, or walking 1.5 miles in 30 minutes -recommend avoidance of tobacco products. Avoid excess alcohol. -Additional risk factor control:  -Diabetes risk: A1c is not available, denies history  -Lipids: From 12/08/18, excellent control  -Blood pressure control: denies recent hypertension (not since pregnancy)  -Weight: BMI 19 -ASCVD risk score: The ASCVD Risk score Mikey Bussing DC Jr., et al., 2013) failed to calculate for the following reasons:   The valid total cholesterol range is 130 to 320 mg/dL    Plan for follow up: 1 year or sooner as needed  Buford Dresser, MD, PhD [place]  [facility]    Medication Adjustments/Labs and Tests Ordered: Current medicines are reviewed at length with the patient today.  Concerns regarding medicines are outlined above.  No orders of the defined types were placed in this encounter.  No orders of the defined types were placed in this encounter.   Patient Instructions  Medication Instructions:  Continue current medications  *If you need a refill on your cardiac medications before your next appointment, please call your pharmacy*   Lab Work: None Ordered  Testing/Procedures: None Ordered   Follow-Up: At [facility], you and your health needs are our priority.  As part of our continuing mission to provide you with exceptional heart care, we have  created designated Provider Care Teams.  These Care Teams include your primary Cardiologist (physician) and Advanced Practice Providers (APPs -  Physician Assistants and Nurse Practitioners) who all work together to provide you with the care you need, when you need it.  We recommend signing up for the patient portal called "MyChart".  Sign up information is provided on this After Visit Summary.  MyChart is used to connect with patients for Virtual Visits (Telemedicine).  Patients are able to view lab/test results, encounter notes, upcoming appointments, etc.  Non-urgent messages can be sent to your provider as well.   To learn more about what you can do with MyChart, go to [email or web address].    Your next appointment:   1 year(s)  The format for your next appointment:   In Person  Provider:   You may see Buford Dresser, MD or one of the following Advanced Practice Providers on your designated Care Team:    Rosaria Ferries, PA-C  Jory Sims, DNP, ANP  Tarri Glenn, NP      Signed, Buford Dresser, MD PhD 07/17/2019    [place]

## 2019-07-17 NOTE — Progress Notes (Signed)
This visit is being conducted via phone call  - after an attmept to do on video chat - due to the COVID-19 pandemic. This patient has given me verbal consent via phone to conduct this visit, patient states they are participating from their home address. Some vital signs may be absent or patient reported.   Patient identification: identified by name, DOB, and current address.    Subjective:   Andrea Ward is a 74 y.o. female who presents for Medicare Annual (Subsequent) preventive examination.  Review of Systems:   Home Safety/Smoke Alarms: Feels safe in home. Smoke alarms in place.  Lives alone in 1 story home.   Female:      Mammo- pt states she will discuss w/ PCP at next visit       Dexa scan-  pt states she will discuss w/ PCP at next visit            CCS- pt last reported 2011    Objective:     Vitals: Unable to assess. This visit is enabled though telemedicine due to Covid 19.   Advanced Directives 07/20/2019 02/26/2019 07/14/2018 07/09/2017 05/15/2016  Does Patient Have a Medical Advance Directive? Yes Yes Yes Yes No  Type of Paramedic of [place];Living will [place];Living will [place];Living will [place];Living will -  Does patient want to make changes to medical advance directive? No - Patient declined No - Patient declined No - Patient declined No - Patient declined -  Copy of [place] in Chart? No - copy requested No - copy requested No - copy requested No - copy requested -  Would patient like information on creating a medical advance directive? - - - - No - Patient declined    Tobacco Social History   Tobacco Use  Smoking Status Former Smoker  . Packs/day: 0.75  . Years: 50.00  . Pack years: 37.50  . Types: Cigarettes  . Start date: 03/19/1965  . Quit date: 11/10/2018  . Years since quitting: 0.6  Smokeless Tobacco Never Used  Tobacco Comment   stopped  smoknig august/2020!     Counseling given: Not Answered Comment: stopped smoknig august/2020!   Clinical Intake:     Pain : No/denies pain                 Past Medical History:  Diagnosis Date  . Emphysema of lung ([place])   . Generalized anxiety disorder 05/23/2016  . GERD (gastroesophageal reflux disease) 05/23/2016  . History of pregnancy induced hypertension   . Hypoparathyroidism ([place])    Acquired post surg  . Hypothyroid    Past Surgical History:  Procedure Laterality Date  . THYROIDECTOMY  05/17/2016  . TONSILLECTOMY     Family History  Problem Relation Age of Onset  . Hypertension Mother   . Hypertension Father   . Anuerysm Sister    Social History   Socioeconomic History  . Marital status: Single    Spouse name: Not on file  . Number of children: Not on file  . Years of education: Not on file  . Highest education level: Not on file  Occupational History  . Not on file  Tobacco Use  . Smoking status: Former Smoker    Packs/day: 0.75    Years: 50.00    Pack years: 37.50    Types: Cigarettes    Start date: 03/19/1965    Quit date: 11/10/2018  Years since quitting: 0.6  . Smokeless tobacco: Never Used  . Tobacco comment: stopped smoknig august/2020!  Substance and Sexual Activity  . Alcohol use: Yes    Alcohol/week: 5.0 standard drinks    Types: 5 Glasses of wine per week  . Drug use: Not Currently  . Sexual activity: Not Currently  Other Topics Concern  . Not on file  Social History Narrative  . Not on file   Social Determinants of Health   Financial Resource Strain:   . Difficulty of Paying Living Expenses:   Food Insecurity:   . Worried About Charity fundraiser in the Last Year:   . Arboriculturist in the Last Year:   Transportation Needs:   . Film/video editor (Medical):   Marland Kitchen Lack of Transportation (Non-Medical):   Physical Activity:   . Days of Exercise per Week:   . Minutes of Exercise per Session:   Stress:   . Feeling  of Stress :   Social Connections:   . Frequency of Communication with Friends and Family:   . Frequency of Social Gatherings with Friends and Family:   . Attends Religious Services:   . Active Member of Clubs or Organizations:   . Attends Archivist Meetings:   Marland Kitchen Marital Status:     Outpatient Encounter Medications as of 07/20/2019  Medication Sig  . albuterol (VENTOLIN HFA) 108 (90 Base) MCG/ACT inhaler INHALE 1 TO 2 PUFFS BY MOUTH EVERY 6 HOURS AS NEEDED FOR WHEEZING OR SHORTNESS OF BREATH  . atorvastatin (LIPITOR) 40 MG tablet   . busPIRone (BUSPAR) 15 MG tablet Take 1 tablet (15 mg total) by mouth 3 (three) times daily.  . calcitRIOL (ROCALTROL) 0.5 MCG capsule Take 1 tablet daily and 2 tablets daily on alternating days.  . Cyanocobalamin (VITAMIN B-12 PO) Take 1 tablet by mouth daily.  Marland Kitchen levothyroxine (SYNTHROID) 75 MCG tablet Take by mouth.  . propranolol (INDERAL) 20 MG tablet Take 1 tablet (20 mg total) by mouth 3 (three) times daily.  . Tiotropium Bromide-Olodaterol (STIOLTO RESPIMAT) 2.5-2.5 MCG/ACT AERS Inhale 2 puffs into the lungs daily.  . famotidine (PEPCID) 20 MG tablet Take 1 tablet (20 mg total) by mouth 2 (two) times daily. (Patient not taking: Reported on 07/20/2019)  . [DISCONTINUED] levothyroxine (SYNTHROID) 75 MCG tablet Take 1 tablet by mouth daily.  . [DISCONTINUED] rosuvastatin (CRESTOR) 10 MG tablet Take 1 tablet (10 mg total) by mouth at bedtime.   No facility-administered encounter medications on file as of 07/20/2019.    Activities of Daily Living In your present state of health, do you have any difficulty performing the following activities: 07/20/2019 02/26/2019  Hearing? N N  Vision? N N  Difficulty concentrating or making decisions? N N  Walking or climbing stairs? N N  Dressing or bathing? N N  Doing errands, shopping? N -  Preparing Food and eating ? N -  Using the Toilet? N -  In the past six months, have you accidently leaked urine? N -    Do you have problems with loss of bowel control? N -  Managing your Medications? N -  Managing your Finances? N -  Housekeeping or managing your Housekeeping? N -  Some recent data might be hidden    Patient Care Team: Shelda Pal, DO as PCP - General (Family Medicine) Buford Dresser, MD as PCP - Cardiology (Cardiology) Juanito Doom, MD as Consulting Physician (Pulmonary Disease)    Assessment:  This is a routine wellness examination for [place]. Physical assessment deferred to PCP.'  Exercise Activities and Dietary recommendations Current Exercise Habits: Home exercise routine, Type of exercise: walking, Time (Minutes): 10, Exercise limited by: respiratory conditions(s) Diet (meal preparation, eat out, water intake, caffeinated beverages, dairy products, fruits and vegetables): well balanced   Goals    . Quit Smoking       Fall Risk Fall Risk  07/20/2019 07/14/2018 07/09/2017 05/23/2016  Falls in the past year? 0 0 No No  Number falls in past yr: 0 - - -  Injury with Fall? 0 - - -  Follow up Education provided;Falls prevention discussed - - -   Depression Screen PHQ 2/9 Scores 07/20/2019 07/14/2018 07/09/2017 05/23/2016  PHQ - 2 Score 0 0 0 0     Cognitive Function Ad8 score reviewed for issues:  Issues making decisions:no  Less interest in hobbies / activities: no  Repeats questions, stories (family complaining):no  Trouble using ordinary gadgets (microwave, computer, phone):no  Forgets the month or year: no  Mismanaging finances: no  Remembering appts:no  Daily problems with thinking and/or memory:no Ad8 score is=0     MMSE - Mini Mental State Exam 07/09/2017  Orientation to time 5  Orientation to Place 5  Registration 3  Attention/ Calculation 5  Recall 2  Language- name 2 objects 2  Language- repeat 1  Language- follow 3 step command 3  Language- read & follow direction 1  Write a sentence 1  Copy design 1  Total score 29         Immunization History  Administered Date(s) Administered  . Moderna SARS-COVID-2 Vaccination 07/09/2019  . Pneumococcal Conjugate-13 05/23/2016  . Pneumococcal Polysaccharide-23 07/10/2017  . Tdap 05/23/2016   Screening Tests Health Maintenance  Topic Date Due  . URINE MICROALBUMIN  Never done  . COLONOSCOPY  03/20/2019  . MAMMOGRAM  07/16/2019  . INFLUENZA VACCINE  06/17/2026 (Originally 10/18/2019)  . COVID-19 Vaccine (2 - Moderna 2-dose series) 08/06/2019  . TETANUS/TDAP  05/24/2026  . DEXA SCAN  Completed  . Hepatitis C Screening  Completed  . PNA vac Low Risk Adult  Completed     Plan:    Please schedule your next medicare wellness visit with me in 1 yr.  Continue to eat heart healthy diet (full of fruits, vegetables, whole grains, lean protein, water--limit salt, fat, and sugar intake) and increase physical activity as tolerated.  Continue doing brain stimulating activities (puzzles, reading, adult coloring books, staying active) to keep memory sharp.   Bring a copy of your living will and/or healthcare power of attorney to your next office visit.   I have personally reviewed and noted the following in the patient's chart:   . Medical and social history . Use of alcohol, tobacco or illicit drugs  . Current medications and supplements . Functional ability and status . Nutritional status . Physical activity . Advanced directives . List of other physicians . Hospitalizations, surgeries, and ER visits in previous 12 months . Vitals . Screenings to include cognitive, depression, and falls . Referrals and appointments  In addition, I have reviewed and discussed with patient certain preventive protocols, quality metrics, and best practice recommendations. A written personalized care plan for preventive services as well as general preventive health recommendations were provided to patient.     Shela Nevin, [place]  07/20/2019

## 2019-07-17 NOTE — Patient Instructions (Signed)
Medication Instructions:  Continue current medications  *If you need a refill on your cardiac medications before your next appointment, please call your pharmacy*   Lab Work: None Ordered  Testing/Procedures: None Ordered   Follow-Up: At [facility], you and your health needs are our priority.  As part of our continuing mission to provide you with exceptional heart care, we have created designated Provider Care Teams.  These Care Teams include your primary Cardiologist (physician) and Advanced Practice Providers (APPs -  Physician Assistants and Nurse Practitioners) who all work together to provide you with the care you need, when you need it.  We recommend signing up for the patient portal called "MyChart".  Sign up information is provided on this After Visit Summary.  MyChart is used to connect with patients for Virtual Visits (Telemedicine).  Patients are able to view lab/test results, encounter notes, upcoming appointments, etc.  Non-urgent messages can be sent to your provider as well.   To learn more about what you can do with MyChart, go to [email or web address].    Your next appointment:   1 year(s)  The format for your next appointment:   In Person  Provider:   You may see Buford Dresser, MD or one of the following Advanced Practice Providers on your designated Care Team:    Rosaria Ferries, PA-C  Jory Sims, DNP, ANP  Cadence Kathlen Mody, NP

## 2019-07-20 ENCOUNTER — Ambulatory Visit (INDEPENDENT_AMBULATORY_CARE_PROVIDER_SITE_OTHER): Payer: Medicare Other | Admitting: *Deleted

## 2019-07-20 ENCOUNTER — Encounter: Payer: Self-pay | Admitting: *Deleted

## 2019-07-20 ENCOUNTER — Other Ambulatory Visit: Payer: Self-pay

## 2019-07-20 DIAGNOSIS — Z0000 Encounter for general adult medical examination without abnormal findings: Secondary | ICD-10-CM | POA: Diagnosis not present

## 2019-07-20 NOTE — Patient Instructions (Signed)
Please schedule your next medicare wellness visit with me in 1 yr.  Continue to eat heart healthy diet (full of fruits, vegetables, whole grains, lean protein, water--limit salt, fat, and sugar intake) and increase physical activity as tolerated.  Continue doing brain stimulating activities (puzzles, reading, adult coloring books, staying active) to keep memory sharp.   Bring a copy of your living will and/or healthcare power of attorney to your next office visit.   Andrea Ward , Thank you for taking time to come for your Medicare Wellness Visit. I appreciate your ongoing commitment to your health goals. Please review the following plan we discussed and let me know if I can assist you in the future.   These are the goals we discussed: Goals    . Quit Smoking       This is a list of the screening recommended for you and due dates:  Health Maintenance  Topic Date Due  . Urine Protein Check  Never done  . Colon Cancer Screening  03/20/2019  . Mammogram  07/16/2019  . Flu Shot  06/17/2026*  . COVID-19 Vaccine (2 - Moderna 2-dose series) 08/06/2019  . Tetanus Vaccine  05/24/2026  . DEXA scan (bone density measurement)  Completed  .  Hepatitis C: One time screening is recommended by Center for Disease Control  (CDC) for  adults born from [birth year] through 1963.   Completed  . Pneumonia vaccines  Completed  *Topic was postponed. The date shown is not the original due date.    Preventive Care 72 Years and Older, Female Preventive care refers to lifestyle choices and visits with your health care provider that can promote health and wellness. This includes:  A yearly physical exam. This is also called an annual well check.  Regular dental and eye exams.  Immunizations.  Screening for certain conditions.  Healthy lifestyle choices, such as diet and exercise. What can I expect for my preventive care visit? Physical exam Your health care provider will check:  Height and weight. These  may be used to calculate body mass index (BMI), which is a measurement that tells if you are at a healthy weight.  Heart rate and blood pressure.  Your skin for abnormal spots. Counseling Your health care provider may ask you questions about:  Alcohol, tobacco, and drug use.  Emotional well-being.  Home and relationship well-being.  Sexual activity.  Eating habits.  History of falls.  Memory and ability to understand (cognition).  Work and work Statistician.  Pregnancy and menstrual history. What immunizations do I need?  Influenza (flu) vaccine  This is recommended every year. Tetanus, diphtheria, and pertussis (Tdap) vaccine  You may need a Td booster every 10 years. Varicella (chickenpox) vaccine  You may need this vaccine if you have not already been vaccinated. Zoster (shingles) vaccine  You may need this after age 72. Pneumococcal conjugate (PCV13) vaccine  One dose is recommended after age 72. Pneumococcal polysaccharide (PPSV23) vaccine  One dose is recommended after age 72. Measles, mumps, and rubella (MMR) vaccine  You may need at least one dose of MMR if you were born in 1957 or later. You may also need a second dose. Meningococcal conjugate (MenACWY) vaccine  You may need this if you have certain conditions. Hepatitis A vaccine  You may need this if you have certain conditions or if you travel or work in places where you may be exposed to hepatitis A. Hepatitis B vaccine  You may need this if you have  certain conditions or if you travel or work in places where you may be exposed to hepatitis B. Haemophilus influenzae type b (Hib) vaccine  You may need this if you have certain conditions. You may receive vaccines as individual doses or as more than one vaccine together in one shot (combination vaccines). Talk with your health care provider about the risks and benefits of combination vaccines. What tests do I need? Blood tests  Lipid and  cholesterol levels. These may be checked every 5 years, or more frequently depending on your overall health.  Hepatitis C test.  Hepatitis B test. Screening  Lung cancer screening. You may have this screening every year starting at age 72 if you have a 30-pack-year history of smoking and currently smoke or have quit within the past 15 years.  Colorectal cancer screening. All adults should have this screening starting at age 72 and continuing until age 72. Your health care provider may recommend screening at age 72 if you are at increased risk. You will have tests every 1-10 years, depending on your results and the type of screening test.  Diabetes screening. This is done by checking your blood sugar (glucose) after you have not eaten for a while (fasting). You may have this done every 1-3 years.  Mammogram. This may be done every 1-2 years. Talk with your health care provider about how often you should have regular mammograms.  BRCA-related cancer screening. This may be done if you have a family history of breast, ovarian, tubal, or peritoneal cancers. Other tests  Sexually transmitted disease (STD) testing.  Bone density scan. This is done to screen for osteoporosis. You may have this done starting at age 53. Follow these instructions at home: Eating and drinking  Eat a diet that includes fresh fruits and vegetables, whole grains, lean protein, and low-fat dairy products. Limit your intake of foods with high amounts of sugar, saturated fats, and salt.  Take vitamin and mineral supplements as recommended by your health care provider.  Do not drink alcohol if your health care provider tells you not to drink.  If you drink alcohol: ? Limit how much you have to 0-1 drink a day. ? Be aware of how much alcohol is in your drink. In the [place] one drink equals one 12 oz bottle of beer (355 mL), one 5 oz glass of wine (148 mL), or one 1 oz glass of hard liquor (44 mL). Lifestyle  Take  daily care of your teeth and gums.  Stay active. Exercise for at least 30 minutes on 5 or more days each week.  Do not use any products that contain nicotine or tobacco, such as cigarettes, e-cigarettes, and chewing tobacco. If you need help quitting, ask your health care provider.  If you are sexually active, practice safe sex. Use a condom or other form of protection in order to prevent STIs (sexually transmitted infections).  Talk with your health care provider about taking a low-dose aspirin or statin. What's next?  Go to your health care provider once a year for a well check visit.  Ask your health care provider how often you should have your eyes and teeth checked.  Stay up to date on all vaccines. This information is not intended to replace advice given to you by your health care provider. Make sure you discuss any questions you have with your health care provider. Document Revised: 02/27/2018 Document Reviewed: 02/27/2018 Elsevier Patient Education  2020 [employer].

## 2019-08-06 DIAGNOSIS — Z23 Encounter for immunization: Secondary | ICD-10-CM | POA: Diagnosis not present

## 2019-08-27 ENCOUNTER — Encounter: Payer: Self-pay | Admitting: Cardiology

## 2019-08-27 DIAGNOSIS — I2510 Atherosclerotic heart disease of native coronary artery without angina pectoris: Secondary | ICD-10-CM | POA: Insufficient documentation

## 2019-08-27 DIAGNOSIS — I6523 Occlusion and stenosis of bilateral carotid arteries: Secondary | ICD-10-CM | POA: Insufficient documentation

## 2019-10-14 DIAGNOSIS — H43812 Vitreous degeneration, left eye: Secondary | ICD-10-CM | POA: Diagnosis not present

## 2019-10-14 DIAGNOSIS — H35372 Puckering of macula, left eye: Secondary | ICD-10-CM | POA: Diagnosis not present

## 2019-10-14 DIAGNOSIS — H35342 Macular cyst, hole, or pseudohole, left eye: Secondary | ICD-10-CM | POA: Diagnosis not present

## 2019-10-14 DIAGNOSIS — H2513 Age-related nuclear cataract, bilateral: Secondary | ICD-10-CM | POA: Diagnosis not present

## 2019-10-14 DIAGNOSIS — H43821 Vitreomacular adhesion, right eye: Secondary | ICD-10-CM | POA: Diagnosis not present

## 2019-10-17 IMAGING — CT CT CHEST LUNG CANCER SCREENING LOW DOSE W/O CM
1 of 3 series · 10 of 30 positions shown, 13 images · non-contrast
Comparison: No priors.

CLINICAL DATA: 72-year-old female current smoker with 52 pack-year
history of smoking. Lung cancer screening examination.

EXAM:
CT CHEST WITHOUT CONTRAST LOW-DOSE FOR LUNG CANCER SCREENING
TECHNIQUE: Multidetector CT imaging of the chest was performed following the
standard protocol without IV contrast.

[ct lung segmentation data · axial · 0.54mm/px · z∈[-387,-387]mm · 10 of 309 frames shown]
[frame 1/309  mediastinal]
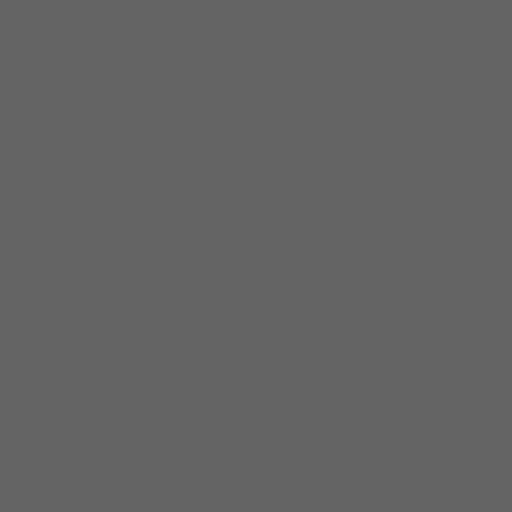
[frame 1/309  lung]
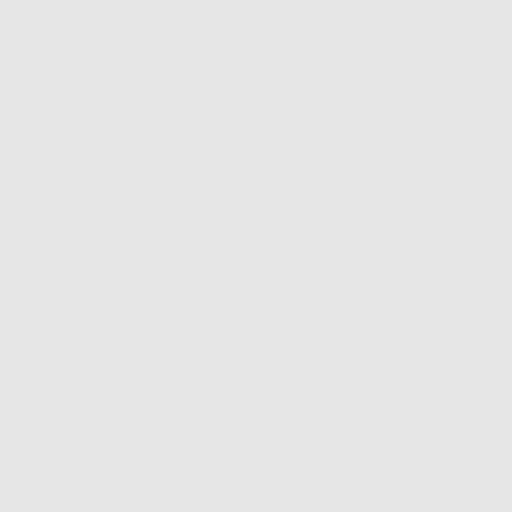
[frame 35/309  lung]
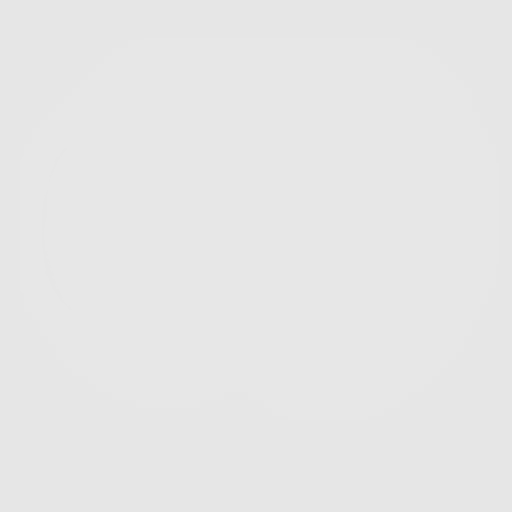
[frame 69/309  lung]
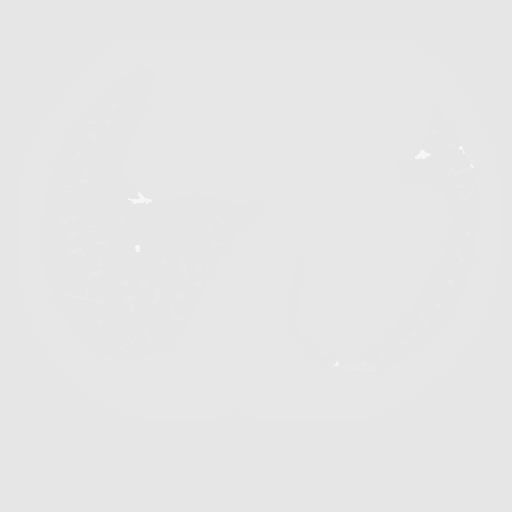
[frame 103/309  lung]
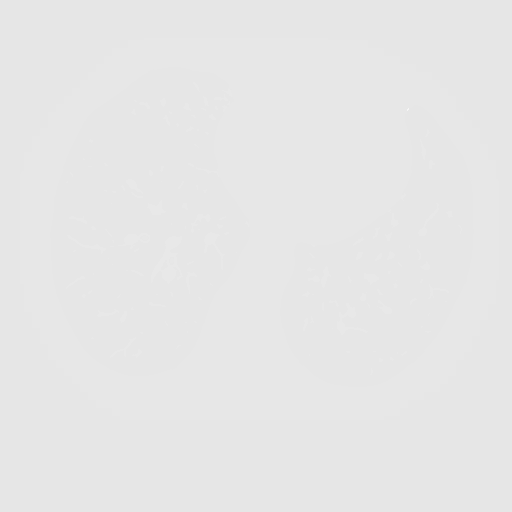
[frame 137/309  mediastinal]
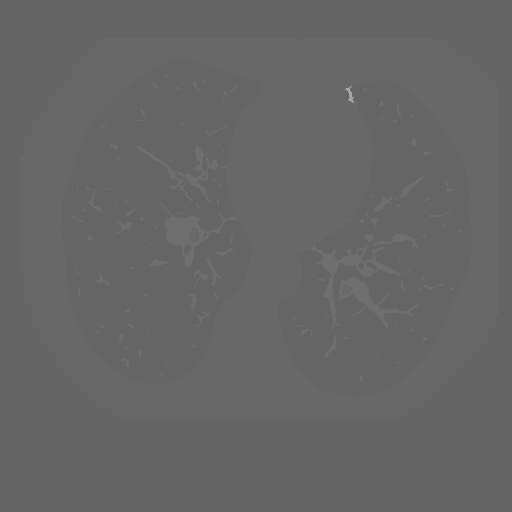
[frame 137/309  lung]
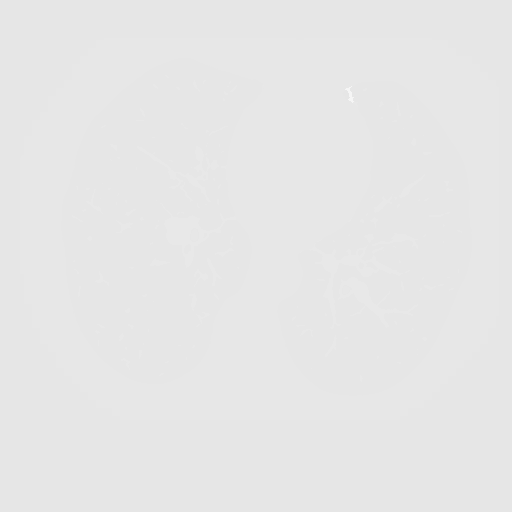
[frame 172/309  lung]
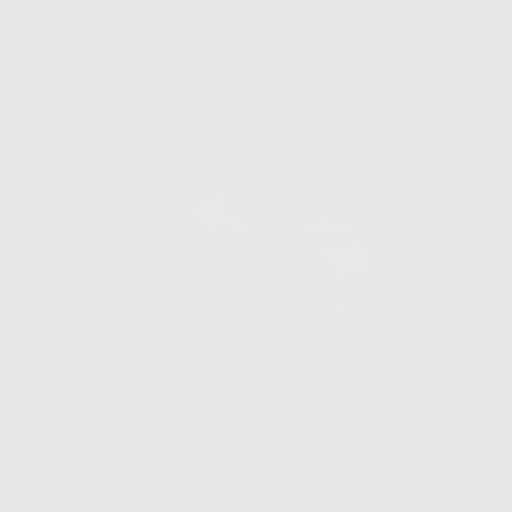
[frame 206/309  lung]
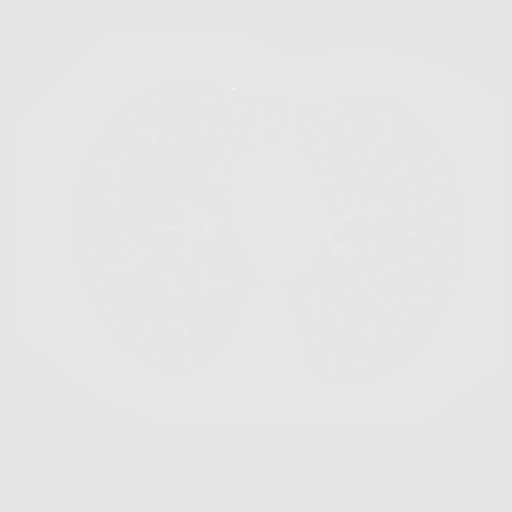
[frame 240/309  lung]
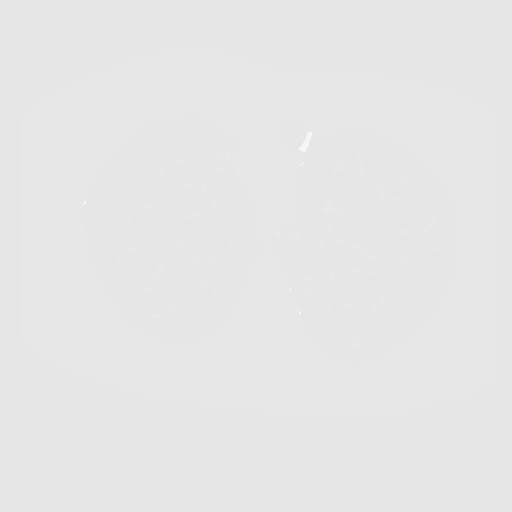
[frame 274/309  mediastinal]
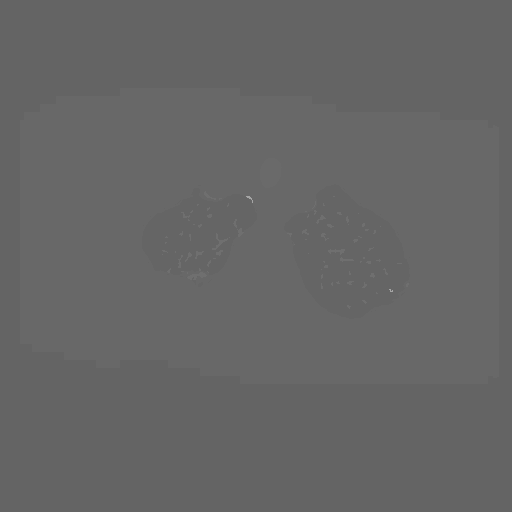
[frame 274/309  lung]
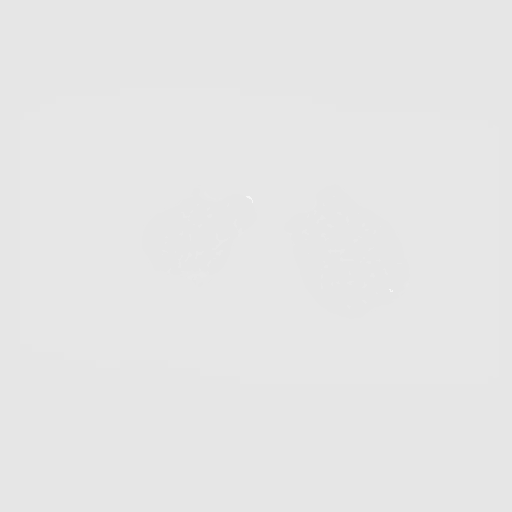
[frame 309/309  lung]
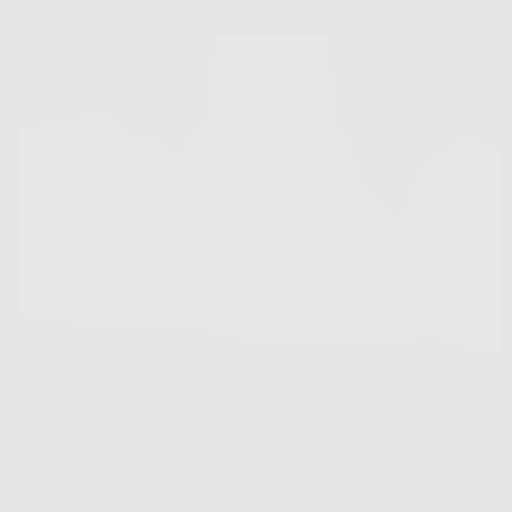

[10 of 30 positions shown; findings below may reference images not displayed]

FINDINGS: Cardiovascular: Heart size is normal. There is no significant
pericardial fluid, thickening or pericardial calcification. There is
aortic atherosclerosis, as well as atherosclerosis of the great
vessels of the mediastinum and the coronary arteries, including
calcified atherosclerotic plaque in the left main, left anterior
descending and right coronary arteries. Calcifications of the aortic
valve.

Mediastinum/Nodes: No pathologically enlarged mediastinal or hilar
lymph nodes. Please note that accurate exclusion of hilar adenopathy
is limited on noncontrast CT scans. Esophagus is unremarkable in
appearance. No axillary lymphadenopathy.

Lungs/Pleura: Multiple small pulmonary nodules are noted throughout
both lungs, largest of which is in the medial aspect of the right
lower lobe (axial image 221 of series 3) with a volume derived mean
diameter 5.2 mm. No larger more suspicious appearing pulmonary
nodules or masses are noted. Diffuse bronchial wall thickening with
severe centrilobular and paraseptal emphysema. No consolidative
airspace disease. No pleural effusions.

Upper Abdomen: [DATE] x 1.6 cm low-attenuation lesion in the left lobe
of the liver, incompletely characterized on today's noncontrast CT
examination. Other ill-defined low-attenuation areas adjacent to the
falciform ligament in the liver predominantly in segments 4A and 4B,
unusual in appearance and not characterized. Aortic atherosclerosis.

Musculoskeletal: There are no aggressive appearing lytic or blastic
lesions noted in the visualized portions of the skeleton.
IMPRESSION: 1. Lung-RADS 2S, benign appearance or behavior. Continue annual
screening with low-dose chest CT without contrast in 12 months.
2. The "S" modifier above refers to potentially clinically
significant non lung cancer related findings. Specifically, there is
aortic atherosclerosis, in addition to left main and 2 vessel
coronary artery disease. Please note that although the presence of
coronary artery calcium documents the presence of coronary artery
disease, the severity of this disease and any potential stenosis
cannot be assessed on this non-gated CT examination. Assessment for
potential risk factor modification, dietary therapy or pharmacologic
therapy may be warranted, if clinically indicated.
3. Diffuse bronchial wall thickening with severe centrilobular and
paraseptal emphysema; imaging findings suggestive of underlying
COPD.
4. Highly unusual appearance of the liver with unusual
low-attenuation regions adjacent to the falciform ligament. This may
simply reflect focal fatty infiltration, however, further
characterization with nonemergent MRI of the abdomen with and
without IV gadolinium is strongly recommended in the near future to
definitively characterize these findings.

Aortic Atherosclerosis (1Y532-JGV.V) and Emphysema (1Y532-NDN.P).

## 2019-10-23 ENCOUNTER — Other Ambulatory Visit: Payer: Self-pay | Admitting: Family Medicine

## 2019-10-23 DIAGNOSIS — J439 Emphysema, unspecified: Secondary | ICD-10-CM

## 2019-11-06 ENCOUNTER — Other Ambulatory Visit: Payer: Self-pay | Admitting: Family Medicine

## 2019-11-06 DIAGNOSIS — F411 Generalized anxiety disorder: Secondary | ICD-10-CM

## 2019-12-23 ENCOUNTER — Other Ambulatory Visit: Payer: Self-pay

## 2019-12-23 ENCOUNTER — Encounter: Payer: Self-pay | Admitting: Family Medicine

## 2019-12-23 ENCOUNTER — Ambulatory Visit (INDEPENDENT_AMBULATORY_CARE_PROVIDER_SITE_OTHER): Payer: Medicare Other | Admitting: Family Medicine

## 2019-12-23 VITALS — BP 128/86 | HR 100 | Temp 98.0°F | Ht 64.0 in | Wt 111.2 lb

## 2019-12-23 DIAGNOSIS — Z1211 Encounter for screening for malignant neoplasm of colon: Secondary | ICD-10-CM

## 2019-12-23 DIAGNOSIS — F411 Generalized anxiety disorder: Secondary | ICD-10-CM | POA: Diagnosis not present

## 2019-12-23 DIAGNOSIS — Z1231 Encounter for screening mammogram for malignant neoplasm of breast: Secondary | ICD-10-CM

## 2019-12-23 DIAGNOSIS — R251 Tremor, unspecified: Secondary | ICD-10-CM

## 2019-12-23 DIAGNOSIS — R2681 Unsteadiness on feet: Secondary | ICD-10-CM

## 2019-12-23 DIAGNOSIS — E890 Postprocedural hypothyroidism: Secondary | ICD-10-CM | POA: Diagnosis not present

## 2019-12-23 LAB — T4, FREE: Free T4: 1.5 ng/dL (ref 0.8–1.8)

## 2019-12-23 LAB — TSH: TSH: 0.18 mIU/L — ABNORMAL LOW (ref 0.40–4.50)

## 2019-12-23 MED ORDER — BUSPIRONE HCL 15 MG PO TABS: 15.0000 mg | ORAL_TABLET | Freq: Three times a day (TID) | ORAL | 2 refills | Status: DC

## 2019-12-23 MED ORDER — PROPRANOLOL HCL 20 MG PO TABS: 20.0000 mg | ORAL_TABLET | Freq: Three times a day (TID) | ORAL | 2 refills | Status: DC

## 2019-12-23 NOTE — Progress Notes (Signed)
Chief Complaint  Patient presents with  . Follow-up    Subjective Andrea Ward presents for f/u anxiety/depression.  Pt is currently being treated with BuSpar 15 mg TID.  Reports doing well since treatment. No thoughts of harming self or others. No self-medication with alcohol, prescription drugs or illicit drugs. Pt is not following with a counselor/psychologist.  Hypothyroidism Patient presents for follow-up of hypothyroidism.  Reports compliance with medication- 75 mcg/d. Current symptoms include: denies fatigue, weight changes, heat/cold intolerance, bowel/skin changes or CVS symptoms  Pt has been having weakness intermittently. Usually in the AM. No balance issues. She is not having back or neck pain. It affects her entire body. No other neurologic s/s's.   Past Medical History:  Diagnosis Date  . Emphysema of lung ([place])   . Generalized anxiety disorder 05/23/2016  . GERD (gastroesophageal reflux disease) 05/23/2016  . History of pregnancy induced hypertension   . 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 Hypothyroid    Exam BP 128/86 (BP Location: Left Arm, Patient Position: Sitting, Cuff Size: Normal)   Pulse 100   Temp 98 F (36.7 C) (Oral)   Ht 5\' 4"  (1.626 m)   Wt 111 lb 4 oz (50.5 kg)   SpO2 95%   BMI 19.10 kg/m  General:  well developed, well nourished, in no apparent distress Neuro: DTR's equal and symmetric throughout, no clonus, gait is slow, +intentional tremor Lungs:  No respiratory distress, CTAB Heart: RRR Psych: well oriented with normal range of affect and age-appropriate judgement/insight, alert and oriented x4.  Assessment and Plan  Generalized anxiety disorder - Plan: busPIRone (BUSPAR) 15 MG tablet  Tremor - Plan: propranolol (INDERAL) 20 MG tablet  Postoperative hypothyroidism - Plan: TSH, T4, free  Unsteady gait  1. Cont BuSpar 15 mg TID. [facility] info given. 2. Restasrt Inderal 20 mg TID. 3. Ck labs. Will cont Synthroid 75  mcg/d.  4. If normal labs, will set up home PT.  F/u in 6 mo pending above. The patient voiced understanding and agreement to the plan.  [place], DO 12/23/19 9:10 AM

## 2019-12-23 NOTE — Patient Instructions (Addendum)
Give [place] 2-3 business days to get the results of your labs back. If labs are normal, we will set you up with home physical therapy. We will also send in a refill of your thyroid medication.   Aim to do some physical exertion for 150 minutes per week. This is typically divided into 5 days per week, 30 minutes per day. The activity should be enough to get your heart rate up. Anything is better than nothing if you have time constraints.  Please consider counseling. Contact [phone] to schedule an appointment or inquire about cost/insurance coverage.  Coping skills Choose 5 that work for you:  Take a deep breath  Count to 20  Read a book  Do a puzzle  Meditate  Bake  Sing  Knit  Garden  Pray  Go outside  Call a friend  Listen to music  Take a walk  Color  Send a note  Take a bath  Watch a movie  Be alone in a quiet place  Pet an animal  Visit a friend  Journal  Exercise  Stretch   If you do not hear anything about your referrals in the next 1-2 weeks, call our office and ask for an update.  Let us know if you need anything.

## 2019-12-23 NOTE — Addendum Note (Signed)
Addended by: Sharon Seller B on: 12/23/2019 09:15 AM   Modules accepted: Orders

## 2019-12-24 ENCOUNTER — Telehealth: Payer: Self-pay | Admitting: Family Medicine

## 2019-12-24 NOTE — Telephone Encounter (Signed)
Caller name: Samreen Call back number: [phone]  Patient states she was referred to get a colonoscopy in [place] due to transportation issues she would like to see someone in [place].

## 2019-12-25 NOTE — Telephone Encounter (Signed)
thanks

## 2019-12-25 NOTE — Telephone Encounter (Signed)
Referral moved from [facility] to [place] location [phone]

## 2019-12-30 ENCOUNTER — Other Ambulatory Visit: Payer: Self-pay | Admitting: *Deleted

## 2019-12-30 ENCOUNTER — Telehealth: Payer: Self-pay | Admitting: Acute Care

## 2019-12-30 DIAGNOSIS — Z87891 Personal history of nicotine dependence: Secondary | ICD-10-CM

## 2019-12-30 NOTE — Telephone Encounter (Signed)
I have spoken with Andrea Ward and [name]'s LCS Ct has been scheduled same day as her mammogram on 01/12/2020 @ 4:00pm at [place] in [facility]

## 2020-01-12 ENCOUNTER — Ambulatory Visit (HOSPITAL_BASED_OUTPATIENT_CLINIC_OR_DEPARTMENT_OTHER)
Admission: RE | Admit: 2020-01-12 | Discharge: 2020-01-12 | Disposition: A | Discharge status: Home / Self Care | Payer: Medicare Other | Source: Ambulatory Visit | Attending: Acute Care | Admitting: Acute Care

## 2020-01-12 ENCOUNTER — Ambulatory Visit (HOSPITAL_BASED_OUTPATIENT_CLINIC_OR_DEPARTMENT_OTHER)
Admission: RE | Admit: 2020-01-12 | Discharge: 2020-01-12 | Disposition: A | Discharge status: Home / Self Care | Payer: Medicare Other | Source: Ambulatory Visit | Attending: Family Medicine | Admitting: Family Medicine

## 2020-01-12 ENCOUNTER — Encounter (HOSPITAL_BASED_OUTPATIENT_CLINIC_OR_DEPARTMENT_OTHER): Payer: Self-pay

## 2020-01-12 ENCOUNTER — Other Ambulatory Visit: Payer: Self-pay

## 2020-01-12 DIAGNOSIS — Z1231 Encounter for screening mammogram for malignant neoplasm of breast: Secondary | ICD-10-CM | POA: Diagnosis not present

## 2020-01-12 DIAGNOSIS — Z87891 Personal history of nicotine dependence: Secondary | ICD-10-CM | POA: Diagnosis not present

## 2020-01-14 NOTE — Progress Notes (Signed)
Please call patient and let them  know their  low dose Ct was read as a Lung RADS 2: nodules that are benign in appearance and behavior with a very low likelihood of becoming a clinically active cancer due to size or lack of growth. Recommendation per radiology is for a repeat LDCT in 12 months. .Please let them  know we will order and schedule their  annual screening scan for 12/2020. Please let them  know there was notation of CAD on their  scan.  Please remind the patient  that this is a non-gated exam therefore degree or severity of disease  cannot be determined. Please have them  follow up with their PCP regarding potential risk factor modification, dietary therapy or pharmacologic therapy if clinically indicated. Pt.  is  currently on statin therapy. Please place order for annual  screening scan for  12/2020 and fax results to PCP. Thanks so much. 

## 2020-01-19 ENCOUNTER — Encounter: Payer: Self-pay | Admitting: Family Medicine

## 2020-01-19 ENCOUNTER — Other Ambulatory Visit: Payer: Self-pay | Admitting: *Deleted

## 2020-01-19 DIAGNOSIS — I700 Atherosclerosis of aorta: Secondary | ICD-10-CM | POA: Insufficient documentation

## 2020-01-19 DIAGNOSIS — Z87891 Personal history of nicotine dependence: Secondary | ICD-10-CM

## 2020-03-10 ENCOUNTER — Other Ambulatory Visit: Payer: Self-pay | Admitting: Family Medicine

## 2020-03-10 DIAGNOSIS — J439 Emphysema, unspecified: Secondary | ICD-10-CM

## 2020-04-06 DIAGNOSIS — H35342 Macular cyst, hole, or pseudohole, left eye: Secondary | ICD-10-CM | POA: Diagnosis not present

## 2020-04-06 DIAGNOSIS — H35372 Puckering of macula, left eye: Secondary | ICD-10-CM | POA: Diagnosis not present

## 2020-04-06 DIAGNOSIS — H43821 Vitreomacular adhesion, right eye: Secondary | ICD-10-CM | POA: Diagnosis not present

## 2020-04-06 DIAGNOSIS — H43812 Vitreous degeneration, left eye: Secondary | ICD-10-CM | POA: Diagnosis not present

## 2020-04-06 DIAGNOSIS — H2513 Age-related nuclear cataract, bilateral: Secondary | ICD-10-CM | POA: Diagnosis not present

## 2020-04-11 ENCOUNTER — Other Ambulatory Visit: Payer: Self-pay | Admitting: Family Medicine

## 2020-04-11 MED ORDER — LEVOTHYROXINE SODIUM 75 MCG PO TABS: 75.0000 ug | ORAL_TABLET | Freq: Every day | ORAL | 2 refills | Status: DC

## 2020-06-03 DIAGNOSIS — J441 Chronic obstructive pulmonary disease with (acute) exacerbation: Secondary | ICD-10-CM | POA: Diagnosis not present

## 2020-06-03 DIAGNOSIS — Z8639 Personal history of other endocrine, nutritional and metabolic disease: Secondary | ICD-10-CM | POA: Diagnosis not present

## 2020-06-03 DIAGNOSIS — Z720 Tobacco use: Secondary | ICD-10-CM | POA: Diagnosis not present

## 2020-06-03 DIAGNOSIS — R0602 Shortness of breath: Secondary | ICD-10-CM | POA: Diagnosis not present

## 2020-06-03 DIAGNOSIS — D649 Anemia, unspecified: Secondary | ICD-10-CM | POA: Diagnosis not present

## 2020-06-03 DIAGNOSIS — E892 Postprocedural hypoparathyroidism: Secondary | ICD-10-CM | POA: Diagnosis not present

## 2020-06-03 DIAGNOSIS — E209 Hypoparathyroidism, unspecified: Secondary | ICD-10-CM | POA: Diagnosis not present

## 2020-06-03 DIAGNOSIS — F419 Anxiety disorder, unspecified: Secondary | ICD-10-CM | POA: Diagnosis not present

## 2020-06-03 DIAGNOSIS — Z87891 Personal history of nicotine dependence: Secondary | ICD-10-CM | POA: Diagnosis not present

## 2020-06-03 DIAGNOSIS — J449 Chronic obstructive pulmonary disease, unspecified: Secondary | ICD-10-CM | POA: Diagnosis not present

## 2020-06-03 DIAGNOSIS — E890 Postprocedural hypothyroidism: Secondary | ICD-10-CM | POA: Diagnosis not present

## 2020-06-04 DIAGNOSIS — K219 Gastro-esophageal reflux disease without esophagitis: Secondary | ICD-10-CM | POA: Diagnosis present

## 2020-06-04 DIAGNOSIS — F419 Anxiety disorder, unspecified: Secondary | ICD-10-CM | POA: Diagnosis present

## 2020-06-04 DIAGNOSIS — E209 Hypoparathyroidism, unspecified: Secondary | ICD-10-CM | POA: Diagnosis present

## 2020-06-04 DIAGNOSIS — Z7982 Long term (current) use of aspirin: Secondary | ICD-10-CM | POA: Diagnosis not present

## 2020-06-04 DIAGNOSIS — J441 Chronic obstructive pulmonary disease with (acute) exacerbation: Secondary | ICD-10-CM | POA: Diagnosis present

## 2020-06-04 DIAGNOSIS — R000 Tachycardia, unspecified: Secondary | ICD-10-CM | POA: Diagnosis not present

## 2020-06-04 DIAGNOSIS — Z87891 Personal history of nicotine dependence: Secondary | ICD-10-CM | POA: Diagnosis not present

## 2020-06-04 DIAGNOSIS — E890 Postprocedural hypothyroidism: Secondary | ICD-10-CM | POA: Diagnosis present

## 2020-06-04 DIAGNOSIS — Z9114 Patient's other noncompliance with medication regimen: Secondary | ICD-10-CM | POA: Diagnosis not present

## 2020-06-04 DIAGNOSIS — D649 Anemia, unspecified: Secondary | ICD-10-CM | POA: Diagnosis present

## 2020-06-13 ENCOUNTER — Telehealth: Payer: Self-pay | Admitting: Family Medicine

## 2020-06-13 NOTE — Telephone Encounter (Signed)
Spoke with patient she will call back need to setup transportation for her AWV

## 2020-06-17 ENCOUNTER — Other Ambulatory Visit: Payer: Self-pay

## 2020-06-17 ENCOUNTER — Encounter: Payer: Self-pay | Admitting: Family Medicine

## 2020-06-17 ENCOUNTER — Ambulatory Visit (INDEPENDENT_AMBULATORY_CARE_PROVIDER_SITE_OTHER): Payer: Medicare Other | Admitting: Family Medicine

## 2020-06-17 VITALS — BP 138/80 | HR 95 | Temp 97.7°F | Ht 65.0 in | Wt 112.1 lb

## 2020-06-17 DIAGNOSIS — E200 Idiopathic hypoparathyroidism: Secondary | ICD-10-CM | POA: Diagnosis not present

## 2020-06-17 DIAGNOSIS — J439 Emphysema, unspecified: Secondary | ICD-10-CM | POA: Diagnosis not present

## 2020-06-17 DIAGNOSIS — I700 Atherosclerosis of aorta: Secondary | ICD-10-CM | POA: Diagnosis not present

## 2020-06-17 DIAGNOSIS — M1991 Primary osteoarthritis, unspecified site: Secondary | ICD-10-CM

## 2020-06-17 DIAGNOSIS — Z0000 Encounter for general adult medical examination without abnormal findings: Secondary | ICD-10-CM

## 2020-06-17 LAB — COMPREHENSIVE METABOLIC PANEL
ALT: 5 U/L (ref 0–35)
AST: 12 U/L (ref 0–37)
Albumin: 3.5 g/dL (ref 3.5–5.2)
Alkaline Phosphatase: 67 U/L (ref 39–117)
BUN: 8 mg/dL (ref 6–23)
CO2: 31 mEq/L (ref 19–32)
Calcium: 6.9 mg/dL — ABNORMAL LOW (ref 8.4–10.5)
Chloride: 99 mEq/L (ref 96–112)
Creatinine, Ser: 0.73 mg/dL (ref 0.40–1.20)
GFR: 81.79 mL/min (ref >60.00)
Glucose, Bld: 107 mg/dL — ABNORMAL HIGH (ref 70–99)
Potassium: 4.1 mEq/L (ref 3.5–5.1)
Sodium: 139 mEq/L (ref 135–145)
Total Bilirubin: 0.4 mg/dL (ref 0.2–1.2)
Total Protein: 7.2 g/dL (ref 6.0–8.3)

## 2020-06-17 LAB — LIPID PANEL
Cholesterol: 139 mg/dL (ref 0–200)
HDL: 45.9 mg/dL (ref >39.00)
LDL Cholesterol: 77 mg/dL (ref 0–99)
NonHDL: 93.03
Total CHOL/HDL Ratio: 3
Triglycerides: 82 mg/dL (ref 0.0–149.0)
VLDL: 16.4 mg/dL (ref 0.0–40.0)

## 2020-06-17 MED ORDER — ATORVASTATIN CALCIUM 40 MG PO TABS: 40.0000 mg | ORAL_TABLET | Freq: Every day | ORAL | 3 refills | Status: DC

## 2020-06-17 MED ORDER — MELOXICAM 7.5 MG PO TABS: 7.5000 mg | ORAL_TABLET | Freq: Every day | ORAL | 0 refills | Status: DC

## 2020-06-17 MED ORDER — FLUTICASONE FUROATE-VILANTEROL 100-25 MCG/INH IN AEPB: 1.0000 | INHALATION_SPRAY | Freq: Every day | RESPIRATORY_TRACT | 11 refills | Status: DC

## 2020-06-17 MED ORDER — UMECLIDINIUM BROMIDE 62.5 MCG/INH IN AEPB: 1.0000 | INHALATION_SPRAY | Freq: Every day | RESPIRATORY_TRACT | 5 refills | Status: DC

## 2020-06-17 MED ORDER — PREDNISONE 20 MG PO TABS: 40.0000 mg | ORAL_TABLET | Freq: Every day | ORAL | 0 refills | Status: AC

## 2020-06-17 NOTE — Patient Instructions (Addendum)
The new Shingrix vaccine (for shingles) is a 2 shot series. It can make people feel low energy, achy and almost like they have the flu for 48 hours after injection. Please plan accordingly when deciding on when to get this shot. Call your pharmacy to get this. The second shot of the series is less severe regarding the side effects, but it still lasts 48 hours.   I recommend getting the covid vaccine booster.   If you do not hear anything about your referral in the next 1-2 weeks, call our office and ask for an update.  Let us know if you need anything.

## 2020-06-17 NOTE — Progress Notes (Signed)
Subjective:   Andrea Ward is a 76 y.o. female who presents for Medicare Annual (Subsequent) preventive examination.  Review of Systems    Lungs: +SOB MSK: +jt pain Neg 10 pt ROS otherwise    Patient has a history of COPD.  She is not currently on any maintenance inhalers due to cost.  She was sent in a long-acting muscarinic inhaler by her pulmonology team.  She is requesting a new pulmonologist closer to the [facility] area out of convenience of location.  She has been wheezing and coughing more.  Breathing has been more labored as well.  Albuterol is helpful but not completely eliminating the issue.  Objective:    Today's Vitals   06/17/20 1057  BP: 138/80  Pulse: 95  Temp: 97.7 F (36.5 C)  TempSrc: Oral  SpO2: 93%  Weight: 112 lb 2 oz (50.9 kg)  Height: 5\' 5"  (1.651 m)   Body mass index is 18.66 kg/m.  Advanced Directives 07/20/2019 02/26/2019 07/14/2018 07/09/2017 05/15/2016  Does Patient Have a Medical Advance Directive? Yes Yes Yes Yes No  Type of Paramedic of [place];Living will [place];Living will [place];Living will [place];Living will -  Does patient want to make changes to medical advance directive? No - Patient declined No - Patient declined No - Patient declined No - Patient declined -  Copy of [place] in Chart? No - copy requested No - copy requested No - copy requested No - copy requested -  Would patient like information on creating a medical advance directive? - - - - No - Patient declined    Current Medications (verified) Outpatient Encounter Medications as of 06/17/2020  Medication Sig  . atorvastatin (LIPITOR) 40 MG tablet   . busPIRone (BUSPAR) 15 MG tablet Take 1 tablet (15 mg total) by mouth 3 (three) times daily.  . calcitRIOL (ROCALTROL) 0.5 MCG capsule Take 1 tablet daily and 2 tablets daily on alternating days.  . Cyanocobalamin (VITAMIN  B-12 PO) Take 1 tablet by mouth daily.  Marland Kitchen levothyroxine (SYNTHROID) 75 MCG tablet Take 1 tablet (75 mcg total) by mouth daily before breakfast.  . propranolol (INDERAL) 20 MG tablet Take 1 tablet (20 mg total) by mouth 3 (three) times daily.  . Tiotropium Bromide-Olodaterol (STIOLTO RESPIMAT) 2.5-2.5 MCG/ACT AERS Inhale 2 puffs into the lungs daily.  . VENTOLIN HFA 108 (90 Base) MCG/ACT inhaler Inhale 1-2 puffs into the lungs every 6 (six) hours as needed for wheezing or shortness of breath.   Allergies (verified) Patient has no known allergies.   History: Past Medical History:  Diagnosis Date  . Aortic atherosclerosis ([place])   . Emphysema of lung ([place])   . Generalized anxiety disorder 05/23/2016  . GERD (gastroesophageal reflux disease) 05/23/2016  . History of pregnancy induced hypertension   . Hypoparathyroidism ([place])    Acquired post surg  . Hypothyroid    Past Surgical History:  Procedure Laterality Date  . THYROIDECTOMY  05/17/2016  . TONSILLECTOMY     Family History  Problem Relation Age of Onset  . Hypertension Mother   . Hypertension Father   . Anuerysm Sister    Social History   Tobacco Use  . Smoking status: Former Smoker    Packs/day: 0.75    Years: 50.00    Pack years: 37.50    Types: Cigarettes    Start date: 03/19/1965    Quit date: 11/10/2018    Years since  quitting: 1.6  . Smokeless tobacco: Never Used  . Tobacco comment: stopped smoknig august/2020!  Vaping Use  . Vaping Use: Never used  Substance and Sexual Activity  . Alcohol use: Yes    Alcohol/week: 5.0 standard drinks    Types: 5 Glasses of wine per week  . Drug use: Not Currently  . Sexual activity: Not Currently  Other Topics Concern  . Not on file  Social History Narrative  . Not on file   Social Determinants of Health   Financial Resource Strain: Medium Risk  . Difficulty of Paying Living Expenses: Somewhat hard  Food Insecurity: No Food Insecurity  . Worried About Charity fundraiser  in the Last Year: Never true  . Ran Out of Food in the Last Year: Never true  Transportation Needs: No Transportation Needs  . Lack of Transportation (Medical): No  . Lack of Transportation (Non-Medical): No  Physical Activity: Not on file  Stress: Not on file  Social Connections: Not on file    Tobacco Counseling Counseling given: Not Answered Comment: stopped smoknig august/2020!   Diabetic? No  Activities of Daily Living In your present state of health, do you have any difficulty performing the following activities: 06/17/20  Hearing? N  Vision? N  Difficulty concentrating or making decisions? N  Walking or climbing stairs? N  Dressing or bathing? N  Doing errands, shopping? N  Preparing Food and eating ? N  Using the Toilet? N  In the past six months, have you accidently leaked urine? N  Do you have problems with loss of bowel control? N  Managing your Medications? N  Managing your Finances? N  Housekeeping or managing your Housekeeping? N  Some recent data might be hidden    Patient Care Team: Shelda Pal, DO as PCP - General (Family Medicine) Buford Dresser, MD as PCP - Cardiology (Cardiology) Juanito Doom, MD as Consulting Physician (Pulmonary Disease)  Indicate any recent Medical Services you may have received from other than [facility] providers in the past year (date may be approximate).     Assessment:   This is a routine wellness examination for [name].  Hearing/Vision screen  Hearing Screening   125Hz  250Hz  500Hz  1000Hz  2000Hz  3000Hz  4000Hz  6000Hz  8000Hz   Right ear:   Pass Pass Pass  Pass    Left ear:   Fail Pass Pass  Pass      Visual Acuity Screening   Right eye Left eye Both eyes  Without correction: 20/40 20/40 20/30   With correction:       Dietary issues and exercise activities discussed.    Depression Screen PHQ 2/9 Scores 07/20/2019 07/14/2018 07/09/2017 05/23/2016  PHQ - 2 Score 0 0 0 0    Fall Risk Fall Risk   07/20/2019 07/14/2018 07/09/2017 05/23/2016  Falls in the past year? 0 0 No No  Number falls in past yr: 0 - - -  Injury with Fall? 0 - - -  Follow up Education provided;Falls prevention discussed - - -    FALL RISK PREVENTION PERTAINING TO THE HOME:  Any stairs in or around the home? Yes  If so, are there any without handrails? No  Home free of loose throw rugs in walkways, pet beds, electrical cords, etc? Yes  Adequate lighting in your home to reduce risk of falls? Yes   ASSISTIVE DEVICES UTILIZED TO PREVENT FALLS:  Life alert? No  Use of a cane, walker or w/c? No  Grab bars in the bathroom?  Yes  Shower chair or bench in shower? No  Elevated toilet seat or a handicapped toilet? No   TIMED UP AND GO:  Was the test performed? No .   Gait steady and fast with assistive device  Cognitive Function: MMSE - Mini Mental State Exam 07/09/2017  Orientation to time 5  Orientation to Place 5  Registration 3  Attention/ Calculation 5  Recall 2  Language- name 2 objects 2  Language- repeat 1  Language- follow 3 step command 3  Language- read & follow direction 1  Write a sentence 1  Copy design 1  Total score 29   Immunizations Immunization History  Administered Date(s) Administered  . Moderna Sars-Covid-2 Vaccination 07/09/2019, 08/06/2019  . Pneumococcal Conjugate-13 05/23/2016  . Pneumococcal Polysaccharide-23 07/10/2017  . Tdap 05/23/2016    TDAP status: Up to date  Flu Vaccine status: Up to date  Pneumococcal vaccine status: Up to date  Got initial series, will get booster.   Qualifies for Shingles Vaccine? Yes, has not scheduled yet.  Screening Tests Health Maintenance  Topic Date Due  . COVID-19 Vaccine (3 - Moderna risk 4-dose series) 09/03/2019  . INFLUENZA VACCINE  06/17/2026 (Originally 10/17/2020)  . MAMMOGRAM  01/11/2022  . COLONOSCOPY (Pts 45-64yrs Insurance coverage will need to be confirmed)  03/23/2026  . TETANUS/TDAP  05/24/2026  . DEXA SCAN   Completed  . Hepatitis C Screening  Completed  . PNA vac Low Risk Adult  Completed  . HPV VACCINES  Aged Out    Health Maintenance  Health Maintenance Due  Topic Date Due  . COVID-19 Vaccine (3 - Moderna risk 4-dose series) 09/03/2019    Colorectal cancer screening: Type of screening: Colonoscopy. She is due in Jan 2023.   Mammogram status: Completed 01/12/20. Repeat every year  Bone Density status: Completed 05/31/16. Results reflect: Bone density results: OSTEOPENIA. Repeat every 5 years.  Lung Cancer Screening: (Low Dose CT Chest recommended if Age 40-80 years, 30 pack-year currently smoking OR have quit w/in 15years.) does qualify.   Lung Cancer Screening Referral: test ordered by pulm  Additional Screening:  Hepatitis C Screening: does qualify; Completed 07/10/17  Vision Screening: Recommended annual ophthalmology exams for early detection of glaucoma and other disorders of the eye. Is the patient up to date with their annual eye exam?  Yes  Who is the provider or what is the name of the office in which the patient attends annual eye exams? Dr. Amedeo Plenty w [facility] If pt is not established with a provider, would they like to be referred to a provider to establish care? Yes  Dental Screening: Recommended annual dental exams for proper oral hygiene  Community Resource Referral / Chronic Care Management: CRR required this visit?  No   CCM required this visit?  No   Endocrinology: Dr. Posey Pronto Pulmonology: Dr Valeta Harms Cardiology: Dr. Harrell Gave Ophtho: Dr. Amedeo Plenty   Plan:  Medicare annual wellness visit, subsequent  Pulmonary emphysema, unspecified emphysema type ([employer]) - Plan: Ambulatory referral to Pulmonology, umeclidinium bromide (INCRUSE ELLIPTA) 62.5 MCG/INH AEPB, fluticasone furoate-vilanterol (BREO ELLIPTA) 100-25 MCG/INH AEPB, predniSONE (DELTASONE) 20 MG tablet  Primary osteoarthritis, unspecified site - Plan: meloxicam (MOBIC) 7.5 MG tablet  Idiopathic  hypoparathyroidism (HCC)  Aortic atherosclerosis ([place]) - Plan: atorvastatin (LIPITOR) 40 MG tablet, Comprehensive metabolic panel, Lipid panel   I have personally reviewed and noted the following in the patient's chart:   . Medical and social history . Use of alcohol, tobacco or illicit drugs  . Current  medications and supplements . Functional ability and status . Nutritional status . Physical activity . Advanced directives . List of other physicians . Hospitalizations, surgeries, and ER visits in previous 12 months . Vitals . Screenings to include cognitive, depression, and falls . Referrals and appointments  In addition, I have reviewed and discussed with patient certain preventive protocols, quality metrics, and best practice recommendations. A written personalized care plan for preventive services as well as general preventive health recommendations were provided to patient.     [place], DO   06/17/2020

## 2020-06-21 DIAGNOSIS — E892 Postprocedural hypoparathyroidism: Secondary | ICD-10-CM | POA: Diagnosis not present

## 2020-07-06 DIAGNOSIS — H35343 Macular cyst, hole, or pseudohole, bilateral: Secondary | ICD-10-CM | POA: Diagnosis not present

## 2020-07-06 DIAGNOSIS — E890 Postprocedural hypothyroidism: Secondary | ICD-10-CM | POA: Diagnosis not present

## 2020-07-06 DIAGNOSIS — H2513 Age-related nuclear cataract, bilateral: Secondary | ICD-10-CM | POA: Diagnosis not present

## 2020-07-06 DIAGNOSIS — H43821 Vitreomacular adhesion, right eye: Secondary | ICD-10-CM | POA: Diagnosis not present

## 2020-07-06 DIAGNOSIS — H43812 Vitreous degeneration, left eye: Secondary | ICD-10-CM | POA: Diagnosis not present

## 2020-07-06 DIAGNOSIS — H35372 Puckering of macula, left eye: Secondary | ICD-10-CM | POA: Diagnosis not present

## 2020-07-07 DIAGNOSIS — E890 Postprocedural hypothyroidism: Secondary | ICD-10-CM | POA: Diagnosis not present

## 2020-07-07 DIAGNOSIS — E892 Postprocedural hypoparathyroidism: Secondary | ICD-10-CM | POA: Diagnosis not present

## 2020-07-18 ENCOUNTER — Other Ambulatory Visit: Payer: Self-pay | Admitting: Family Medicine

## 2020-07-18 DIAGNOSIS — M1991 Primary osteoarthritis, unspecified site: Secondary | ICD-10-CM

## 2020-07-28 ENCOUNTER — Other Ambulatory Visit: Payer: Self-pay | Admitting: Family Medicine

## 2020-07-28 DIAGNOSIS — F411 Generalized anxiety disorder: Secondary | ICD-10-CM

## 2020-08-23 ENCOUNTER — Other Ambulatory Visit: Payer: Self-pay | Admitting: Family Medicine

## 2020-08-23 DIAGNOSIS — M1991 Primary osteoarthritis, unspecified site: Secondary | ICD-10-CM

## 2020-09-13 DIAGNOSIS — E890 Postprocedural hypothyroidism: Secondary | ICD-10-CM | POA: Diagnosis not present

## 2020-09-13 DIAGNOSIS — E892 Postprocedural hypoparathyroidism: Secondary | ICD-10-CM | POA: Diagnosis not present

## 2020-09-14 ENCOUNTER — Encounter: Payer: Self-pay | Admitting: Family Medicine

## 2020-09-14 ENCOUNTER — Ambulatory Visit (INDEPENDENT_AMBULATORY_CARE_PROVIDER_SITE_OTHER): Payer: Medicare Other | Admitting: Family Medicine

## 2020-09-14 ENCOUNTER — Other Ambulatory Visit: Payer: Self-pay

## 2020-09-14 VITALS — BP 160/80 | HR 85 | Temp 98.2°F | Ht 66.0 in | Wt 115.5 lb

## 2020-09-14 DIAGNOSIS — J439 Emphysema, unspecified: Secondary | ICD-10-CM | POA: Diagnosis not present

## 2020-09-14 DIAGNOSIS — R0600 Dyspnea, unspecified: Secondary | ICD-10-CM | POA: Diagnosis not present

## 2020-09-14 DIAGNOSIS — J441 Chronic obstructive pulmonary disease with (acute) exacerbation: Secondary | ICD-10-CM

## 2020-09-14 DIAGNOSIS — R0609 Other forms of dyspnea: Secondary | ICD-10-CM

## 2020-09-14 DIAGNOSIS — R030 Elevated blood-pressure reading, without diagnosis of hypertension: Secondary | ICD-10-CM

## 2020-09-14 DIAGNOSIS — R600 Localized edema: Secondary | ICD-10-CM

## 2020-09-14 MED ORDER — ALBUTEROL SULFATE (2.5 MG/3ML) 0.083% IN NEBU: 2.5000 mg | INHALATION_SOLUTION | Freq: Four times a day (QID) | RESPIRATORY_TRACT | 1 refills | Status: AC | PRN

## 2020-09-14 MED ORDER — PREDNISONE 20 MG PO TABS: 40.0000 mg | ORAL_TABLET | Freq: Every day | ORAL | 0 refills | Status: AC

## 2020-09-14 NOTE — Patient Instructions (Addendum)
Call your pulmonary team to see if they have inhaler samples.  We have reached out to a care coordination team to help with affording these inhalers.   For the swelling in your lower extremities, be sure to elevate your legs when able, mind the salt intake, stay physically active and consider wearing compression stockings.  Check your blood pressures 2-3 times per week, alternating the time of day you check it. If it is high, considering waiting 1-2 minutes and rechecking. If it gets higher, your anxiety is likely creeping up and we should avoid rechecking.   Let us know if you need anything.

## 2020-09-14 NOTE — Progress Notes (Signed)
Chief Complaint  Patient presents with   Edema    Andrea Ward here for bilateral leg swelling.  Duration: 3 days Hx of prolonged bedrest, recent surgery, travel or injury? No Pain the calf? No SOB? Yes; DOE as well Personal or family history of clot or bleeding disorder? No Hx of heart failure, renal failure, hepatic failure? No Pulse ox at home has been low 90's.  She has not been taking her inhalers at home due to cost of medication.   Past Medical History:  Diagnosis Date   Aortic atherosclerosis (HCC)    Emphysema of lung ([place])    Generalized anxiety disorder 05/23/2016   GERD (gastroesophageal reflux disease) 05/23/2016   History of pregnancy induced hypertension    Hypoparathyroidism ([place])    Acquired post surg   Hypothyroid    Family History  Problem Relation Age of Onset   Hypertension Mother    Hypertension Father    Anuerysm Sister    Past Surgical History:  Procedure Laterality Date   THYROIDECTOMY  05/17/2016   TONSILLECTOMY      Current Outpatient Medications:    albuterol (PROVENTIL) (2.5 MG/3ML) 0.083% nebulizer solution, Take 3 mLs (2.5 mg total) by nebulization every 6 (six) hours as needed for wheezing or shortness of breath., Disp: 150 mL, Rfl: 1   atorvastatin (LIPITOR) 40 MG tablet, Take 1 tablet (40 mg total) by mouth daily., Disp: 90 tablet, Rfl: 3   busPIRone (BUSPAR) 15 MG tablet, TAKE 1 TABLET BY MOUTH THREE TIMES DAILY, Disp: 270 tablet, Rfl: 0   calcitRIOL (ROCALTROL) 0.5 MCG capsule, Take 1 tablet daily and 2 tablets daily on alternating days., Disp: , Rfl:    Cyanocobalamin (VITAMIN B-12 PO), Take 1 tablet by mouth daily., Disp: , Rfl:    fluticasone furoate-vilanterol (BREO ELLIPTA) 100-25 MCG/INH AEPB, Inhale 1 puff into the lungs daily at 6 (six) AM., Disp: 1 each, Rfl: 11   meloxicam (MOBIC) 7.5 MG tablet, Take 1 tablet by mouth once daily, Disp: 30 tablet, Rfl: 0   predniSONE (DELTASONE) 20 MG tablet, Take 2 tablets (40 mg total) by  mouth daily with breakfast for 5 days., Disp: 10 tablet, Rfl: 0   propranolol (INDERAL) 20 MG tablet, Take 1 tablet (20 mg total) by mouth 3 (three) times daily., Disp: 270 tablet, Rfl: 2   umeclidinium bromide (INCRUSE ELLIPTA) 62.5 MCG/INH AEPB, Inhale 1 puff into the lungs daily at 6 (six) AM., Disp: 30 each, Rfl: 5   VENTOLIN HFA 108 (90 Base) MCG/ACT inhaler, Inhale 1-2 puffs into the lungs every 6 (six) hours as needed for wheezing or shortness of breath., Disp: 18 g, Rfl: 5   levothyroxine (SYNTHROID) 75 MCG tablet, Take 1 tablet (75 mcg total) by mouth daily before breakfast., Disp: 90 tablet, Rfl: 2  BP (!) 160/80   Pulse 85   Temp 98.2 F (36.8 C) (Oral)   Ht 5\' 6"  (1.676 m)   Wt 115 lb 8 oz (52.4 kg)   SpO2 90%   BMI 18.64 kg/m  Gen- awake, alert, appears stated age Heart- RRR, no gallops, +LE edema around ankles and feet Lungs- +diffuse wheezing, normal effort w/o accessory muscle use MSK- no calf pain Psych: Age appropriate judgment and insight  COPD exacerbation (HCC) - Plan: predniSONE (DELTASONE) 20 MG tablet  Pulmonary emphysema, unspecified emphysema type ([place]) - Plan: AMB Referral to [place], Ambulatory referral to Pulmonology  DOE (dyspnea on exertion) - Plan: ECHOCARDIOGRAM COMPLETE  Bilateral lower extremity edema  Elevated blood pressure reading  Exacerbation of chronic issue. 5 d pred burst, 40 mg/d. While this could exacerbate her swelling, I think the benefits outweigh the risks given her breathing. Monitor O2 at home. Ck Echo. Monitor BP at home.  F/u in 1 week to reck BP and breathing.  Pt voiced understanding and agreement to the plan.  [place], DO 09/14/20  4:30 PM

## 2020-09-15 ENCOUNTER — Telehealth: Payer: Self-pay | Admitting: Pulmonary Disease

## 2020-09-15 DIAGNOSIS — H43821 Vitreomacular adhesion, right eye: Secondary | ICD-10-CM | POA: Diagnosis not present

## 2020-09-15 DIAGNOSIS — H52203 Unspecified astigmatism, bilateral: Secondary | ICD-10-CM | POA: Diagnosis not present

## 2020-09-15 DIAGNOSIS — H5213 Myopia, bilateral: Secondary | ICD-10-CM | POA: Diagnosis not present

## 2020-09-15 DIAGNOSIS — H2513 Age-related nuclear cataract, bilateral: Secondary | ICD-10-CM | POA: Diagnosis not present

## 2020-09-15 DIAGNOSIS — H35342 Macular cyst, hole, or pseudohole, left eye: Secondary | ICD-10-CM | POA: Diagnosis not present

## 2020-09-15 DIAGNOSIS — H43813 Vitreous degeneration, bilateral: Secondary | ICD-10-CM | POA: Diagnosis not present

## 2020-09-15 DIAGNOSIS — H524 Presbyopia: Secondary | ICD-10-CM | POA: Diagnosis not present

## 2020-09-15 NOTE — Telephone Encounter (Signed)
Pt is scheduled to see BI on 11/10/20---we have none of these samples in the office.  I have called and LM on VM for the pt .

## 2020-09-16 NOTE — Telephone Encounter (Signed)
Called and spoke with patient. Advised her that we do not have any Breo or Incruse inhalers in office. Patient verbalized understanding. Nothing further needed at this time.

## 2020-09-20 ENCOUNTER — Other Ambulatory Visit: Payer: Self-pay | Admitting: Family Medicine

## 2020-09-20 ENCOUNTER — Telehealth: Payer: Self-pay | Admitting: *Deleted

## 2020-09-20 DIAGNOSIS — R0609 Other forms of dyspnea: Secondary | ICD-10-CM

## 2020-09-20 DIAGNOSIS — J439 Emphysema, unspecified: Secondary | ICD-10-CM

## 2020-09-20 DIAGNOSIS — Z20822 Contact with and (suspected) exposure to covid-19: Secondary | ICD-10-CM | POA: Diagnosis not present

## 2020-09-20 DIAGNOSIS — J441 Chronic obstructive pulmonary disease with (acute) exacerbation: Secondary | ICD-10-CM

## 2020-09-20 DIAGNOSIS — R600 Localized edema: Secondary | ICD-10-CM

## 2020-09-20 NOTE — Chronic Care Management (AMB) (Signed)
  Chronic Care Management   Note  09/20/2020 Name: Remonia Otte MRN: [id number] DOB: 07/20/1945  Miyako Oelke is a 75 y.o. year old female who is a primary care patient of Shelda Pal, DO. I reached out to [employer] by phone today in response to a referral sent by Ms. Cynda Familia PCP, Nani Ravens, Crosby Oyster, DO      Ms. Sturgell was given information about Chronic Care Management services today including:  CCM service includes personalized support from designated clinical staff supervised by her physician, including individualized plan of care and coordination with other care providers 24/7 contact phone numbers for assistance for urgent and routine care needs. Service will only be billed when office clinical staff spend 20 minutes or more in a month to coordinate care. Only one practitioner may furnish and bill the service in a calendar month. The patient may stop CCM services at any time (effective at the end of the month) by phone call to the office staff. The patient will be responsible for cost sharing (co-pay) of up to 20% of the service fee (after annual deductible is met).  Patient agreed to services and verbal consent obtained.   Follow up plan: Telephone appointment with care management team member scheduled for:09/29/2020  [place] Management

## 2020-09-20 NOTE — Chronic Care Management (AMB) (Signed)
  Chronic Care Management   Outreach Note  09/20/2020 Name: Andrea Ward MRN: [id number] DOB: [birth year]  Andrea Ward is a 75 y.o. year old female who is a primary care patient of Shelda Pal, DO. I reached out to Andrea Ward by phone today in response to a referral sent by Ms. Andrea Ward PCP, Andrea Ward, Andrea Oyster, DO      An unsuccessful telephone outreach was attempted today. The patient was referred to the case management team for assistance with care management and care coordination.   Follow Up Plan: A HIPAA compliant phone message was left for the patient providing contact information and requesting a return call. The care management team will reach out to the patient again over the next 7 days.  If patient returns call to provider office, please advise to call [place] at [phone].  [place] Management

## 2020-09-20 NOTE — Progress Notes (Signed)
referral

## 2020-09-21 ENCOUNTER — Other Ambulatory Visit: Payer: Self-pay

## 2020-09-21 ENCOUNTER — Encounter: Payer: Self-pay | Admitting: Family Medicine

## 2020-09-21 ENCOUNTER — Ambulatory Visit (INDEPENDENT_AMBULATORY_CARE_PROVIDER_SITE_OTHER): Payer: Medicare Other | Admitting: Family Medicine

## 2020-09-21 VITALS — BP 138/76 | HR 79 | Temp 98.5°F | Ht 66.0 in | Wt 118.2 lb

## 2020-09-21 DIAGNOSIS — M1991 Primary osteoarthritis, unspecified site: Secondary | ICD-10-CM | POA: Diagnosis not present

## 2020-09-21 DIAGNOSIS — J439 Emphysema, unspecified: Secondary | ICD-10-CM | POA: Diagnosis not present

## 2020-09-21 MED ORDER — PREDNISONE 20 MG PO TABS: 20.0000 mg | ORAL_TABLET | Freq: Every day | ORAL | 0 refills | Status: DC

## 2020-09-21 MED ORDER — MELOXICAM 7.5 MG PO TABS: 7.5000 mg | ORAL_TABLET | Freq: Every day | ORAL | 0 refills | Status: DC

## 2020-09-21 NOTE — Progress Notes (Signed)
Chief Complaint  Patient presents with   Follow-up    Subjective: Patient is a 75 y.o. female here for follow-up.  Patient treated for an exacerbation of her COPD last week with a 5-day course of prednisone.  She felt 100% better while on the medicine.  Breathing is starting to worsen slightly again.  We did reach out to a rep for Trelegy which she is supposed to be on.  She has reach out to her pulmonology team and they do not have samples.  She is not coughing, mucus is scant at this time.  No chest pain or fevers.  Past Medical History:  Diagnosis Date   Aortic atherosclerosis (HCC)    Emphysema of lung ([place])    Generalized anxiety disorder 05/23/2016   GERD (gastroesophageal reflux disease) 05/23/2016   History of pregnancy induced hypertension    Hypoparathyroidism (HCC)    Acquired post surg   Hypothyroid     Objective: BP 138/76   Pulse 79   Temp 98.5 F (36.9 C) (Oral)   Ht 5\' 6"  (1.676 m)   Wt 118 lb 4 oz (53.6 kg)   SpO2 90%   BMI 19.09 kg/m  General: Awake, appears stated age HEENT: MMM, EOMi Heart: RRR Lungs: She is moving much better air today, very faint wheezing at the bases.  No rales or accessory muscle use. Psych: Age appropriate judgment and insight, normal affect and mood  Assessment and Plan: Pulmonary emphysema, unspecified emphysema type ([place])  Primary osteoarthritis, unspecified site - Plan: meloxicam (MOBIC) 7.5 MG tablet  Chronic, uncontrolled.  I will prescribe her a lower dose of prednisone until she can get the sample of Trelegy.  I will put her on 20 mg daily for now.  I anticipate getting this in 2 days or maybe next week.  She has an appointment with her pulmonologist in October.  I believe I can get her enough samples/payment card prescriptions to hold her over. The patient voiced understanding and agreement to the plan.  [place], DO 09/21/20  2:27 PM

## 2020-09-21 NOTE — Patient Instructions (Signed)
Take 1 tab daily of the prednisone until you hear from [place] regarding an inhaler sample.  Let us know if you need anything.

## 2020-09-23 ENCOUNTER — Telehealth: Payer: Self-pay | Admitting: Family Medicine

## 2020-09-23 NOTE — Telephone Encounter (Signed)
Called the patient to inform we have received samples of Trelegy. Can pickup at her convenience. #4 boxes Trelegy 100 mcg/62.5/25 mcg. NID#[id number] Lot #[id number] Exp. Date 04/2022.

## 2020-09-29 ENCOUNTER — Ambulatory Visit (INDEPENDENT_AMBULATORY_CARE_PROVIDER_SITE_OTHER): Payer: Medicare Other | Admitting: Pharmacist

## 2020-09-29 DIAGNOSIS — J439 Emphysema, unspecified: Secondary | ICD-10-CM | POA: Diagnosis not present

## 2020-09-29 DIAGNOSIS — R030 Elevated blood-pressure reading, without diagnosis of hypertension: Secondary | ICD-10-CM

## 2020-09-29 DIAGNOSIS — I700 Atherosclerosis of aorta: Secondary | ICD-10-CM

## 2020-09-29 DIAGNOSIS — E200 Idiopathic hypoparathyroidism: Secondary | ICD-10-CM

## 2020-09-30 ENCOUNTER — Telehealth: Payer: Self-pay | Admitting: Pulmonary Disease

## 2020-09-30 NOTE — Telephone Encounter (Signed)
I called and spoke with Tammy regarding Trelegy script to [facility]. According to our notes at last OV with Dr. Valeta Harms on 05/10/19, patient was started on Stiolto and was told to stop Anoro. It also looks like PCP Dr. Nani Ravens sent in script for Trelegy on 09/21/20 and I informed tammy I have not heard of that pharmacy but should call and ask PCP office. Patient does have an appt with Dr. Valeta Harms on 11/10/20. Tammy verbalized understanding, nothing else further needed.

## 2020-10-02 NOTE — Chronic Care Management (AMB) (Signed)
Chronic Care Management Pharmacy Note  10/02/2020 Name:  Andrea Ward MRN:  [id number] DOB:  [birth year]   Subjective: Andrea Ward is an 75 y.o. year old female who is a primary patient of Shelda Pal, DO.  The CCM team was consulted for assistance with disease management and care coordination needs.    Engaged with patient by telephone for initial visit in response to provider referral for pharmacy case management and/or care coordination services.   Consent to Services:  The patient was given the following information about Chronic Care Management services today, agreed to services, and gave verbal consent: 1. CCM service includes personalized support from designated clinical staff supervised by the primary care provider, including individualized plan of care and coordination with other care providers 2. 24/7 contact phone numbers for assistance for urgent and routine care needs. 3. Service will only be billed when office clinical staff spend 20 minutes or more in a month to coordinate care. 4. Only one practitioner may furnish and bill the service in a calendar month. 5.The patient may stop CCM services at any time (effective at the end of the month) by phone call to the office staff. 6. The patient will be responsible for cost sharing (co-pay) of up to 20% of the service fee (after annual deductible is met). Patient agreed to services and consent obtained.  Patient Care Team: Shelda Pal, DO as PCP - General (Family Medicine) Buford Dresser, MD as PCP - Cardiology (Cardiology) Juanito Doom, MD as Consulting Physician (Pulmonary Disease) [name], PharmD (Pharmacist) Garner Nash, DO as Consulting Physician (Pulmonary Disease)  Recent office visits: 09/23/2020 - PCP - phone call - patient to pick up #4 samples of Trelegy 09/21/2020 - PCP (Dr Nani Ravens) F/U COPD. Prescribed prednisone 57m daily until she can get Trelegy. Prescribed  meloxicam 7.522mdaily for osteoarthritis.  09/14/2020 - PCP (Dr WeNani RavensCOPD exacerbation; Rx prednisone 4090md for 5 days; albuterol inhaler or nebs as needed  Recent consult visits: 09/15/2020 - ophthalmology ([facility] - Dr. ForTrinna Postataract eval; no surgery for cataract removal at this time - monitoring;  09/13/2020 - Endo ([facility] - Dr PatPosey Pronto/U post-surgical hypoparathyroidism and hypothyroidism. Repeat TSH, T4, calcium and PTH; reflled calcitriol. Refill Synthroid.   [facility] hypocal[facility] h/o hypoparathyroidism and COPD exacerbation. Ca+ was 5.6 at endo and instructed to go to ER.  New Meds - restart calcitriol 0.5mc79maily. Meds discontinued at discharge: cholecalciferol 1000 units; Aspirin 81mg20morvastatin 40mg,81motidine 20mg,f4mus sulfate.    Objective:  Lab Results  Component Value Date   CREATININE 0.73 06/17/2020   CREATININE 0.78 02/10/2019   CREATININE 0.81 02/02/2019    No results found for: HGBA1C Last diabetic Eye exam: No results found for: HMDIABEYEEXA  Last diabetic Foot exam: No results found for: HMDIABFOOTEX      Component Value Date/Time   CHOL 139 06/17/2020 1158   TRIG 82.0 06/17/2020 1158   HDL 45.90 06/17/2020 1158   CHOLHDL 3 06/17/2020 1158   VLDL 16.4 06/17/2020 1158   LDLCALC 77 06/17/2020 1158    Hepatic Function Latest Ref Rng & Units 06/17/2020 02/10/2019 02/02/2019  Total Protein 6.0 - 8.3 g/dL 7.2 7.7 6.9  Albumin 3.5 - 5.2 g/dL 3.5 3.9 3.6  AST 0 - 37 U/L 12 15 16   ALT 0 - 35 U/L 5 5 5   Alk Phosphatase 39 - 117 U/L 67 62 57  Total Bilirubin 0.2 - 1.2 mg/dL 0.4 0.5 0.4  Bilirubin, Direct 0.1 - 0.5 mg/dL - - -    Lab Results  Component Value Date/Time   TSH 0.18 (L) 12/23/2019 09:07 AM   TSH 19.37 (H) 02/02/2019 02:05 PM   FREET4 1.5 12/23/2019 09:07 AM   FREET4 1.04 02/02/2019 02:05 PM    CBC Latest Ref Rng & Units 02/26/2019 02/10/2019  02/02/2019  WBC 4.0 - 10.5 K/uL 7.2 7.6 6.4  Hemoglobin 12.0 - 15.0 g/dL 10.8(L) 9.7(L) 9.8(L)  Hematocrit 36.0 - 46.0 % 34.7(L) 31.8(L) 30.5(L)  Platelets 150 - 400 K/uL 318 396 442.0(H)    No results found for: VD25OH  Clinical ASCVD: Yes  The 10-year ASCVD risk score Mikey Bussing DC Jr., et al., 2013) is: 24.1%   Values used to calculate the score:     Age: 52 years     Sex: Female     Is Non-Hispanic African American: Yes     Diabetic: Yes     Tobacco smoker: No     Systolic Blood Pressure: 643 mmHg     Is BP treated: Yes     HDL Cholesterol: 45.9 mg/dL     Total Cholesterol: 139 mg/dL     Social History   Tobacco Use  Smoking Status Former   Packs/day: 0.75   Years: 50.00   Pack years: 37.50   Types: Cigarettes   Start date: 03/19/1965   Quit date: 11/10/2018   Years since quitting: 1.8  Smokeless Tobacco Never  Tobacco Comments   stopped smoknig august/2020!   BP Readings from Last 3 Encounters:  09/21/20 138/76  09/14/20 (!) 160/80  06/17/20 138/80   Pulse Readings from Last 3 Encounters:  09/21/20 79  09/14/20 85  06/17/20 95   Wt Readings from Last 3 Encounters:  09/21/20 118 lb 4 oz (53.6 kg)  09/14/20 115 lb 8 oz (52.4 kg)  06/17/20 112 lb 2 oz (50.9 kg)    Assessment: Review of patient past medical history, allergies, medications, health status, including review of consultants reports, laboratory and other test data, was performed as part of comprehensive evaluation and provision of chronic care management services.   SDOH:  (Social Determinants of Health) assessments and interventions performed:  SDOH Interventions    Flowsheet Row Most Recent Value  SDOH Interventions   Financial Strain Interventions Other (Comment)  [Looking in to patient assistance for Trelegy,  though patient was contacted by [employer] today to assist with cost of Trelegy.]  Physical Activity Interventions Other (Comments)  [current recoverring from COPD exacerbation,   will work on exericse once respiratory status stable.]       CCM Care Plan  No Known Allergies  Medications Reviewed Today     Reviewed by [name], PharmD (Pharmacist) on 09/29/20 at 38  Med List Status: <None>   Medication Order Taking? Sig Documenting Provider Last Dose Status Informant  albuterol (PROVENTIL) (2.5 MG/3ML) 0.083% nebulizer solution [id number] Yes Take 3 mLs (2.5 mg total) by nebulization every 6 (six) hours as needed for wheezing or shortness of breath. Shelda Pal, DO Taking Active   atorvastatin (LIPITOR) 40 MG tablet [id number] Yes Take 1 tablet (40 mg total) by mouth daily. Shelda Pal, DO Taking Active   busPIRone (BUSPAR) 15 MG tablet [id number] Yes TAKE 1 TABLET BY MOUTH THREE TIMES DAILY Shelda Pal, DO Taking Active   calcitRIOL (ROCALTROL) 0.5 MCG capsule [id number] Yes Take 2 tablets daily 6 days per  week (none on Sundays) [provider] Taking Active   cholecalciferol (VITAMIN D3) 25 MCG (1000 UNIT) tablet [id number] Yes Take 1,000 Units by mouth daily. [provider] Taking Active   Cyanocobalamin (VITAMIN B-12 PO) [id number] Yes Take 1,000 mcg by mouth daily. [provider] Taking Active   Fluticasone-Umeclidin-Vilant (TRELEGY ELLIPTA) 100-62.5-25 MCG/INH AEPB [id number] Yes Inhale 1 puff into the lungs daily. [provider] Taking Active   levothyroxine (SYNTHROID) 75 MCG tablet [id number] Yes Take 1 tablet (75 mcg total) by mouth daily before breakfast. Shelda Pal, DO Taking Active   meloxicam (MOBIC) 7.5 MG tablet [id number] Yes Take 1 tablet (7.5 mg total) by mouth daily. Shelda Pal, DO Taking Active   propranolol (INDERAL) 20 MG tablet [id number] Yes Take 1 tablet (20 mg total) by mouth 3 (three) times daily. Shelda Pal, DO Taking Active   VENTOLIN HFA 108 [phone] Base) MCG/ACT inhaler [id number] Yes Inhale 1-2 puffs into the lungs every 6 (six)  hours as needed for wheezing or shortness of breath. Shelda Pal, DO Taking Active             Patient Active Problem List   Diagnosis Date Noted   Aortic atherosclerosis ([place])    Bilateral carotid artery stenosis 08/27/2019   Coronary artery calcification seen on CT scan 08/27/2019   Idiopathic hypoparathyroidism ([place]) 02/02/2019   Ischemic colitis ([place]) 02/02/2019   Medication management 01/05/2019   Healthcare maintenance 12/17/2018   Former smoker 04/14/2018   Pulmonary emphysema ([place]) 01/09/2018   Tremor 08/19/2017   Tachycardia 07/10/2017   Hypothyroid    Hypothyroidism 06/12/2016   Pharyngeal dysphagia 06/12/2016   Quality of voice, hoarse 06/12/2016   Goiter 05/23/2016   GERD (gastroesophageal reflux disease) 05/23/2016   Generalized anxiety disorder 05/23/2016    Immunization History  Administered Date(s) Administered   Moderna Sars-Covid-2 Vaccination 07/09/2019, 08/06/2019   Pneumococcal Conjugate-13 05/23/2016   Pneumococcal Polysaccharide-23 07/10/2017   Tdap 05/23/2016    Conditions to be addressed/monitored: CAD, HLD, COPD, Anxiety, and hypothyroidism; hypoparathyroidims; hypocalcemia; tremor; pain  Care Plan  Current Barriers:  Unable to independently afford treatment regimen Unable to achieve control of COPD  Does not adhere to prescribed medication regimen  Pharmacist Clinical Goal(s):  Over the next 90 days, patient will verbalize ability to afford treatment regimen achieve control of COPD as evidenced by decreased exacerbations and increased use of maintenance inhaler maintain control of HDL as evidenced by LDL <70  adhere to prescribed medication regimen as evidenced by refill history through collaboration with PharmD and provider.   Interventions: 1:1 collaboration with Shelda Pal, DO regarding development and update of comprehensive plan of care as evidenced by provider attestation and  co-signature Inter-disciplinary care team collaboration (see longitudinal plan of care) Comprehensive medication review performed; medication list updated in electronic medical record  Hyperlipidemia: Not at goal; LDL goal <70 - last LDL was 77  Noted to have carotid artery stenosis, aortic atherosclerosis and positive coronary artery calcium on CT scan  Current treatment: atorvastatin 71m daily  Medications previously tried: none  Interventions:   Educated on LDL goals Recommended patient continue to take atorvastatin 464mdaily  Recommended she limit intake of saturated and trans fat If LDL continues to be >70, consider increasing atorvastatin to 8066maily or switch to rosuvastatin 39m63mily   Chronic Obstructive Pulmonary Disease: Uncontrolled but improving since started Trelegy:  Current treatment:Trelegy Inhaler 100mc35minhale one puff into lungs once a day  GOLD Classification: D MMRC/CAT score: 15 2 exacerbations requiring treatment in the last 6 months  Interventions: Educated on maintenance versus rescue inhalers Assessed patient finances. She was contacted by [facility] to get Trelegy for $35. Was not familiar with this company. So I contact them [phone]; I appears they will assist patient in filling out applications for patient assistance and charge $35/month for 1 medication and  $49 for up to 4 medications.  Patient has not spent $600 OOP on medications for 2022 and she states she cannot afford to. Therefore I don't think she will qualify for assistance for Trelegy but she does not have a Medicare Part D plan so that might help her to qualifybut might be able to get Breztri throught the [employer] program.    Osteopenia / hypocalcemia / hypoparathyroidism:  Managed by endocrinology - [name] with [facility] DEXA 05/21/2016: Femur Total Left T-score -2.1. AP Spine L1-L4 T-Score -1.9  Frax estimate: Major Osteoporotic Fracture: 8.4% / Hip  Fracture: 2.0% Patient is taking Tums / calcium carbonate 3 times a day due to hypercalcemia / hypoparathyroidism Also taking calcitriol 0.61mg - taking 2 capsules daily 6 days per week and none on Sundays (directions per patient from Dr PPosey Pronto   Patient Goals/Self-Care Activities Over the next 90 days, patient will:  take medications as prescribed, focus on medication adherence by using pill container if needed, and collaborate with provider on medication access solutions  Follow Up Plan: Telephone follow up appointment with care management team member scheduled for:  1 month     Medication Assistance: Application for Trelegy or Breztri  medication assistance program. in process.  Anticipated assistance start date 10/31/2020.  See plan of care for additional detail.  [facility] [facility] [place]  [place]Phone: [phone]: [phone]  Follow Up:  Patient agrees to Care Plan and Follow-up.  Plan: Telephone follow up appointment with care management team member scheduled for:  1 month  TCherre Robins PharmD Clinical Pharmacist [facility]

## 2020-10-02 NOTE — Patient Instructions (Signed)
Visit Information  PATIENT GOALS:  Goals Addressed             This Visit's Progress    Pharmacy Care Plan and Goals       Current Barriers:  Unable to independently afford treatment regimen Unable to achieve control of COPD  Unable to adhere to prescribed medication regimen  Pharmacist Clinical Goal(s):  Over the next 90 days, patient will verbalize ability to afford treatment regimen achieve control of COPD as evidenced by decreased exacerbations and increased use of maintenance inhaler maintain control of elevated cholesterol as evidenced by LDL <70  adhere to prescribed medication regimen as evidenced by refill history through collaboration with PharmD and provider.   Interventions: 1:1 collaboration with Shelda Pal, DO regarding development and update of comprehensive plan of care as evidenced by provider attestation and co-signature Inter-disciplinary care team collaboration (see longitudinal plan of care) Comprehensive medication review performed; medication list updated in electronic medical record  Hyperlipidemia: Not at goal; LDL goal <70 - last LDL was 77  Current treatment: atorvastatin 40mg  daily  Interventions:   Educated on LDL goals Recommended patient continue to take atorvastatin 40mg  daily  Recommended she limit intake of saturated and trans fat If LDL continues to be >70, consider increasing atorvastatin to 80mg  daily or switch to rosuvastatin 40mg  daily   Chronic Obstructive Pulmonary Disease: Uncontrolled but improving since started Trelegy:  Current treatment: Trelegy Inhaler 167mcg - inhale one puff into lungs once a day  Albuterol inhaler or nebulized solution Interventions: Educated on maintenance versus rescue inhalers Assessed patient finances. Planning help patient apply for patient assistance for Trelegy or if needed similar inhaler like Breztri as the Trelegy patient assistance program will likely require that she spend $600 out  of pocket on medications for 2022.   Osteopenia / hypocalcemia / hypoparathyroidism:  Managed by endocrinology - [name] with [place] [facility] Patient is taking Tums / calcium carbonate 3 times a day due to hypercalcemia / hypoparathyroidism Also taking calcitriol 0.50mcg - taking 2 capsules daily 6 days per week and none on Sundays   Patient Goals/Self-Care Activities Over the next 90 days, patient will:  take medications as prescribed, focus on medication adherence by using pill container if needed, and collaborate with provider on medication access solutions  Follow Up Plan: Telephone follow up appointment with care management team member scheduled for:  1 month          Patient verbalizes understanding of instructions provided today and agrees to view in [place].   Telephone follow up appointment with care management team member scheduled for: 1 months  [name], PharmD Clinical Pharmacist [facility]

## 2020-10-05 ENCOUNTER — Ambulatory Visit: Payer: Medicare Other | Admitting: Pharmacist

## 2020-10-05 DIAGNOSIS — J439 Emphysema, unspecified: Secondary | ICD-10-CM

## 2020-10-05 DIAGNOSIS — I700 Atherosclerosis of aorta: Secondary | ICD-10-CM

## 2020-10-06 NOTE — Chronic Care Management (AMB) (Signed)
Chronic Care Management Pharmacy Note  10/06/2020 Name:  Andrea Ward MRN:  [id number] DOB:  Aug 15, 1945   Subjective: Andrea Ward is an 75 y.o. year old female who is a primary patient of Shelda Pal, DO.  The CCM team was consulted for assistance with disease management and care coordination needs.    Engaged with patient by telephone for follow up visit in response to provider referral for pharmacy case management and/or care coordination services.   Consent to Services:  The patient was given information about Chronic Care Management services, agreed to services, and gave verbal consent prior to initiation of services.  Please see initial visit note for detailed documentation.   Patient Care Team: Shelda Pal, DO as PCP - General (Family Medicine) Buford Dresser, MD as PCP - Cardiology (Cardiology) Juanito Doom, MD as Consulting Physician (Pulmonary Disease) [name], PharmD (Pharmacist) Garner Nash, DO as Consulting Physician (Pulmonary Disease)  Recent office visits: 09/23/2020 - PCP - phone call - patient to pick up #4 samples of Trelegy 09/21/2020 - PCP (Dr Nani Ravens) F/U COPD. Prescribed prednisone 2m daily until she can get Trelegy. Prescribed meloxicam 7.565mdaily for osteoarthritis.  09/14/2020 - PCP (Dr WeNani RavensCOPD exacerbation; Rx prednisone 4063md for 5 days; albuterol inhaler or nebs as needed  Recent consult visits: 09/15/2020 - ophthalmology ([facility] - Dr. ForTrinna Postataract eval; no surgery for cataract removal at this time - monitoring;  09/13/2020 - Endo ([facility] - Dr PatPosey Pronto/U post-surgical hypoparathyroidism and hypothyroidism. Repeat TSH, T4, calcium and PTH; reflled calcitriol. Refill Synthroid.   [facility]: [facility] hypocal[facility] h/o hypoparathyroidism and COPD exacerbation. Ca+ was 5.6 at endo and instructed to go to ER.  New Meds  - restart calcitriol 0.5mc2maily. Meds discontinued at discharge: cholecalciferol 1000 units; Aspirin 81mg62morvastatin 40mg,56motidine 20mg,f71mus sulfate.    Objective:  Lab Results  Component Value Date   CREATININE 0.73 06/17/2020   CREATININE 0.78 02/10/2019   CREATININE 0.81 02/02/2019    No results found for: HGBA1C Last diabetic Eye exam: No results found for: HMDIABEYEEXA  Last diabetic Foot exam: No results found for: HMDIABFOOTEX      Component Value Date/Time   CHOL 139 06/17/2020 1158   TRIG 82.0 06/17/2020 1158   HDL 45.90 06/17/2020 1158   CHOLHDL 3 06/17/2020 1158   VLDL 16.4 06/17/2020 1158   LDLCALC 77 06/17/2020 1158    Hepatic Function Latest Ref Rng & Units 06/17/2020 02/10/2019 02/02/2019  Total Protein 6.0 - 8.3 g/dL 7.2 7.7 6.9  Albumin 3.5 - 5.2 g/dL 3.5 3.9 3.6  AST 0 - 37 U/L 12 15 16   ALT 0 - 35 U/L 5 5 5   Alk Phosphatase 39 - 117 U/L 67 62 57  Total Bilirubin 0.2 - 1.2 mg/dL 0.4 0.5 0.4  Bilirubin, Direct 0.1 - 0.5 mg/dL - - -    Lab Results  Component Value Date/Time   TSH 0.18 (L) 12/23/2019 09:07 AM   TSH 19.37 (H) 02/02/2019 02:05 PM   FREET4 1.5 12/23/2019 09:07 AM   FREET4 1.04 02/02/2019 02:05 PM    CBC Latest Ref Rng & Units 02/26/2019 02/10/2019 02/02/2019  WBC 4.0 - 10.5 K/uL 7.2 7.6 6.4  Hemoglobin 12.0 - 15.0 g/dL 10.8(L) 9.7(L) 9.8(L)  Hematocrit 36.0 - 46.0 % 34.7(L) 31.8(L) 30.5(L)  Platelets 150 - 400 K/uL 318 396 442.0(H)    No results found for: VD25OH  Clinical ASCVD: Yes  The 10-year ASCVD risk score Mikey Bussing DC Jr., et al., 2013) is: 24.1%   Values used to calculate the score:     Age: 35 years     Sex: Female     Is Non-Hispanic African American: Yes     Diabetic: Yes     Tobacco smoker: No     Systolic Blood Pressure: 580 mmHg     Is BP treated: Yes     HDL Cholesterol: 45.9 mg/dL     Total Cholesterol: 139 mg/dL     Social History   Tobacco Use  Smoking Status Former   Packs/day: 0.75   Years:  50.00   Pack years: 37.50   Types: Cigarettes   Start date: 03/19/1965   Quit date: 11/10/2018   Years since quitting: 1.9  Smokeless Tobacco Never  Tobacco Comments   stopped smoknig august/2020!   BP Readings from Last 3 Encounters:  09/21/20 138/76  09/14/20 (!) 160/80  06/17/20 138/80   Pulse Readings from Last 3 Encounters:  09/21/20 79  09/14/20 85  06/17/20 95   Wt Readings from Last 3 Encounters:  09/21/20 118 lb 4 oz (53.6 kg)  09/14/20 115 lb 8 oz (52.4 kg)  06/17/20 112 lb 2 oz (50.9 kg)    Assessment: Review of patient past medical history, allergies, medications, health status, including review of consultants reports, laboratory and other test data, was performed as part of comprehensive evaluation and provision of chronic care management services.   SDOH:  (Social Determinants of Health) assessments and interventions performed:     CCM Care Plan  No Known Allergies  Medications Reviewed Today     Reviewed by [name], PharmD (Pharmacist) on 09/29/20 at 60  Med List Status: <None>   Medication Order Taking? Sig Documenting Provider Last Dose Status Informant  albuterol (PROVENTIL) (2.5 MG/3ML) 0.083% nebulizer solution [id number] Yes Take 3 mLs (2.5 mg total) by nebulization every 6 (six) hours as needed for wheezing or shortness of breath. Shelda Pal, DO Taking Active   atorvastatin (LIPITOR) 40 MG tablet [id number] Yes Take 1 tablet (40 mg total) by mouth daily. Shelda Pal, DO Taking Active   busPIRone (BUSPAR) 15 MG tablet [id number] Yes TAKE 1 TABLET BY MOUTH THREE TIMES DAILY Shelda Pal, DO Taking Active   calcitRIOL (ROCALTROL) 0.5 MCG capsule [id number] Yes Take 2 tablets daily 6 days per week (none on Sundays) [provider] Taking Active   cholecalciferol (VITAMIN D3) 25 MCG (1000 UNIT) tablet [id number] Yes Take 1,000 Units by mouth daily. [provider] Taking Active   Cyanocobalamin  (VITAMIN B-12 PO) [id number] Yes Take 1,000 mcg by mouth daily. [provider] Taking Active   Fluticasone-Umeclidin-Vilant (TRELEGY ELLIPTA) 100-62.5-25 MCG/INH AEPB [id number] Yes Inhale 1 puff into the lungs daily. [provider] Taking Active   levothyroxine (SYNTHROID) 75 MCG tablet [id number] Yes Take 1 tablet (75 mcg total) by mouth daily before breakfast. Shelda Pal, DO Taking Active   meloxicam (MOBIC) 7.5 MG tablet [id number] Yes Take 1 tablet (7.5 mg total) by mouth daily. Shelda Pal, DO Taking Active   propranolol (INDERAL) 20 MG tablet [id number] Yes Take 1 tablet (20 mg total) by mouth 3 (three) times daily. Shelda Pal, DO Taking Active   VENTOLIN HFA 108 [phone] Base) MCG/ACT inhaler [id number] Yes Inhale 1-2 puffs into the lungs every 6 (six) hours as needed for wheezing or shortness of breath. Shelda Pal, DO Taking  Active             Patient Active Problem List   Diagnosis Date Noted   Aortic atherosclerosis ([place])    Bilateral carotid artery stenosis 08/27/2019   Coronary artery calcification seen on CT scan 08/27/2019   Idiopathic hypoparathyroidism ([place]) 02/02/2019   Ischemic colitis ([place]) 02/02/2019   Medication management 01/05/2019   Healthcare maintenance 12/17/2018   Former smoker 04/14/2018   Pulmonary emphysema ([place]) 01/09/2018   Tremor 08/19/2017   Tachycardia 07/10/2017   Hypothyroid    Hypothyroidism 06/12/2016   Pharyngeal dysphagia 06/12/2016   Quality of voice, hoarse 06/12/2016   Goiter 05/23/2016   GERD (gastroesophageal reflux disease) 05/23/2016   Generalized anxiety disorder 05/23/2016    Immunization History  Administered Date(s) Administered   Moderna Sars-Covid-2 Vaccination 07/09/2019, 08/06/2019   Pneumococcal Conjugate-13 05/23/2016   Pneumococcal Polysaccharide-23 07/10/2017   Tdap 05/23/2016    Conditions to be addressed/monitored: CAD, HLD, COPD, Anxiety, and  hypothyroidism; hypoparathyroidims; hypocalcemia; tremor; pain  Care Plan  Current Barriers:  Unable to independently afford treatment regimen Unable to achieve control of COPD  Does not adhere to prescribed medication regimen  Pharmacist Clinical Goal(s):  Over the next 90 days, patient will verbalize ability to afford treatment regimen achieve control of COPD as evidenced by decreased exacerbations and increased use of maintenance inhaler maintain control of HDL as evidenced by LDL <70  adhere to prescribed medication regimen as evidenced by refill history through collaboration with PharmD and provider.   Interventions: 1:1 collaboration with Shelda Pal, DO regarding development and update of comprehensive plan of care as evidenced by provider attestation and co-signature Inter-disciplinary care team collaboration (see longitudinal plan of care) Comprehensive medication review performed; medication list updated in electronic medical record  Hyperlipidemia: Not at goal; LDL goal <70 - last LDL was 77  Noted to have carotid artery stenosis, aortic atherosclerosis and positive coronary artery calcium on CT scan  Current treatment: atorvastatin 37m daily  Medications previously tried: none  Interventions:   Educated on LDL goals Recommended patient continue to take atorvastatin 462mdaily  Recommended she limit intake of saturated and trans fat If LDL continues to be >70, consider increasing atorvastatin to 8078maily or switch to rosuvastatin 59m24mily   Chronic Obstructive Pulmonary Disease: Uncontrolled but improving since started Trelegy:  Current treatment: Trelegy Inhaler 100mc103minhale one puff into lungs once a day Was unable to afford Trelegy so had not been taking. Dr WendlNani Ravenssecure 1 month of samples and patient has started. She reports breathing has greatly improved.   GOLD Classification: D MMRC/CAT score: 15 2 exacerbations requiring treatment in  the last 6 months  Interventions: Educated on maintenance versus rescue inhalers Assessed patient finances. She was [facility] for $[facility]. Was not familiar with this company. So I contact them [phone] they will assist patient in filling out applications for patient assistance and charge $35/month for 1 medication and  $49 for up to 4 medications.  Patient has decided not to use [employer] as it will cost her $49/month. Our office will assists with PAP applications instead.  Mailed applications for Trelegy (GSK) [place] as a back up if needed Breztri ([place]  Re-establish with pulmonology - has appt in August.   Osteopenia / hypocalcemia / hypoparathyroidism:  Managed by endocrinology - Dr [facility]y [facility]: Femur Total Left T-score -2.1. AP  Spine L1-L4 T-Score -1.9  Frax estimate: Major Osteoporotic Fracture: 8.4% / Hip Fracture: 2.0% Patient is taking Tums / calcium carbonate 3 times a day due to hypercalcemia / hypoparathyroidism Also taking calcitriol 0.8mg - taking 2 capsules daily 6 days per week and none on Sundays (directions per patient from Dr PPosey Pronto  Interventions: (addressed at previous visits)  Continue to follow with Dr PPosey Prontoregarding hypoparathyroidism and hypocalcemia Consider rechecking DEXA  Medication Management:  [place] [facility] She is using a discount coupon which helps keep most of her medications at $12 or less per 90 days except Trelegy.  Interventions:  Used Medicare.gov to complete Part D (drug benefit) coverage search. Identified 3 possible plans for patient. Printed information and mailed to her - included monthly premium, deductible and estimated medication [facility] medication as well as phone numbers for each plan so patient to contact and enroll if interested.   Patient  Goals/Self-Care Activities Over the next 90 days, patient will:  take medications as prescribed, focus on medication adherence by using pill container if needed, and collaborate with provider on medication access solutions  Follow Up Plan: Telephone follow up appointment with care management team member scheduled for:  1 month    Medication Assistance: Application for Trelegy or Breztri  medication assistance program. in process.  Anticipated assistance start date 10/31/2020.  See plan of care for additional detail.  [facility] [facility]  [place] [place]: [phone]: [phone]  Follow Up:  Patient agrees to Care Plan and Follow-up.  Plan: Telephone follow up appointment with care management team member scheduled for:  1 month  TCherre Robins PharmD Clinical Pharmacist [facility]

## 2020-10-06 NOTE — Patient Instructions (Signed)
Visit Information  PATIENT GOALS:  Goals Addressed             This Visit's Progress    Pharmacy Care Plan and Goals       Current Barriers:  Unable to independently afford treatment regimen Unable to achieve control of COPD  Unable to adhere to prescribed medication regimen  Pharmacist Clinical Goal(s):  Over the next 90 days, patient will verbalize ability to afford treatment regimen achieve control of COPD as evidenced by decreased exacerbations and increased use of maintenance inhaler maintain control of elevated cholesterol as evidenced by LDL <70  adhere to prescribed medication regimen as evidenced by refill history through collaboration with PharmD and provider.   Interventions: 1:1 collaboration with Shelda Pal, DO regarding development and update of comprehensive plan of care as evidenced by provider attestation and co-signature Inter-disciplinary care team collaboration (see longitudinal plan of care) Comprehensive medication review performed; medication list updated in electronic medical record  Hyperlipidemia: Not at goal; LDL goal <70 - last LDL was 77  Current treatment: atorvastatin 40mg  daily  Interventions:   Educated on LDL goals Recommended patient continue to take atorvastatin 40mg  daily  Recommended she limit intake of saturated and trans fat If LDL continues to be >70, consider increasing atorvastatin to 80mg  daily or switch to rosuvastatin 40mg  daily   Chronic Obstructive Pulmonary Disease: Uncontrolled but improving since started Trelegy:  Current treatment: Trelegy Inhaler 165mcg - inhale one puff into lungs once a day  Albuterol inhaler or nebulized solution Interventions: Educated on maintenance versus rescue inhalers Mailed applications for Trelegy / [place] patient assistance. Please complete patient portion and return to clinical pharmacist at Dr Irene Limbo office. I also included application for [place] as a back up if not  approved for Trelegy patient assistance.  Osteopenia / hypocalcemia / hypoparathyroidism:  Managed by endocrinology - [name] with [employer] [facility] Patient is taking Tums / calcium carbonate 3 times a day due to hypercalcemia / hypoparathyroidism Also taking calcitriol 0.31mcg - taking 2 capsules daily 6 days per week and none on Sundays   Medication Management:  [facility] Interventions:  Used Medicare.gov to complete Part D (drug benefit) coverage search. Identified 3 possible plans for patient. Printed information and mailed to her - included monthly premium, deductible and estimated medication costs at [facility] for her current medication as well as phone numbers for each plan so patient to contact and enroll if interested.   Patient Goals/Self-Care Activities Over the next 90 days, patient will:  take medications as prescribed, focus on medication adherence by using pill container if needed, and collaborate with provider on medication access solutions  Follow Up Plan: Telephone follow up appointment with care management team member scheduled for:  1 month          Patient verbalizes understanding of instructions provided today and agrees to view in [place].   Telephone follow up appointment with care management team member scheduled for: 1 month  [name], PharmD Clinical Pharmacist [place] [phone]

## 2020-10-21 ENCOUNTER — Telehealth: Payer: Self-pay | Admitting: Pharmacist

## 2020-10-21 ENCOUNTER — Telehealth: Payer: Medicare Other

## 2020-10-21 NOTE — Telephone Encounter (Signed)
Attempted to contact patient today x 2 for CCM follow up phone visit and to discuss patient assistance for Trelegy / inhalers  No answer but LM on VM with my contact number [phone]

## 2020-10-25 ENCOUNTER — Ambulatory Visit (INDEPENDENT_AMBULATORY_CARE_PROVIDER_SITE_OTHER): Payer: Medicare Other | Admitting: Pharmacist

## 2020-10-25 DIAGNOSIS — E039 Hypothyroidism, unspecified: Secondary | ICD-10-CM

## 2020-10-25 DIAGNOSIS — I2510 Atherosclerotic heart disease of native coronary artery without angina pectoris: Secondary | ICD-10-CM

## 2020-10-25 DIAGNOSIS — J439 Emphysema, unspecified: Secondary | ICD-10-CM | POA: Diagnosis not present

## 2020-10-25 DIAGNOSIS — E200 Idiopathic hypoparathyroidism: Secondary | ICD-10-CM

## 2020-10-25 DIAGNOSIS — I700 Atherosclerosis of aorta: Secondary | ICD-10-CM

## 2020-10-25 NOTE — Patient Instructions (Signed)
Visit Information  PATIENT GOALS:  Goals Addressed             This Visit's Progress    Pharmacy Care Plan and Goals       Current Barriers:  Unable to independently afford treatment regimen Unable to achieve control of COPD  Unable to adhere to prescribed medication regimen  Pharmacist Clinical Goal(s):  Over the next 90 days, patient will verbalize ability to afford treatment regimen achieve control of COPD as evidenced by decreased exacerbations and increased use of maintenance inhaler maintain control of elevated cholesterol as evidenced by LDL <70  adhere to prescribed medication regimen as evidenced by refill history through collaboration with PharmD and provider.   Interventions: 1:1 collaboration with Shelda Pal, DO regarding development and update of comprehensive plan of care as evidenced by provider attestation and co-signature Inter-disciplinary care team collaboration (see longitudinal plan of care) Comprehensive medication review performed; medication list updated in electronic medical record  Hyperlipidemia: Not at goal; LDL goal <70 - last LDL was 77  Current treatment: atorvastatin '40mg'$  daily  Interventions:   Educated on LDL goals Recommended patient continue to take atorvastatin '40mg'$  daily  Recommended she limit intake of saturated and trans fat If LDL continues to be >70, consider increasing atorvastatin to '80mg'$  daily or switch to rosuvastatin '40mg'$  daily   Chronic Obstructive Pulmonary Disease: Uncontrolled but improving since started Trelegy:  Current treatment: Trelegy Inhaler 119mg - inhale one puff into lungs once a day  Albuterol inhaler or nebulized solution use as needed up to every 6 hour for shortness of breath or wheezing.  Interventions: Reviewed maintenance versus rescue inhalers Check for mail today for patient assistance applications I sent. She will check today. Complete patient portion of application and mail back to me as  soon as possible as it might take up to 3 weeks to get approval and delivery of Trelegy Contact Charlis Harner at [phone] after checking mail today you have not received mailed applications. Re-establish with pulmonology - has appt in August.   Osteopenia / hypocalcemia / hypoparathyroidism / hypothyroidism / fatigue:  Managed by endocrinologist - Dr PPosey Pronto Current treatment:  Tums / calcium carbonate 3 times a day  Calcitriol 0.541m - taking 2 capsules daily  Levothyroxine 7511m- take 1 tablet daily 6 days per week, none on Sundays.  Interventions:  Continue to follow with Dr PatPosey Prontogarding - next appt 11/10/2020 Consider rechecking DEXA  Medication Management:  [facility] (medication coverage)  She is using a discount coupon which helps keep most of her medications at $12 or less per 90 days except Trelegy.  Interventions:  Used Medicare.gov to complete Part D (drug benefit) coverage search. Identified 3 possible plans for patient. Printed information and mailed to her - included monthly premium, deductible and [facility] medication as well as phone numbers for each plan so patient to contact and enroll if interested.   Patient Goals/Self-Care Activities Over the next 90 days, patient will:  take medications as prescribed,  Focus on medication adherence by using pill container if needed Collaborate with provider on medication access solutions  Follow Up Plan: Telephone follow up appointment with care management team member scheduled for:  2 weeks to check on PAP forms if not returned sooner.               The patient verbalized understanding of instructions, educational materials,  and care plan provided today and declined offer to receive copy of patient instructions, educational materials, and care plan.   Telephone follow up appointment with care management team member  scheduled for:  [name], PharmD Clinical Pharmacist [place] [facility]

## 2020-10-25 NOTE — Chronic Care Management (AMB) (Signed)
Chronic Care Management Pharmacy Note  10/25/2020 Name:  Andrea Ward MRN:  [id number] DOB:  20-Sep-1945   Subjective: Andrea Ward is an 75 y.o. year old female who is a primary patient of Shelda Pal, DO.  The CCM team was consulted for assistance with disease management and care coordination needs.    Engaged with patient by telephone for follow up visit in response to provider referral for pharmacy case management and/or care coordination services.   Consent to Services:  The patient was given information about Chronic Care Management services, agreed to services, and gave verbal consent prior to initiation of services.  Please see initial visit note for detailed documentation.   Patient Care Team: Shelda Pal, DO as PCP - General (Family Medicine) Buford Dresser, MD as PCP - Cardiology (Cardiology) Juanito Doom, MD as Consulting Physician (Pulmonary Disease) [name], PharmD (Pharmacist) Garner Nash, DO as Consulting Physician (Pulmonary Disease)  Recent office visits: 09/23/2020 - PCP - phone call - patient to pick up #4 samples of Trelegy 09/21/2020 - PCP (Dr Nani Ravens) F/U COPD. Prescribed prednisone 61m daily until she can get Trelegy. Prescribed meloxicam 7.537mdaily for osteoarthritis.  09/14/2020 - PCP (Dr WeNani RavensCOPD exacerbation; Rx prednisone 4049md for 5 days; albuterol inhaler or nebs as needed  Recent consult visits: 09/15/2020 - ophthalmology ([facility] - Dr. ForTrinna Postataract eval; no surgery for cataract removal at this time - monitoring;  09/13/2020 - Endo ([facility] - Dr PatPosey Pronto/U post-surgical hypoparathyroidism and hypothyroidism. Repeat TSH, T4, calcium and PTH; reflled calcitriol. Refill Synthroid.   [facility] hypocal[facility] h/o hypoparathyroidism and COPD exacerbation. Ca+ was 5.6 at endo and instructed to go to ER.  New Meds -  restart calcitriol 0.5mc32maily. Meds discontinued at discharge: cholecalciferol 1000 units; Aspirin 81mg20morvastatin 40mg,67motidine 20mg,f62mus sulfate.    Objective:  Lab Results  Component Value Date   CREATININE 0.73 06/17/2020   CREATININE 0.78 02/10/2019   CREATININE 0.81 02/02/2019    No results found for: HGBA1C Last diabetic Eye exam: No results found for: HMDIABEYEEXA  Last diabetic Foot exam: No results found for: HMDIABFOOTEX      Component Value Date/Time   CHOL 139 06/17/2020 1158   TRIG 82.0 06/17/2020 1158   HDL 45.90 06/17/2020 1158   CHOLHDL 3 06/17/2020 1158   VLDL 16.4 06/17/2020 1158   LDLCALC 77 06/17/2020 1158    Hepatic Function Latest Ref Rng & Units 06/17/2020 02/10/2019 02/02/2019  Total Protein 6.0 - 8.3 g/dL 7.2 7.7 6.9  Albumin 3.5 - 5.2 g/dL 3.5 3.9 3.6  AST 0 - 37 U/L _0 ALT 0 - 35 U/L _1 Alk Phosphatase 39 - 117 U/L 67 62 57  Total Bilirubin 0.2 - 1.2 mg/dL 0.4 0.5 0.4  Bilirubin, Direct 0.1 - 0.5 mg/dL - - -    Lab Results  Component Value Date/Time   TSH 0.18 (L) 12/23/2019 09:07 AM   TSH 19.37 (H) 02/02/2019 02:05 PM   FREET4 1.5 12/23/2019 09:07 AM   FREET4 1.04 02/02/2019 02:05 PM    CBC Latest Ref Rng & Units 02/26/2019 02/10/2019 02/02/2019  WBC 4.0 - 10.5 K/uL 7.2 7.6 6.4  Hemoglobin 12.0 - 15.0 g/dL 10.8(L) 9.7(L) 9.8(L)  Hematocrit 36.0 - 46.0 % 34.7(L) 31.8(L) 30.5(L)  Platelets 150 - 400 K/uL 318 396 442.0(H)    No results found for: VD25OH  Clinical ASCVD: Yes  The 10-year ASCVD risk score Mikey Bussing DC Jr., et al., 2013) is: 24.1%   Values used to calculate the score:     Age: 68 years     Sex: Female     Is Non-Hispanic African American: Yes     Diabetic: Yes     Tobacco smoker: No     Systolic Blood Pressure: 157 mmHg     Is BP treated: Yes     HDL Cholesterol: 45.9 mg/dL     Total Cholesterol: 139 mg/dL     Social History   Tobacco Use  Smoking Status Former   Packs/day: 0.75   Years:  50.00   Pack years: 37.50   Types: Cigarettes   Start date: 03/19/1965   Quit date: 11/10/2018   Years since quitting: 1.9  Smokeless Tobacco Never  Tobacco Comments   stopped smoknig august/2020!   BP Readings from Last 3 Encounters:  09/21/20 138/76  09/14/20 (!) 160/80  06/17/20 138/80   Pulse Readings from Last 3 Encounters:  09/21/20 79  09/14/20 85  06/17/20 95   Wt Readings from Last 3 Encounters:  09/21/20 118 lb 4 oz (53.6 kg)  09/14/20 115 lb 8 oz (52.4 kg)  06/17/20 112 lb 2 oz (50.9 kg)    Assessment: Review of patient past medical history, allergies, medications, health status, including review of consultants reports, laboratory and other test data, was performed as part of comprehensive evaluation and provision of chronic care management services.   SDOH:  (Social Determinants of Health) assessments and interventions performed:  SDOH Interventions    Flowsheet Row Most Recent Value  SDOH Interventions   Financial Strain Interventions Other (Comment)  [assisting with applying for PAP for Trelegy thru [place] [facility].]        [place]  No Known Allergies  Medications Reviewed Today     Reviewed by [name], PharmD (Pharmacist) on 10/25/20 at 0950  Med List Status: <None>   Medication Order Taking? Sig Documenting Provider Last Dose Status Informant  albuterol (PROVENTIL) (2.5 MG/3ML) 0.083% nebulizer solution [id number] Yes Take 3 mLs (2.5 mg total) by nebulization every 6 (six) hours as needed for wheezing or shortness of breath. Shelda Pal, DO Taking Active   atorvastatin (LIPITOR) 40 MG tablet [id number] Yes Take 1 tablet (40 mg total) by mouth daily. Shelda Pal, DO Taking Active   busPIRone (BUSPAR) 15 MG tablet [id number] Yes TAKE 1 TABLET BY MOUTH THREE TIMES DAILY Shelda Pal, DO Taking Active   calcitRIOL (ROCALTROL) 0.5 MCG capsule [id number] Yes Take 2 tablets daily 6 days per week (none on Sundays) [provider] Taking Active   Calcium Carbonate Antacid (TUMS PO) [id number] Yes Take 1,000 mg by mouth 3 (three) times daily. [provider] Taking Active   cholecalciferol (VITAMIN D3) 25 MCG (1000 UNIT) tablet [id number] Yes Take 1,000 Units by mouth daily. [provider] Taking Active   Fluticasone-Umeclidin-Vilant (TRELEGY ELLIPTA) 100-62.5-25 MCG/INH AEPB [id number] Yes Inhale 1 puff into the lungs daily. [provider] Taking Active   guaifenesin (HUMIBID E) 400 MG TABS tablet [id number] Yes Take 400 mg by mouth every 6 (six) hours as needed. [provider] Taking Active   levothyroxine (SYNTHROID) 75 MCG tablet [id number] Yes Take 1 tablet (75 mcg total) by mouth daily before breakfast. Shelda Pal, DO Taking Active   meloxicam (MOBIC) 7.5 MG tablet [id number] Yes Take 1 tablet (7.5 mg total) by mouth daily. Nani Ravens,  [place], DO Taking Active   propranolol (INDERAL) 20 MG tablet [id number] Yes Take 1 tablet (20 mg total) by mouth 3 (three) times daily. Shelda Pal, DO Taking Active   VENTOLIN HFA 108 [phone] Base) MCG/ACT inhaler [id number] Yes Inhale 1-2 puffs into the lungs every 6 (six) hours as needed for wheezing or shortness of breath. Shelda Pal, DO Taking Active             Patient Active Problem List   Diagnosis Date Noted   Aortic atherosclerosis ([place])    Bilateral carotid artery stenosis 08/27/2019   Coronary artery calcification seen on CT scan 08/27/2019   Idiopathic hypoparathyroidism ([place]) 02/02/2019   Ischemic colitis ([place]) 02/02/2019   Medication management 01/05/2019   Healthcare maintenance 12/17/2018   Former smoker 04/14/2018   Pulmonary emphysema ([place]) 01/09/2018   Tremor 08/19/2017   Tachycardia 07/10/2017   Hypothyroid    Hypothyroidism 06/12/2016   Pharyngeal dysphagia 06/12/2016   Quality of voice, hoarse 06/12/2016   Goiter 05/23/2016   GERD (gastroesophageal reflux disease)  05/23/2016   Generalized anxiety disorder 05/23/2016    Immunization History  Administered Date(s) Administered   Moderna Sars-Covid-2 Vaccination 07/09/2019, 08/06/2019   Pneumococcal Conjugate-13 05/23/2016   Pneumococcal Polysaccharide-23 07/10/2017   Tdap 05/23/2016    Conditions to be addressed/monitored: CAD, HLD, COPD, Anxiety, and hypothyroidism; hypoparathyroidims; hypocalcemia; tremor; pain  Care Plan  Current Barriers:  Unable to independently afford treatment regimen Unable to achieve control of COPD  Does not adhere to prescribed medication regimen  Pharmacist Clinical Goal(s):  Over the next 90 days, patient will verbalize ability to afford treatment regimen achieve control of COPD as evidenced by decreased exacerbations and increased use of maintenance inhaler maintain control of HDL as evidenced by LDL <70  adhere to prescribed medication regimen as evidenced by refill history through collaboration with PharmD and provider.   Interventions: 1:1 collaboration with Shelda Pal, DO regarding development and update of comprehensive plan of care as evidenced by provider attestation and co-signature Inter-disciplinary care team collaboration (see longitudinal plan of care) Comprehensive medication review performed; medication list updated in electronic medical record  Hyperlipidemia: Not at goal; LDL goal <70 - last LDL was 77  Noted to have carotid artery stenosis, aortic atherosclerosis and positive coronary artery calcium on CT scan  Current treatment: atorvastatin 31m daily  Medications previously tried: none  Interventions:   Educated on LDL goals Recommended patient continue to take atorvastatin 475mdaily  Recommended she limit intake of saturated and trans fat If LDL continues to be >70, consider increasing atorvastatin to 8047maily or switch to rosuvastatin 22m9mily   Chronic Obstructive Pulmonary Disease: Uncontrolled but improving  since started Trelegy:  Current treatment: Trelegy Inhaler 100mc51minhale one puff into lungs once a day Was unable to afford Trelegy so had not been taking. Dr WendlNani Ravenssecure 1 month of samples and patient has started. She reports breathing has greatly improved.   GOLD Classification: D MMRC/CAT score: 15 2 exacerbations requiring treatment in the last 6 months  Interventions: Educated on maintenance versus rescue inhalers Assessed patient finances. [facility]. Was not familiar with this company. So I contact them [phone] they will assist patient in filling out applications for patient assistance and charge $35/month for 1 medication and  $49 for up to 4 medications.  Patient has decided not to use [employer]  as it will cost her $49/month. Our office will assists with PAP applications instead.  Mailed applications for Trelegy ([place]) and as a back up if needed Breztri ([place])  Re-establish with pulmonology - has appt in August.   Osteopenia / hypocalcemia / hypoparathyroidism:  Managed by endocrinology - [name] with [facility] DEXA 05/21/2016: Femur Total Left T-score -2.1. AP Spine L1-L4 T-Score -1.9  Frax estimate: Major Osteoporotic Fracture: 8.4% / Hip Fracture: 2.0% Patient is taking Tums / calcium carbonate 3 times a day due to hypercalcemia / hypoparathyroidism Also taking calcitriol 0.64mg - taking 2 capsules daily 6 days per week and none on Sundays (directions per patient from Dr PPosey Pronto  Interventions: (addressed at previous visits)  Continue to follow with Dr PPosey Prontoregarding hypoparathyroidism and hypocalcemia Consider rechecking DEXA  Medication Management:  [facility] [place] [facility] - confirmed with [facility] She is using a discount coupon which helps keep most of her medications at $12 or less per 90 days except Trelegy.  Interventions:   Used Medicare.gov to complete Part D (drug benefit) coverage search. Identified 3 possible plans for patient. Printed information and mailed to her - included monthly premium, deductible and [facility] as well as phone numbers for each plan so patient to contact and enroll if interested.   Patient Goals/Self-Care Activities Over the next 90 days, patient will:  take medications as prescribed, focus on medication adherence by using pill container if needed, and collaborate with provider on medication access solutions  Follow Up Plan: Telephone follow up appointment with care management team member scheduled for:  1 month    Medication Assistance: Application for Trelegy or Breztri  medication assistance program mailed to patient 2 weeks ago but she states she has not received yet.  She will check mail (hasn't checked in several days) and notify clinical pharmacist if she does not have in mail today. If she does have in mailbox she will complete and return to office ASAP.  Anticipated assistance start date 11/15/2020.  See plan of care for additional detail.  [facility]  [place] [id number]: [phone]: [phone]  Follow Up:  Patient agrees to Care Plan and Follow-up.  Plan: Telephone follow up appointment with care management team member scheduled for:  2 weeks to check PAP application  TCherre Robins PharmD Clinical Pharmacist [facility]

## 2020-10-26 ENCOUNTER — Telehealth: Payer: Self-pay | Admitting: Pharmacist

## 2020-10-26 DIAGNOSIS — M1991 Primary osteoarthritis, unspecified site: Secondary | ICD-10-CM

## 2020-10-26 MED ORDER — MELOXICAM 7.5 MG PO TABS: 7.5000 mg | ORAL_TABLET | Freq: Every day | ORAL | 0 refills | Status: DC

## 2020-10-26 NOTE — Telephone Encounter (Signed)
Refill sent in

## 2020-10-26 NOTE — Telephone Encounter (Signed)
Requesting refill on meloxicam 7.'5mg'$  Last Rx #30 / no Refills from 09/21/2020 Patient has 7 tablets left.  Send to [place]

## 2020-11-10 ENCOUNTER — Ambulatory Visit (HOSPITAL_BASED_OUTPATIENT_CLINIC_OR_DEPARTMENT_OTHER): Payer: Medicare Other | Admitting: Cardiology

## 2020-11-10 ENCOUNTER — Ambulatory Visit: Payer: Medicare Other | Admitting: Pulmonary Disease

## 2020-11-10 DIAGNOSIS — K31811 Angiodysplasia of stomach and duodenum with bleeding: Secondary | ICD-10-CM | POA: Diagnosis not present

## 2020-11-10 DIAGNOSIS — J9811 Atelectasis: Secondary | ICD-10-CM | POA: Diagnosis not present

## 2020-11-10 DIAGNOSIS — D649 Anemia, unspecified: Secondary | ICD-10-CM | POA: Diagnosis not present

## 2020-11-10 DIAGNOSIS — K219 Gastro-esophageal reflux disease without esophagitis: Secondary | ICD-10-CM | POA: Diagnosis present

## 2020-11-10 DIAGNOSIS — R1314 Dysphagia, pharyngoesophageal phase: Secondary | ICD-10-CM | POA: Diagnosis not present

## 2020-11-10 DIAGNOSIS — R1310 Dysphagia, unspecified: Secondary | ICD-10-CM | POA: Diagnosis present

## 2020-11-10 DIAGNOSIS — Z20822 Contact with and (suspected) exposure to covid-19: Secondary | ICD-10-CM | POA: Diagnosis not present

## 2020-11-10 DIAGNOSIS — F17200 Nicotine dependence, unspecified, uncomplicated: Secondary | ICD-10-CM | POA: Diagnosis not present

## 2020-11-10 DIAGNOSIS — R946 Abnormal results of thyroid function studies: Secondary | ICD-10-CM | POA: Diagnosis present

## 2020-11-10 DIAGNOSIS — J439 Emphysema, unspecified: Secondary | ICD-10-CM | POA: Diagnosis not present

## 2020-11-10 DIAGNOSIS — E876 Hypokalemia: Secondary | ICD-10-CM | POA: Diagnosis not present

## 2020-11-10 DIAGNOSIS — R195 Other fecal abnormalities: Secondary | ICD-10-CM | POA: Diagnosis not present

## 2020-11-10 DIAGNOSIS — R0602 Shortness of breath: Secondary | ICD-10-CM | POA: Diagnosis not present

## 2020-11-10 DIAGNOSIS — J90 Pleural effusion, not elsewhere classified: Secondary | ICD-10-CM | POA: Diagnosis not present

## 2020-11-10 DIAGNOSIS — K222 Esophageal obstruction: Secondary | ICD-10-CM | POA: Diagnosis not present

## 2020-11-10 DIAGNOSIS — E890 Postprocedural hypothyroidism: Secondary | ICD-10-CM | POA: Diagnosis not present

## 2020-11-10 DIAGNOSIS — F17210 Nicotine dependence, cigarettes, uncomplicated: Secondary | ICD-10-CM | POA: Diagnosis not present

## 2020-11-10 DIAGNOSIS — E039 Hypothyroidism, unspecified: Secondary | ICD-10-CM | POA: Diagnosis not present

## 2020-11-10 DIAGNOSIS — D509 Iron deficiency anemia, unspecified: Secondary | ICD-10-CM | POA: Diagnosis not present

## 2020-11-10 DIAGNOSIS — I517 Cardiomegaly: Secondary | ICD-10-CM | POA: Diagnosis not present

## 2020-11-10 DIAGNOSIS — J449 Chronic obstructive pulmonary disease, unspecified: Secondary | ICD-10-CM | POA: Diagnosis not present

## 2020-11-10 DIAGNOSIS — D62 Acute posthemorrhagic anemia: Secondary | ICD-10-CM | POA: Diagnosis not present

## 2020-11-10 DIAGNOSIS — K5521 Angiodysplasia of colon with hemorrhage: Secondary | ICD-10-CM | POA: Diagnosis not present

## 2020-11-10 DIAGNOSIS — D500 Iron deficiency anemia secondary to blood loss (chronic): Secondary | ICD-10-CM | POA: Diagnosis not present

## 2020-11-10 DIAGNOSIS — J9601 Acute respiratory failure with hypoxia: Secondary | ICD-10-CM | POA: Diagnosis not present

## 2020-11-11 ENCOUNTER — Telehealth: Payer: Medicare Other

## 2020-11-17 ENCOUNTER — Ambulatory Visit (INDEPENDENT_AMBULATORY_CARE_PROVIDER_SITE_OTHER): Payer: Medicare Other | Admitting: Pharmacist

## 2020-11-17 DIAGNOSIS — J441 Chronic obstructive pulmonary disease with (acute) exacerbation: Secondary | ICD-10-CM

## 2020-11-17 DIAGNOSIS — E785 Hyperlipidemia, unspecified: Secondary | ICD-10-CM

## 2020-11-17 DIAGNOSIS — I2510 Atherosclerotic heart disease of native coronary artery without angina pectoris: Secondary | ICD-10-CM

## 2020-11-17 DIAGNOSIS — I700 Atherosclerosis of aorta: Secondary | ICD-10-CM

## 2020-11-17 NOTE — Chronic Care Management (AMB) (Signed)
Chronic Care Management Pharmacy Note  11/23/2020 Name:  Andrea Ward MRN:  [id number] DOB:  1946/03/17  Summary: Recent hospital admission with anemia related to blood loss and many electrolyte abnormalities.  COPD - patient has not completed and returned patient assistance for Trelegy.  States she has not received applications that I have mailed to her twice.  Recommendations/Changes made from today's visit: Assisted patient with setting up hospital follow up appointments for PCP, Pulmonologist and Endocrinologist.  Filled out PAP applications for Trelegy again. Patient will sign when in office to see PCP 11/25/2020  Subjective: Andrea Ward is an 75 y.o. year old female who is a primary patient of Shelda Pal, DO.  The CCM team was consulted for assistance with disease management and care coordination needs.    Engaged with patient by telephone for follow up visit in response to provider referral for pharmacy case management and/or care coordination services.   Consent to Services:  The patient was given information about Chronic Care Management services, agreed to services, and gave verbal consent prior to initiation of services.  Please see initial visit note for detailed documentation.   Patient Care Team: Shelda Pal, DO as PCP - General (Family Medicine) Buford Dresser, MD as PCP - Cardiology (Cardiology) Juanito Doom, MD as Consulting Physician (Pulmonary Disease) [name], PharmD (Pharmacist) Garner Nash, DO as Consulting Physician (Pulmonary Disease)  Recent office visits: 09/23/2020 - PCP - phone call - patient to pick up #4 samples of Trelegy 09/21/2020 - PCP (Dr Nani Ravens) F/U COPD. Prescribed prednisone 17m daily until she can get Trelegy. Prescribed meloxicam 7.588mdaily for osteoarthritis.  09/14/2020 - PCP (Dr WeNani RavensCOPD exacerbation; Rx prednisone 4047md for 5 days; albuterol inhaler or nebs as  needed  Recent consult visits: 09/15/2020 - ophthalmology ([facility] - Dr. ForTrinna Postataract eval; no surgery for cataract removal at this time - monitoring;  09/13/2020 - Endo ([facility] - Dr PatPosey Pronto/U post-surgical hypoparathyroidism and hypothyroidism. Repeat TSH, T4, calcium and PTH; reflled calcitriol. Refill Synthroid.   Hospital visits: 11/10/2020 to 11/14/2020 [facility], servere anemia. Also has low magnesium, potassium and calcium.  Medications started at discharge: oral iron supplement 150m73mice a day (Nu-Iron)  potassium supplement 20mE43mce a day for 14 days acetaminophen 325mg 10mke 2 tablets = 650mg e33m 6 hours as needed Medication changes at discharge: increased magnesium oxide to 400mg tw18ma day  [facility] [place] h/o hypoparathyroidism and COPD exacerbation. Ca+ was 5.6 at endo and instructed to go to ER.  New Meds - restart calcitriol 0.5mcg dai37m Meds discontinued at discharge: cholecalciferol 1000 units; Aspirin 81mg, ato58mtatin 40mg, famo71mne 20mg,ferrou69mlfate.   Objective:  Lab Results  Component Value Date   CREATININE 0.73 06/17/2020   CREATININE 0.78 02/10/2019   CREATININE 0.81 02/02/2019    No results found for: HGBA1C Last diabetic Eye exam: No results found for: HMDIABEYEEXA  Last diabetic Foot exam: No results found for: HMDIABFOOTEX      Component Value Date/Time   CHOL 139 06/17/2020 1158   TRIG 82.0 06/17/2020 1158   HDL 45.90 06/17/2020 1158   CHOLHDL 3 06/17/2020 1158   VLDL 16.4 06/17/2020 1158   LDLCALC 77 06/17/2020 1158    Hepatic Function Latest Ref Rng & Units 06/17/2020 02/10/2019 02/02/2019  Total Protein 6.0 - 8.3 g/dL 7.2 7.7 6.9  Albumin  3.5 - 5.2 g/dL 3.5 3.9 3.6  AST 0 - 37 U/L _0 ALT 0 - 35 U/L _1 Alk Phosphatase 39 - 117 U/L 67 62 57  Total Bilirubin 0.2 - 1.2 mg/dL 0.4 0.5 0.4   Bilirubin, Direct 0.1 - 0.5 mg/dL - - -    Lab Results  Component Value Date/Time   TSH 0.18 (L) 12/23/2019 09:07 AM   TSH 19.37 (H) 02/02/2019 02:05 PM   FREET4 1.5 12/23/2019 09:07 AM   FREET4 1.04 02/02/2019 02:05 PM    CBC Latest Ref Rng & Units 02/26/2019 02/10/2019 02/02/2019  WBC 4.0 - 10.5 K/uL 7.2 7.6 6.4  Hemoglobin 12.0 - 15.0 g/dL 10.8(L) 9.7(L) 9.8(L)  Hematocrit 36.0 - 46.0 % 34.7(L) 31.8(L) 30.5(L)  Platelets 150 - 400 K/uL 318 396 442.0(H)    No results found for: VD25OH  Clinical ASCVD: Yes  The 10-year ASCVD risk score Mikey Bussing DC Jr., et al., 2013) is: 26.4%   Values used to calculate the score:     Age: 89 years     Sex: Female     Is Non-Hispanic African American: Yes     Diabetic: Yes     Tobacco smoker: No     Systolic Blood Pressure: 496 mmHg     Is BP treated: Yes     HDL Cholesterol: 45.9 mg/dL     Total Cholesterol: 139 mg/dL     Social History   Tobacco Use  Smoking Status Former   Packs/day: 0.75   Years: 50.00   Pack years: 37.50   Types: Cigarettes   Start date: 03/19/1965   Quit date: 11/10/2018   Years since quitting: 2.0  Smokeless Tobacco Never  Tobacco Comments   stopped smoknig august/2020!   BP Readings from Last 3 Encounters:  09/21/20 138/76  09/14/20 (!) 160/80  06/17/20 138/80   Pulse Readings from Last 3 Encounters:  09/21/20 79  09/14/20 85  06/17/20 95   Wt Readings from Last 3 Encounters:  09/21/20 118 lb 4 oz (53.6 kg)  09/14/20 115 lb 8 oz (52.4 kg)  06/17/20 112 lb 2 oz (50.9 kg)    Assessment: Review of patient past medical history, allergies, medications, health status, including review of consultants reports, laboratory and other test data, was performed as part of comprehensive evaluation and provision of chronic care management services.   SDOH:  (Social Determinants of Health) assessments and interventions performed:    CCM Care Plan  No Known Allergies  Medications Reviewed Today      Reviewed by [name], PharmD (Pharmacist) on 11/23/20 at [place] List Status: <None>   Medication Order Taking? Sig Documenting Provider Last Dose Status Informant  acetaminophen (TYLENOL) 325 MG tablet [id number] Yes Take 2 tablets by mouth every 6 (six) hours as needed. [provider] Taking Active   albuterol (PROVENTIL) (2.5 MG/3ML) 0.083% nebulizer solution [id number] Yes Take 3 mLs (2.5 mg total) by nebulization every 6 (six) hours as needed for wheezing or shortness of breath. Shelda Pal, DO Taking Active   atorvastatin (LIPITOR) 40 MG tablet [id number] Yes Take 1 tablet (40 mg total) by mouth daily. Shelda Pal, DO Taking Active   busPIRone (BUSPAR) 15 MG tablet [id number] Yes TAKE 1 TABLET BY MOUTH THREE TIMES DAILY Nani Ravens Crosby Oyster, DO Taking Active   calcitRIOL (ROCALTROL) 0.5 MCG capsule [id number] Yes Take 1 mcg by mouth daily. [provider] Taking Active   Calcium Carbonate Antacid (  TUMS PO) [id number] Yes Take 1,000 mg by mouth 3 (three) times daily. [provider] Taking Active   cholecalciferol (VITAMIN D3) 25 MCG (1000 UNIT) tablet [id number] Yes Take 1,000 Units by mouth daily. [provider] Taking Active   Fluticasone-Umeclidin-Vilant (TRELEGY ELLIPTA) 100-62.5-25 MCG/INH AEPB [id number] Yes Inhale 1 puff into the lungs daily. [provider] Taking Active   guaifenesin (HUMIBID E) 400 MG TABS tablet [id number] Yes Take 400 mg by mouth every 6 (six) hours as needed. [provider] Taking Active   iron polysaccharides (NIFEREX) 150 MG capsule [id number] Yes Take 1 capsule by mouth in the morning and at bedtime. [provider] Taking Active   levothyroxine (SYNTHROID) 75 MCG tablet [id number] Yes Take 1 tablet (75 mcg total) by mouth daily before breakfast. Shelda Pal, DO Taking Active            Med Note Antony Contras, Myka Hitz B   Tue Oct 25, 2020 11:22 AM) Take 1 tablet 6 days per  week and none on Sundays.   magnesium oxide (MAG-OX) 400 (240 Mg) MG tablet [id number] Yes Take 1 tablet by mouth 2 (two) times daily. [provider] Taking Active   meloxicam (MOBIC) 7.5 MG tablet [id number] Yes Take 1 tablet (7.5 mg total) by mouth daily. Shelda Pal, DO Taking Active            Med Note Antony Contras, Javiel Canepa B   Thu Nov 17, 2020  3:38 PM) 11/17/2020 patient told to hold until sees gastroenterologist  potassium chloride SA (KLOR-CON) 20 MEQ tablet [id number] Yes Take 1 tablet by mouth daily. [provider] Taking Active   propranolol (INDERAL) 20 MG tablet [id number] Yes Take 1 tablet (20 mg total) by mouth 3 (three) times daily. Shelda Pal, DO Taking Active   VENTOLIN HFA 108 [phone] Base) MCG/ACT inhaler [id number] Yes Inhale 1-2 puffs into the lungs every 6 (six) hours as needed for wheezing or shortness of breath. Shelda Pal, DO Taking Active             Patient Active Problem List   Diagnosis Date Noted   Aortic atherosclerosis ([place])    Bilateral carotid artery stenosis 08/27/2019   Coronary artery calcification seen on CT scan 08/27/2019   Idiopathic hypoparathyroidism ([place]) 02/02/2019   Ischemic colitis ([place]) 02/02/2019   Medication management 01/05/2019   Healthcare maintenance 12/17/2018   Former smoker 04/14/2018   Pulmonary emphysema ([place]) 01/09/2018   Tremor 08/19/2017   Tachycardia 07/10/2017   Hypothyroid    Hypothyroidism 06/12/2016   Pharyngeal dysphagia 06/12/2016   Quality of voice, hoarse 06/12/2016   Goiter 05/23/2016   GERD (gastroesophageal reflux disease) 05/23/2016   Generalized anxiety disorder 05/23/2016    Immunization History  Administered Date(s) Administered   Moderna Sars-Covid-2 Vaccination 07/09/2019, 08/06/2019   Pneumococcal Conjugate-13 05/23/2016   Pneumococcal Polysaccharide-23 07/10/2017   Tdap 05/23/2016    Conditions to be addressed/monitored: CAD, HTN, HLD, COPD, and  hypothyroidism; hypoparathyroidism, low calcium; anemia; AVMs; anxiety; emphysema / COPD, GAD< joint pain, GERD  Care Plan : General Pharmacy (Adult)  Updates made by [name], PHARMD since 11/23/2020 12:00 AM     Problem: Emphysema / COPD; HDL; CAD / coronary artery calcium; hypothyroidism; hypoparathyrroidism; tremor; GAD; GERD; joint pain   Priority: High     Long-Range Goal: Pharmacy goals related to chronic care and medication management   Start Date: 09/29/2020  Priority: High  Note:   Current Barriers:  Unable to independently afford  treatment regimen Unable to achieve control of COPD  Does not adhere to prescribed medication regimen Recent hospitalization with medication changes. Patient education needed  Pharmacist Clinical Goal(s):  Over the next 90 days, patient will verbalize ability to afford treatment regimen achieve control of COPD as evidenced by decreased exacerbations and increased use of maintenance inhaler maintain control of HDL as evidenced by LDL <70  adhere to prescribed medication regimen as evidenced by refill history  through collaboration with PharmD and provider.   Interventions: 1:1 collaboration with Shelda Pal, DO regarding development and update of comprehensive plan of care as evidenced by provider attestation and co-signature Inter-disciplinary care team collaboration (see longitudinal plan of care) Comprehensive medication review performed; medication list updated in electronic medical record  Hyperlipidemia: Not at goal; LDL goal <70 - last LDL was 77  Noted to have carotid artery stenosis, aortic atherosclerosis and positive coronary artery calcium on CT scan  Current treatment: atorvastatin 60m daily  Medications previously tried: none  Interventions: (addressed at previous visit)  Educated on LDL goals Recommended patient continue to take atorvastatin 485mdaily  Recommended she limit intake of saturated and trans fat If  LDL continues to be >70, consider increasing atorvastatin to 8027maily or switch to rosuvastatin 68m63mily   Chronic Obstructive Pulmonary Disease: Uncontrolled but improving since started Trelegy.  Goal: reduce shortness of breath / wheezing and prevent exacerbations Current treatment:  Trelegy Inhaler 100mc17minhale one puff into lungs once a day Albuterol inhaler or nebs - use up to every 6 hours as needed for shortness of breath / wheezine.  In July 2022 patient was unable to afford Trelegy so had not been taking. Dr WendlNani Ravenssecure 1-2 months of samples and patient has started. She reports breathing has greatly improved.  She reports today that she has 21 days of Trelegy samples remaining We have mailed applications for patient assistance for Trelelgy twice. Today patient states she still has not received applications. I again completed provider portion of application. Patient will be in office 11/25/20 and will have her sign and review at that appointment.  Patient was supposed to have appointment with Dr IkardWindell Norfolklmonologist on day that she was recently admitted to hospital for severe anemia / blood loss.  Use of maintenance inhaler: patient endorses she is using every day Use of rescue inhaler: once per week or less GOLD Classification: D MMRC/CAT score: 18 1 exacerbations requiring treatment in the last 6 months  Interventions: Reviewed maintenance versus rescue inhalers Again completed applications for Trelegy and will have patient sign 11/25/2020 Re-establish with pulmonology -  ([facility] IkardWindell [place] [facility]  Anemia due to blood loss:  Recent hospitalization with HBG of 4.1 on admission. 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  Patient is still taking meloxicam though it appears she was to stop all NSAIDS at discharge and take acetaminophen 650mg 7mo every 6 hours as needed instead.   Interventions: Patient instructed to hold meloxicam until GI follow up Noted that patient has referral pending for GI follow up  Osteopenia / hypocalcemia / hypoparathyroidism / hypothyroidism / fatigue:  [facility] states she continue to have fatigue - could be related to thyroid function - levothyroxine dose was lowered in June 2022 due to low TSH.   Last serum calcium was improved but still low. Also noted that last HGB  was low at 8.0 during hospitalization in March 2022. If still low could be reason for fatigue DEXA 05/21/2016: Femur Total Left T-score -2.1. AP Spine L1-L4 T-Score -1.9  Frax estimate: Major Osteoporotic Fracture: 8.4% / Hip Fracture: 2.0% Current treatment:   Tums / calcium carbonate 3 times a day due to hypercalcemia / hypoparathyroidism  calcitriol 0.46mg - taking 2 capsules daily  Levothyroxine 761m - take 1 tablet daily 6 days per week, none on Sundays.  Interventions: (addressed at previous visits)  Continue to follow with Dr PaPosey Prontoegarding hypoparathyroidism and hypocalcemia.  Assisted patient in making follow up appointment with Dr PaPosey Prontoonsider rechecking DEXA  Medication Management:  [facility] [place] - confirmed with [facility] She is using a discount coupon which helps keep most of her medications at $12 or less per 90 days except Trelegy.  Interventions: (addressed at previous appointment)  Used Medicare.gov to complete Part D (drug benefit) coverage search. Identified 3 possible plans for patient. Printed information and mailed to her - included monthly premium, deductible and estimated medication costs [facility] current medication as well as phone numbers for each plan so patient to contact and enroll if interested.   The following appointments were arranged for patient based on recent hospital discharge notes.  PCP - Dr WeNani RavensFriday  11/25/2020 at 11[facility] - Dr PaPosey Prontos Monday, September 12th at 11[address] [address] IkWindell Norfolkctober 12th at 11:15am.   Patient Goals/Self-Care Activities Over the next 90 days, patient will:  take medications as prescribed, focus on medication adherence by using pill container if needed, and collaborate with provider on medication access solutions  Follow Up Plan: Telephone follow up appointment with care management team member scheduled for:  1 week.          Medication Assistance: Application for Trelegy or Breztri  medication assistance program. in process.  Anticipated assistance start date 12/01/2020.  See plan of care for additional detail. Patient report she has 21 days of Trelegy on hand  [facility] [id number]: [phone]: [phone] Follow Up:  Patient agrees to Care Plan and Follow-up.  Plan: Telephone follow up appointment with care management team member scheduled for:  1 week  TaCherre RobinsPharmD Clinical Pharmacist [facility]

## 2020-11-23 NOTE — Patient Instructions (Signed)
Visit Information  PATIENT GOALS:  Goals Addressed             This Visit's Progress    Pharmacy Care Plan and Goals       Current Barriers:  Unable to independently afford treatment regimen Unable to achieve control of COPD  Unable to adhere to prescribed medication regimen  Pharmacist Clinical Goal(s):  Over the next 90 days, patient will verbalize ability to afford treatment regimen achieve control of COPD as evidenced by decreased exacerbations and increased use of maintenance inhaler maintain control of elevated cholesterol as evidenced by LDL <70  adhere to prescribed medication regimen as evidenced by refill history through collaboration with PharmD and provider.   Interventions: 1:1 collaboration with Shelda Pal, DO regarding development and update of comprehensive plan of care as evidenced by provider attestation and co-signature Inter-disciplinary care team collaboration (see longitudinal plan of care) Comprehensive medication review performed; medication list updated in electronic medical record  Hyperlipidemia: Not at goal; LDL goal <70 - last LDL was 77  Current treatment: atorvastatin '40mg'$  daily  Interventions:   Educated on LDL goals Recommended patient continue to take atorvastatin '40mg'$  daily  Recommended she limit intake of saturated and trans fat If LDL continues to be >70, consider increasing atorvastatin to '80mg'$  daily or switch to rosuvastatin '40mg'$  daily   Chronic Obstructive Pulmonary Disease: Uncontrolled but improving since started Trelegy:  Current treatment: Trelegy Inhaler 156mg - inhale one puff into lungs once a day  Albuterol inhaler or nebulized solution use as needed up to every 6 hour for shortness of breath or wheezing.  Interventions: Reviewed maintenance versus rescue inhalers Please see TCherre Robinswhen you are in [facility] on Friday to sign Trelegy patient assistance application Re-establish with pulmonology -  assisted in rescheduling appointment for 12/28/2020 at 11:15am  Osteopenia / hypocalcemia / hypoparathyroidism / hypothyroidism / fatigue:  Managed by endocrinologist - Dr PPosey Pronto Current treatment:  Tums / calcium carbonate 3 times a day  Calcitriol 0.548m - taking 2 capsules daily  Levothyroxine 7554m- take 1 tablet daily 6 days per week, none on Sundays.  Interventions:  Continue to follow with Dr PatPosey Prontossisted patient with scheduling hospital follow up with Dr PatPosey Prontor 11/28/2020 at 11am Consider rechecking DEXA  Medication Management:  [facility] [place] coverage (medication coverage)  She is using a discount coupon which helps keep most of her medications at $12 or less per 90 days except Trelegy.  Interventions:  Used Medicare.gov to complete Part D (drug benefit) coverage search. Identified 3 possible plans for patient. Printed information and mailed to her - included monthly premium, deductible and [facility] current medication as well as phone numbers for each plan so patient to contact and enroll if interested.    The following appointments were arranged for patient based on recent hospital discharge notes.  PCP - Dr WenNani Ravensriday 11/25/2020 [facility] PatPosey Pronto Monday, September 12th at [place] City[place] IkaWindell [place]to[place] 12th at 11:15am.  Patient Goals/Self-Care Activities Over the next 90 days, patient will:  take medications as prescribed,  Focus on medication adherence by using pill container if needed Collaborate with provider on medication access solutions  Follow Up Plan: Telephone follow up appointment with care management team member scheduled for:  1 week to check on PAP forms.  Patient verbalizes understanding of instructions provided today and agrees to view in [place].   Face to Face appointment with care management team member scheduled for:   plan to see patient while in office 11/25/20 to get PAP signed.   [name], PharmD Clinical Pharmacist [facility]

## 2020-11-25 ENCOUNTER — Inpatient Hospital Stay: Payer: Medicare Other | Admitting: Family Medicine

## 2020-11-25 ENCOUNTER — Ambulatory Visit: Payer: Medicare Other

## 2020-11-25 ENCOUNTER — Ambulatory Visit: Payer: Medicare Other | Admitting: Pharmacist

## 2020-11-25 DIAGNOSIS — J441 Chronic obstructive pulmonary disease with (acute) exacerbation: Secondary | ICD-10-CM

## 2020-11-25 DIAGNOSIS — M1991 Primary osteoarthritis, unspecified site: Secondary | ICD-10-CM

## 2020-11-25 NOTE — Patient Instructions (Signed)
Visit Information  PATIENT GOALS:  Goals Addressed             This Visit's Progress    Pharmacy Care Plan and Goals       Current Barriers:  Unable to independently afford treatment regimen Unable to achieve control of COPD  Unable to adhere to prescribed medication regimen  Pharmacist Clinical Goal(s):  Over the next 90 days, patient will verbalize ability to afford treatment regimen achieve control of COPD as evidenced by decreased exacerbations and increased use of maintenance inhaler maintain control of elevated cholesterol as evidenced by LDL <70  adhere to prescribed medication regimen as evidenced by refill history through collaboration with PharmD and provider.   Interventions: 1:1 collaboration with Shelda Pal, DO regarding development and update of comprehensive plan of care as evidenced by provider attestation and co-signature Inter-disciplinary care team collaboration (see longitudinal plan of care) Comprehensive medication review performed; medication list updated in electronic medical record  Hyperlipidemia: Not at goal; LDL goal <70 - last LDL was 77  Current treatment: atorvastatin '40mg'$  daily  Interventions:   Educated on LDL goals Recommended patient continue to take atorvastatin '40mg'$  daily  Recommended she limit intake of saturated and trans fat If LDL continues to be >70, consider increasing atorvastatin to '80mg'$  daily or switch to rosuvastatin '40mg'$  daily   Chronic Obstructive Pulmonary Disease: Uncontrolled but improving since started Trelegy:  Current treatment: Trelegy Inhaler 166mg - inhale one puff into lungs once a day  Albuterol inhaler or nebulized solution use as needed up to every 6 hour for shortness of breath or wheezing.  Interventions: Reviewed maintenance versus rescue inhalers Please see TCherre Robinswhen you are in [facility] on Friday to sign Trelegy patient assistance application Re-establish with pulmonology -  assisted in rescheduling appointment for 12/28/2020 at 11:15am  Osteopenia / hypocalcemia / hypoparathyroidism / hypothyroidism / fatigue:  Managed by endocrinologist - Dr PPosey Pronto Current treatment:  Tums / calcium carbonate 3 times a day  Calcitriol 0.553m - taking 2 capsules daily  Levothyroxine 7571m- take 1 tablet daily 6 days per week, none on Sundays.  Interventions:  Continue to follow with Dr PatPosey Prontossisted patient with scheduling hospital follow up with Dr PatPosey Prontor 11/28/2020 at 11am Consider rechecking DEXA  Medication Management:  [facility] no current [facility] coverage (medication coverage)  She is using a discount coupon which helps keep most of her medications at $12 or less per 90 days except Trelegy.  Interventions:  Used Medicare.gov to complete Part D (drug benefit) coverage search. Identified 3 possible plans for patient. Printed information and mailed to her - included monthly premium, deductible and [facility] medication as well as phone numbers for each plan so patient to contact and enroll if interested.    The following appointments were arranged for patient based on recent hospital discharge notes.  PCP - Dr WenNani Ravensuesday 11/29/2020 at 3pm.  Endocrinology - Dr PatPosey Pronto Monday, September 12th at 11aAntoineDr IkaWindell Norfolktober 12th at 11:15am.  Patient Goals/Self-Care Activities Over the next 90 days, patient will:  take medications as prescribed,  Focus on medication adherence by using pill container if needed Collaborate with provider on medication access solutions  Follow Up Plan: Telephone follow up appointment with care management team member scheduled for:  1 week.               The  patient verbalized understanding of instructions, educational materials, and care plan provided today and declined offer to receive copy of patient instructions, educational materials, and  care plan.   Telephone follow up appointment with care management team member scheduled for: 1 weeks  [name], PharmD Clinical Pharmacist [facility] [place] [facility]

## 2020-11-25 NOTE — Chronic Care Management (AMB) (Signed)
Chronic Care Management Pharmacy Note  11/25/2020 Name:  Chiana Wamser MRN:  [id number] DOB:  11-07-1945  Summary: Recent hospital admission with anemia related to blood loss and many electrolyte abnormalities.  COPD - patient has not completed and returned patient assistance for Trelegy.  Daughter is planning to be more involved in patient's care. She reports patient is using albuterol a lot and feels patient might need oxygen therapy. Will see PCP 11/29/2020. Also thinks patient might need home health or in home assistance.   Recommendations/Changes made from today's visit:  Rescheduled PCP appointment so patient's daughter can be her with her.  Also discussed medication assistance applications with daughter. She will gather financial information and patient will sign applications when in office to see PCP 9/13//2022  Subjective: Lelah Rennaker is an 75 y.o. year old female who is a primary patient of Shelda Pal, DO.  The CCM team was consulted for assistance with disease management and care coordination needs.    Engaged with daughter (health care POA)   for  follow up and discussion about patients current health status and ability to continue to care for health on her own.   Initial contact was started in response to provider referral for pharmacy case management and/or care coordination services.   Consent to Services:  The patient was given information about Chronic Care Management services, agreed to services, and gave verbal consent prior to initiation of services.  Please see initial visit note for detailed documentation.   Patient Care Team: Shelda Pal, DO as PCP - General (Family Medicine) Buford Dresser, MD as PCP - Cardiology (Cardiology) Juanito Doom, MD as Consulting Physician (Pulmonary Disease) [name], PharmD (Pharmacist) Garner Nash, DO as Consulting Physician (Pulmonary Disease)  Recent office visits: 09/23/2020  - PCP - phone call - patient to pick up #4 samples of Trelegy 09/21/2020 - PCP (Dr Nani Ravens) F/U COPD. Prescribed prednisone 67m daily until she can get Trelegy. Prescribed meloxicam 7.572mdaily for osteoarthritis.  09/14/2020 - PCP (Dr WeNani RavensCOPD exacerbation; Rx prednisone 401md for 5 days; albuterol inhaler or nebs as needed  Recent consult visits: 09/15/2020 - ophthalmology ([facility] - Dr. ForTrinna Postataract eval; no surgery for cataract removal at this time - monitoring;  09/13/2020 - Endo ([facility] - Dr PatPosey Pronto/U post-surgical hypoparathyroidism and hypothyroidism. Repeat TSH, T4, calcium and PTH; reflled calcitriol. Refill Synthroid.   Hospital visits: 11/10/2020 to 11/14/2020 [facility] servere anemia. Also has low magnesium, potassium and calcium.  Medications started at discharge: oral iron supplement 150m3mice a day (Nu-Iron)  potassium supplement 20mE14mce a day for 14 days acetaminophen 325mg 31mke 2 tablets = 650mg e64m 6 hours as needed Medication changes at discharge: increased magnesium oxide to 400mg tw49ma day  [facility] [place] h/o hypoparathyroidism and COPD exacerbation. Ca+ was 5.6 at endo and instructed to go to ER.  New Meds - restart calcitriol 0.5mcg dai15m Meds discontinued at discharge: cholecalciferol 1000 units; Aspirin 81mg, ato62mtatin 40mg, famo52mne 20mg,ferrou72mlfate.   Objective:  Lab Results  Component Value Date   CREATININE 0.73 06/17/2020   CREATININE 0.78 02/10/2019   CREATININE 0.81 02/02/2019    No results found for: HGBA1C Last diabetic Eye exam: No results found for: HMDIABEYEEXA  Last diabetic Foot exam: No results found for: HMDIABFOOTEX      Component Value Date/Time  CHOL 139 06/17/2020 1158   TRIG 82.0 06/17/2020 1158   HDL 45.90 06/17/2020 1158   CHOLHDL 3 06/17/2020 1158   VLDL 16.4  06/17/2020 1158   LDLCALC 77 06/17/2020 1158    Hepatic Function Latest Ref Rng & Units 06/17/2020 02/10/2019 02/02/2019  Total Protein 6.0 - 8.3 g/dL 7.2 7.7 6.9  Albumin 3.5 - 5.2 g/dL 3.5 3.9 3.6  AST 0 - 37 U/L _0 ALT 0 - 35 U/L _1 Alk Phosphatase 39 - 117 U/L 67 62 57  Total Bilirubin 0.2 - 1.2 mg/dL 0.4 0.5 0.4  Bilirubin, Direct 0.1 - 0.5 mg/dL - - -    Lab Results  Component Value Date/Time   TSH 0.18 (L) 12/23/2019 09:07 AM   TSH 19.37 (H) 02/02/2019 02:05 PM   FREET4 1.5 12/23/2019 09:07 AM   FREET4 1.04 02/02/2019 02:05 PM    CBC Latest Ref Rng & Units 02/26/2019 02/10/2019 02/02/2019  WBC 4.0 - 10.5 K/uL 7.2 7.6 6.4  Hemoglobin 12.0 - 15.0 g/dL 10.8(L) 9.7(L) 9.8(L)  Hematocrit 36.0 - 46.0 % 34.7(L) 31.8(L) 30.5(L)  Platelets 150 - 400 K/uL 318 396 442.0(H)    No results found for: VD25OH  Clinical ASCVD: Yes  The 10-year ASCVD risk score (Arnett DK, et al., 2019) is: 26.4%   Values used to calculate the score:     Age: 66 years     Sex: Female     Is Non-Hispanic African American: Yes     Diabetic: Yes     Tobacco smoker: No     Systolic Blood Pressure: 631 mmHg     Is BP treated: Yes     HDL Cholesterol: 45.9 mg/dL     Total Cholesterol: 139 mg/dL     Social History   Tobacco Use  Smoking Status Former   Packs/day: 0.75   Years: 50.00   Pack years: 37.50   Types: Cigarettes   Start date: 03/19/1965   Quit date: 11/10/2018   Years since quitting: 2.0  Smokeless Tobacco Never  Tobacco Comments   stopped smoknig august/2020!   BP Readings from Last 3 Encounters:  09/21/20 138/76  09/14/20 (!) 160/80  06/17/20 138/80   Pulse Readings from Last 3 Encounters:  09/21/20 79  09/14/20 85  06/17/20 95   Wt Readings from Last 3 Encounters:  09/21/20 118 lb 4 oz (53.6 kg)  09/14/20 115 lb 8 oz (52.4 kg)  06/17/20 112 lb 2 oz (50.9 kg)    Assessment: Review of patient past medical history, allergies, medications, health status,  including review of consultants reports, laboratory and other test data, was performed as part of comprehensive evaluation and provision of chronic care management services.   SDOH:  (Social Determinants of Health) assessments and interventions performed:    CCM Care Plan  No Known Allergies  Medications Reviewed Today     Reviewed by [name], PharmD (Pharmacist) on 11/25/20 at [place] List Status: <None>   Medication Order Taking? Sig Documenting Provider Last Dose Status Informant  acetaminophen (TYLENOL) 325 MG tablet [id number] Yes Take 2 tablets by mouth every 6 (six) hours as needed. [provider] Taking Active   albuterol (PROVENTIL) (2.5 MG/3ML) 0.083% nebulizer solution [id number] Yes Take 3 mLs (2.5 mg total) by nebulization every 6 (six) hours as needed for wheezing or shortness of breath. Shelda Pal, DO Taking Active   atorvastatin (LIPITOR) 40 MG tablet [id number] Yes Take 1 tablet (40 mg  total) by mouth daily. Shelda Pal, DO Taking Active   busPIRone (BUSPAR) 15 MG tablet [id number] Yes TAKE 1 TABLET BY MOUTH THREE TIMES DAILY Nani Ravens Crosby Oyster, DO Taking Active   calcitRIOL (ROCALTROL) 0.5 MCG capsule [id number] Yes Take 1 mcg by mouth daily. [provider] Taking Active   Calcium Carbonate Antacid (TUMS PO) [id number] Yes Take 1,000 mg by mouth 3 (three) times daily. [provider] Taking Active   cholecalciferol (VITAMIN D3) 25 MCG (1000 UNIT) tablet [id number] Yes Take 1,000 Units by mouth daily. [provider] Taking Active   Fluticasone-Umeclidin-Vilant (TRELEGY ELLIPTA) 100-62.5-25 MCG/INH AEPB [id number] Yes Inhale 1 puff into the lungs daily. [provider] Taking Active   guaifenesin (HUMIBID E) 400 MG TABS tablet [id number] Yes Take 400 mg by mouth every 6 (six) hours as needed. [provider] Taking Active   iron polysaccharides (NIFEREX) 150 MG capsule [id number] Yes Take 1  capsule by mouth in the morning and at bedtime. [provider] Taking Active   levothyroxine (SYNTHROID) 75 MCG tablet [id number] Yes Take 1 tablet (75 mcg total) by mouth daily before breakfast. Shelda Pal, DO Taking Active            Med Note Antony Contras, TAMMY B   Tue Oct 25, 2020 11:22 AM) Take 1 tablet 6 days per week and none on Sundays.   magnesium oxide (MAG-OX) 400 (240 Mg) MG tablet [id number] Yes Take 1 tablet by mouth 2 (two) times daily. [provider] Taking Active   meloxicam (MOBIC) 7.5 MG tablet [id number] No Take 1 tablet (7.5 mg total) by mouth daily.  Patient not taking: Reported on 11/25/2020   Shelda Pal, DO Not Taking Active            Med Note Antony Contras, Adria Dill Nov 17, 2020  3:38 PM) 11/17/2020 patient told to hold until sees gastroenterologist  potassium chloride SA (KLOR-CON) 20 MEQ tablet [id number] Yes Take 1 tablet by mouth daily. [provider] Taking Active   propranolol (INDERAL) 20 MG tablet [id number] Yes Take 1 tablet (20 mg total) by mouth 3 (three) times daily. Shelda Pal, DO Taking Active   VENTOLIN HFA 108 [phone] Base) MCG/ACT inhaler [id number] Yes Inhale 1-2 puffs into the lungs every 6 (six) hours as needed for wheezing or shortness of breath. Shelda Pal, DO Taking Active             Patient Active Problem List   Diagnosis Date Noted   Aortic atherosclerosis ([place])    Bilateral carotid artery stenosis 08/27/2019   Coronary artery calcification seen on CT scan 08/27/2019   Idiopathic hypoparathyroidism ([place]) 02/02/2019   Ischemic colitis ([place]) 02/02/2019   Medication management 01/05/2019   Healthcare maintenance 12/17/2018   Former smoker 04/14/2018   Pulmonary emphysema ([place]) 01/09/2018   Tremor 08/19/2017   Tachycardia 07/10/2017   Hypothyroid    Hypothyroidism 06/12/2016   Pharyngeal dysphagia 06/12/2016   Quality of voice, hoarse 06/12/2016   Goiter 05/23/2016    GERD (gastroesophageal reflux disease) 05/23/2016   Generalized anxiety disorder 05/23/2016    Immunization History  Administered Date(s) Administered   Moderna Sars-Covid-2 Vaccination 07/09/2019, 08/06/2019   Pneumococcal Conjugate-13 05/23/2016   Pneumococcal Polysaccharide-23 07/10/2017   Tdap 05/23/2016    Conditions to be addressed/monitored: CAD, HTN, HLD, COPD, and hypothyroidism; hypoparathyroidism, low calcium; anemia; AVMs; anxiety; emphysema / COPD, GAD< joint pain, GERD  Care Plan : General Pharmacy (Adult)  Updates  made by [name], PHARMD since 11/25/2020 12:00 AM     Problem: Emphysema / COPD; HDL; CAD / coronary artery calcium; hypothyroidism; hypoparathyrroidism; tremor; GAD; GERD; joint pain   Priority: High     Long-Range Goal: Pharmacy goals related to chronic care and medication management   Start Date: 09/29/2020  Priority: High  Note:   Current Barriers:  Unable to independently afford treatment regimen Unable to achieve control of COPD  Does not adhere to prescribed medication regimen Recent hospitalization with medication changes. Patient education needed  Pharmacist Clinical Goal(s):  Over the next 90 days, patient will verbalize ability to afford treatment regimen achieve control of COPD as evidenced by decreased exacerbations and increased use of maintenance inhaler maintain control of HDL as evidenced by LDL <70  adhere to prescribed medication regimen as evidenced by refill history  through collaboration with PharmD and provider.   Interventions: 1:1 collaboration with Shelda Pal, DO regarding development and update of comprehensive plan of care as evidenced by provider attestation and co-signature Inter-disciplinary care team collaboration (see longitudinal plan of care) Comprehensive medication review performed; medication list updated in electronic medical record  Hyperlipidemia: Not at goal; LDL goal <70 - last LDL was 77   Noted to have carotid artery stenosis, aortic atherosclerosis and positive coronary artery calcium on CT scan  Current treatment: atorvastatin 49m daily  Medications previously tried: none  Interventions: (addressed at previous visit)  Educated on LDL goals Recommended patient continue to take atorvastatin 470mdaily  Recommended she limit intake of saturated and trans fat If LDL continues to be >70, consider increasing atorvastatin to 8036maily or switch to rosuvastatin 26m19mily   Chronic Obstructive Pulmonary Disease: Uncontrolled but improving since started Trelegy.  Goal: reduce shortness of breath / wheezing and prevent exacerbations Current treatment:  Trelegy Inhaler 100mc15minhale one puff into lungs once a day Albuterol inhaler or nebs - use up to every 6 hours as needed for shortness of breath / wheezine.  In July 2022 patient was unable to afford Trelegy so had not been taking. Dr WendlNani Ravenssecure 1-2 months of samples and patient has started. She reports breathing has greatly improved.  She reports today that she has 21 days of Trelegy samples remaining We have mailed applications for patient assistance for Trelelgy twice. At last CCM phone visit patient stated she still had not received applications but daughter repots today that Mrs. BreweYeriannot checked her mail in over 2 weeks. Daughter will check mail for applications.  I again completed provider portion of inhaler medication assistance applications. Patient was suppose to have appointment with PCP today for hospital follow up and face to face meeting with me to sign and review medication assistance application but daughter needed to cancel so she could be with patient. Rescheduled for 11/29/2020.  Patient was supposed to have appointment with Dr IkardWindell Norfolklmonologist on the day that she was recently admitted to hospital for severe anemia / blood loss. Now rescheduled for October 2022.  Use of maintenance inhaler:  patient endorses she is using every day Use of rescue inhaler: per daughter patient is using rescue inhaler at least every hour Patient continues to smoke GOLD Classification: D MMRC/CAT score: 18 1 exacerbations requiring treatment in the last 6 months  Interventions: Reviewed maintenance versus rescue inhalers Again completed applications for Trelegy and will have patient sign 11/29/2020 Re-establish with pulmonology -  (Dr IkardWindell [place] [facility]) [facility]  missed appointment (completed at previous visit)   Anemia due to blood loss:  Recent hospitalization with HBG of 4.1 on admission. 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  Patient is still taking meloxicam though it appears she was to stop all NSAIDS at discharge and take acetaminophen 671m up to every 6 hours as needed instead.  Interventions: Patient instructed to hold meloxicam until GI follow up Noted that patient has referral pending for GI follow up  Osteopenia / hypocalcemia / hypoparathyroidism / hypothyroidism / fatigue:  Managed by endocrinologist - Dr [facility]osey [facility]ont[facility] she continue to have fatigue - could be related to thyroid function - levothyroxine dose was lowered in June 2022 due to low TSH.   Last serum calcium was improved but still low. Also noted that last HGB was low at 8.0 during hospitalization in March 2022. If still low could be reason for fatigue DEXA 05/21/2016: Femur Total Left T-score -2.1. AP Spine L1-L4 T-Score -1.9  Frax estimate: Major Osteoporotic Fracture: 8.4% / Hip Fracture: 2.0% Current treatment:   Tums / calcium carbonate 3 times a day due to hypercalcemia / hypoparathyroidism  calcitriol 0.540m - taking 2 capsules daily  Levothyroxine 7517m- take 1 tablet daily 6 days per week, none on Sundays.  Interventions: (addressed at previous visits)  Continue to follow with Dr PatPosey Prontogarding hypoparathyroidism and  hypocalcemia.  Assisted patient in making follow up appointment with Dr PatPosey Prontonsider rechecking DEXA  Medication Management:  [facility] current [facility] coverage - confirmed with [facility] She is using a discount coupon which helps keep most of her medications at $12 or less per 90 days except Trelegy.  Interventions: (addressed at previous appointment)  Used Medicare.gov to complete Part D (drug benefit) coverage search. Identified 3 possible plans for patient. Printed information and mailed to her - included monthly premium, deductible and [facility] current medication as well as phone numbers for each plan so patient to contact and enroll if interested.   The following appointments were arranged for patient based on recent hospital discharge notes.  PCP - Dr WenNani Ravensriday 11/25/2020 at 11aGainesvilleEndocrinology - Dr PatPosey Pronto Monday, September 12th at 11aGlyndonDr IkaWindell Norfolktober 12th at 11:15am.   Patient Goals/Self-Care Activities Over the next 90 days, patient will:  take medications as prescribed, focus on medication adherence by using pill container if needed, and collaborate with provider on medication access solutions  Follow Up Plan: Telephone follow up appointment with care management team member scheduled for:  1 week.    Follow up with PCP on 11/29/2020      Medication Assistance: Application for Trelegy or Breztri  medication assistance program. in process.  Anticipated assistance start date 12/01/2020.  See plan of care for additional detail. Patient report she has 21 days of Trelegy on hand  [facility] [place] [id number]: [phone]: [phone] Up:  Patient agrees to Care Plan and Follow-up.  Plan: Telephone follow up appointment with care management team member  scheduled for:  1 week  TamCherre RobinsharmD Clinical Pharmacist [facility]

## 2020-11-28 ENCOUNTER — Telehealth: Payer: Self-pay | Admitting: Family Medicine

## 2020-11-28 DIAGNOSIS — F419 Anxiety disorder, unspecified: Secondary | ICD-10-CM | POA: Diagnosis present

## 2020-11-28 DIAGNOSIS — R627 Adult failure to thrive: Secondary | ICD-10-CM | POA: Diagnosis present

## 2020-11-28 DIAGNOSIS — I081 Rheumatic disorders of both mitral and tricuspid valves: Secondary | ICD-10-CM | POA: Diagnosis not present

## 2020-11-28 DIAGNOSIS — Y999 Unspecified external cause status: Secondary | ICD-10-CM | POA: Diagnosis not present

## 2020-11-28 DIAGNOSIS — A0472 Enterocolitis due to Clostridium difficile, not specified as recurrent: Secondary | ICD-10-CM | POA: Diagnosis present

## 2020-11-28 DIAGNOSIS — E209 Hypoparathyroidism, unspecified: Secondary | ICD-10-CM | POA: Diagnosis not present

## 2020-11-28 DIAGNOSIS — E7220 Disorder of urea cycle metabolism, unspecified: Secondary | ICD-10-CM | POA: Diagnosis not present

## 2020-11-28 DIAGNOSIS — R197 Diarrhea, unspecified: Secondary | ICD-10-CM | POA: Diagnosis not present

## 2020-11-28 DIAGNOSIS — E890 Postprocedural hypothyroidism: Secondary | ICD-10-CM | POA: Diagnosis present

## 2020-11-28 DIAGNOSIS — J90 Pleural effusion, not elsewhere classified: Secondary | ICD-10-CM | POA: Diagnosis not present

## 2020-11-28 DIAGNOSIS — R9082 White matter disease, unspecified: Secondary | ICD-10-CM | POA: Diagnosis not present

## 2020-11-28 DIAGNOSIS — R636 Underweight: Secondary | ICD-10-CM | POA: Diagnosis present

## 2020-11-28 DIAGNOSIS — W1839XA Other fall on same level, initial encounter: Secondary | ICD-10-CM | POA: Diagnosis not present

## 2020-11-28 DIAGNOSIS — R4182 Altered mental status, unspecified: Secondary | ICD-10-CM | POA: Diagnosis not present

## 2020-11-28 DIAGNOSIS — E876 Hypokalemia: Secondary | ICD-10-CM | POA: Diagnosis not present

## 2020-11-28 DIAGNOSIS — R55 Syncope and collapse: Secondary | ICD-10-CM | POA: Diagnosis present

## 2020-11-28 DIAGNOSIS — Z79890 Hormone replacement therapy (postmenopausal): Secondary | ICD-10-CM | POA: Diagnosis not present

## 2020-11-28 DIAGNOSIS — R0902 Hypoxemia: Secondary | ICD-10-CM | POA: Diagnosis not present

## 2020-11-28 DIAGNOSIS — R531 Weakness: Secondary | ICD-10-CM | POA: Diagnosis not present

## 2020-11-28 DIAGNOSIS — Z681 Body mass index (BMI) 19 or less, adult: Secondary | ICD-10-CM | POA: Diagnosis not present

## 2020-11-28 DIAGNOSIS — J449 Chronic obstructive pulmonary disease, unspecified: Secondary | ICD-10-CM | POA: Diagnosis not present

## 2020-11-28 DIAGNOSIS — E039 Hypothyroidism, unspecified: Secondary | ICD-10-CM | POA: Diagnosis not present

## 2020-11-28 DIAGNOSIS — Z87891 Personal history of nicotine dependence: Secondary | ICD-10-CM | POA: Diagnosis not present

## 2020-11-28 DIAGNOSIS — E43 Unspecified severe protein-calorie malnutrition: Secondary | ICD-10-CM | POA: Diagnosis not present

## 2020-11-28 DIAGNOSIS — K219 Gastro-esophageal reflux disease without esophagitis: Secondary | ICD-10-CM | POA: Diagnosis present

## 2020-11-28 NOTE — Telephone Encounter (Signed)
Pt. Daughter called in and stated that Pt. Was taken to the hospital via ambulance on 9/12. She was found laying in the floor of her home incoherent. Pt daughter is very concerned for her mothers health and would like to discuss care after coming home. She has noticed a drastic change in her mothers health especially when it comes to her memory. She states its gotten to the point where she doesn't remember the last time she ate and when she did visit her mother and found her eating, the food was covered in nats. She can be reached at the number below:  Kellie: [phone]

## 2020-11-29 ENCOUNTER — Inpatient Hospital Stay: Payer: Medicare Other | Admitting: Family Medicine

## 2020-11-29 DIAGNOSIS — Z681 Body mass index (BMI) 19 or less, adult: Secondary | ICD-10-CM | POA: Diagnosis not present

## 2020-11-29 DIAGNOSIS — Z87891 Personal history of nicotine dependence: Secondary | ICD-10-CM | POA: Diagnosis not present

## 2020-11-29 DIAGNOSIS — R636 Underweight: Secondary | ICD-10-CM | POA: Diagnosis present

## 2020-11-29 DIAGNOSIS — R627 Adult failure to thrive: Secondary | ICD-10-CM | POA: Diagnosis present

## 2020-11-29 DIAGNOSIS — I081 Rheumatic disorders of both mitral and tricuspid valves: Secondary | ICD-10-CM | POA: Diagnosis not present

## 2020-11-29 DIAGNOSIS — E890 Postprocedural hypothyroidism: Secondary | ICD-10-CM | POA: Diagnosis present

## 2020-11-29 DIAGNOSIS — J449 Chronic obstructive pulmonary disease, unspecified: Secondary | ICD-10-CM | POA: Diagnosis not present

## 2020-11-29 DIAGNOSIS — E876 Hypokalemia: Secondary | ICD-10-CM | POA: Diagnosis not present

## 2020-11-29 DIAGNOSIS — R197 Diarrhea, unspecified: Secondary | ICD-10-CM | POA: Diagnosis not present

## 2020-11-29 DIAGNOSIS — F419 Anxiety disorder, unspecified: Secondary | ICD-10-CM | POA: Diagnosis not present

## 2020-11-29 DIAGNOSIS — R4182 Altered mental status, unspecified: Secondary | ICD-10-CM | POA: Diagnosis not present

## 2020-11-29 DIAGNOSIS — R55 Syncope and collapse: Secondary | ICD-10-CM | POA: Diagnosis present

## 2020-11-29 DIAGNOSIS — Z79890 Hormone replacement therapy (postmenopausal): Secondary | ICD-10-CM | POA: Diagnosis not present

## 2020-11-29 DIAGNOSIS — K219 Gastro-esophageal reflux disease without esophagitis: Secondary | ICD-10-CM | POA: Diagnosis not present

## 2020-11-29 DIAGNOSIS — A0472 Enterocolitis due to Clostridium difficile, not specified as recurrent: Secondary | ICD-10-CM | POA: Diagnosis not present

## 2020-11-29 DIAGNOSIS — E7220 Disorder of urea cycle metabolism, unspecified: Secondary | ICD-10-CM | POA: Diagnosis not present

## 2020-11-29 DIAGNOSIS — E209 Hypoparathyroidism, unspecified: Secondary | ICD-10-CM | POA: Diagnosis not present

## 2020-11-29 DIAGNOSIS — E039 Hypothyroidism, unspecified: Secondary | ICD-10-CM | POA: Diagnosis not present

## 2020-12-01 ENCOUNTER — Telehealth: Payer: Self-pay | Admitting: Family Medicine

## 2020-12-01 ENCOUNTER — Telehealth: Payer: Medicare Other

## 2020-12-01 ENCOUNTER — Telehealth: Payer: Self-pay | Admitting: Pharmacist

## 2020-12-01 NOTE — Telephone Encounter (Signed)
Ronalee Belts from [facility] called and wants to know if Nani Ravens will do a follow up for home health. He would like a call back at the follow number below. Please advise  [facility]: [phone]

## 2020-12-01 NOTE — Telephone Encounter (Signed)
Opened in error

## 2020-12-02 NOTE — Telephone Encounter (Signed)
Called left message to call back 

## 2020-12-02 NOTE — Telephone Encounter (Signed)
Yes, OK to sched. Ty.

## 2020-12-02 NOTE — Telephone Encounter (Signed)
We  will need a followup/with PCP in order for medicare to cover PT.  Scheduled an appointment with Dr. Nani Ravens Keep number of PT to contact once order for HHPT is made. Called the patient to schedule with PCP--no answer/VM full

## 2020-12-02 NOTE — Telephone Encounter (Signed)
Called no answer mailbox full

## 2020-12-03 DIAGNOSIS — A0472 Enterocolitis due to Clostridium difficile, not specified as recurrent: Secondary | ICD-10-CM | POA: Diagnosis not present

## 2020-12-05 NOTE — Telephone Encounter (Signed)
Called the patient informed of instructions. She will need to speak to her son who will need to bring her to the appointment. She / or he will call back to schedule this appointment once she knows his schedule and when he can bring her in.

## 2020-12-06 ENCOUNTER — Telehealth: Payer: Self-pay | Admitting: *Deleted

## 2020-12-06 NOTE — Telephone Encounter (Signed)
Called the patients son and schedule appt for this Friday 12/09/20

## 2020-12-06 NOTE — Telephone Encounter (Signed)
Who Is Calling Patient / Member / Family / Caregiver Caller Name Dellis Anes Caller Phone Number [phone] Patient Name Lisamarie Coke Patient DOB 07-07-1945 Call Type Message Only Information Provided Reason for Call Request to Schedule Office Appointment Initial Comment Caller states he received a call from the office regarding his mother . Caller states it is regarding setting up an appt. for physical therapy. Caller is wanting to schedule an appt. Patient request to speak to RN No Additional Comment Office hours provided. Declined triage Disp. Time Disposition Final User 12/06/2020 8:01:43 AM General Information Provided Yes Mikki Santee

## 2020-12-08 ENCOUNTER — Telehealth: Payer: Medicare Other

## 2020-12-09 ENCOUNTER — Ambulatory Visit (INDEPENDENT_AMBULATORY_CARE_PROVIDER_SITE_OTHER): Payer: Medicare Other | Admitting: Family Medicine

## 2020-12-09 ENCOUNTER — Encounter: Payer: Self-pay | Admitting: Family Medicine

## 2020-12-09 ENCOUNTER — Other Ambulatory Visit: Payer: Self-pay

## 2020-12-09 VITALS — BP 112/60 | HR 81 | Temp 98.4°F | Ht 64.0 in | Wt 98.2 lb

## 2020-12-09 DIAGNOSIS — R2689 Other abnormalities of gait and mobility: Secondary | ICD-10-CM | POA: Diagnosis not present

## 2020-12-09 DIAGNOSIS — J439 Emphysema, unspecified: Secondary | ICD-10-CM

## 2020-12-09 DIAGNOSIS — R197 Diarrhea, unspecified: Secondary | ICD-10-CM | POA: Diagnosis not present

## 2020-12-09 DIAGNOSIS — R5381 Other malaise: Secondary | ICD-10-CM | POA: Diagnosis not present

## 2020-12-09 NOTE — Patient Instructions (Addendum)
Send me a message or call 2-3 weeks before you are going to run out of your Trelegy samples. That will give [place] some time to reach out to the rep to get some more.   If you do not hear anything about your referral in the next 1-2 weeks, call our office and ask for an update.  Try some metamucil or Benefiber. Send me a message Monday if no better and we will get you a stool kit to take a sample.   Let us know if you need anything.

## 2020-12-09 NOTE — Progress Notes (Addendum)
Chief Complaint  Patient presents with   needs PT order    Subjective: Patient is a 75 y.o. female here for f/u. Here w son.   Patient has a history of balance issues and progressive diffuse weakness.  She requested physical therapy but Medicare denied it without a face-to-face.  She has progressively worsened over the past 2 months.  She notes that food does not taste as good as it used to and drinks Ensure to supplement her nutrition.  She does not exercise routinely.  With a past 4 days the patient has had diarrhea.  Will sometimes alternate between regular stools and then loose.  No bleeding, fevers, abdominal pain, nausea, or vomiting.  No sick contacts.  No recent antibiotic use or travel.  She has not tried anything at home so far.  Patient has a history of COPD, emphysema.  She takes Trelegy 1 puff daily.  When she is compliant/has a medication, it works well.  No side effects. She doesn't always have it which contributes to her overall weakness and current physical state.   Past Medical History:  Diagnosis Date   Aortic atherosclerosis (HCC)    Emphysema of lung ([place])    Generalized anxiety disorder 05/23/2016   GERD (gastroesophageal reflux disease) 05/23/2016   History of pregnancy induced hypertension    Hypoparathyroidism (HCC)    Acquired post surg   Hypothyroid     Objective: BP 112/60   Pulse 81   Temp 98.4 F (36.9 C) (Oral)   Ht 5\' 4"  (1.626 m)   Wt 98 lb 4 oz (44.6 kg)   SpO2 97%   BMI 16.86 kg/m  General: Awake, appears stated age Heart: RRR, 1+ pitting lower extremity edema tapering at the distal third of the tibia bilaterally Lungs: Distant breath sounds but otherwise CTAB, no rales, wheezes or rhonchi. No accessory muscle use Neuro: Patient is nonambulatory, no cerebellar signs Psych: Age appropriate judgment and insight, normal affect and mood  Assessment and Plan: Physical deconditioning - Plan: Ambulatory referral to [place] problem -  Plan: Ambulatory referral to Home Health  Diarrhea of presumed infectious origin  Pulmonary emphysema, unspecified emphysema type ([place]) - Plan: Ambulatory referral to [facility]  1/2.  Chronic, uncontrolled.  Refer home health. 2/2 #4 as discussed above.  3. Fiber supp. Cx if no better by Mon. 4.  Chronic, stable when treated.  Refill Trelegy- samples given today.  The patient voiced understanding and agreement to the plan.  [place], DO 12/12/20  12:56 PM

## 2020-12-16 ENCOUNTER — Telehealth: Payer: Self-pay | Admitting: Family Medicine

## 2020-12-16 DIAGNOSIS — M1991 Primary osteoarthritis, unspecified site: Secondary | ICD-10-CM | POA: Diagnosis not present

## 2020-12-16 DIAGNOSIS — I2510 Atherosclerotic heart disease of native coronary artery without angina pectoris: Secondary | ICD-10-CM

## 2020-12-16 DIAGNOSIS — E785 Hyperlipidemia, unspecified: Secondary | ICD-10-CM | POA: Diagnosis not present

## 2020-12-16 DIAGNOSIS — J441 Chronic obstructive pulmonary disease with (acute) exacerbation: Secondary | ICD-10-CM | POA: Diagnosis not present

## 2020-12-16 NOTE — Telephone Encounter (Signed)
Mliss Sax from [facility] called to inform Nani Ravens that they could not start care today 12/16/2020 due to patient leaving town. They will be resuming care the week of 12/19/2020.

## 2020-12-19 ENCOUNTER — Ambulatory Visit: Payer: Medicare Other | Admitting: Family Medicine

## 2020-12-20 ENCOUNTER — Other Ambulatory Visit: Payer: Self-pay | Admitting: Family Medicine

## 2020-12-20 DIAGNOSIS — F411 Generalized anxiety disorder: Secondary | ICD-10-CM

## 2020-12-23 DIAGNOSIS — E039 Hypothyroidism, unspecified: Secondary | ICD-10-CM | POA: Diagnosis not present

## 2020-12-23 DIAGNOSIS — J439 Emphysema, unspecified: Secondary | ICD-10-CM | POA: Diagnosis not present

## 2020-12-23 DIAGNOSIS — K219 Gastro-esophageal reflux disease without esophagitis: Secondary | ICD-10-CM | POA: Diagnosis not present

## 2020-12-23 DIAGNOSIS — I10 Essential (primary) hypertension: Secondary | ICD-10-CM | POA: Diagnosis not present

## 2020-12-23 DIAGNOSIS — F411 Generalized anxiety disorder: Secondary | ICD-10-CM | POA: Diagnosis not present

## 2020-12-23 DIAGNOSIS — I700 Atherosclerosis of aorta: Secondary | ICD-10-CM | POA: Diagnosis not present

## 2020-12-23 DIAGNOSIS — E209 Hypoparathyroidism, unspecified: Secondary | ICD-10-CM | POA: Diagnosis not present

## 2020-12-27 ENCOUNTER — Ambulatory Visit: Payer: Medicare Other | Admitting: Family Medicine

## 2020-12-27 DIAGNOSIS — J439 Emphysema, unspecified: Secondary | ICD-10-CM | POA: Diagnosis not present

## 2020-12-27 DIAGNOSIS — E039 Hypothyroidism, unspecified: Secondary | ICD-10-CM | POA: Diagnosis not present

## 2020-12-27 DIAGNOSIS — K219 Gastro-esophageal reflux disease without esophagitis: Secondary | ICD-10-CM | POA: Diagnosis not present

## 2020-12-27 DIAGNOSIS — I10 Essential (primary) hypertension: Secondary | ICD-10-CM | POA: Diagnosis not present

## 2020-12-27 DIAGNOSIS — E209 Hypoparathyroidism, unspecified: Secondary | ICD-10-CM | POA: Diagnosis not present

## 2020-12-27 DIAGNOSIS — F411 Generalized anxiety disorder: Secondary | ICD-10-CM | POA: Diagnosis not present

## 2020-12-28 ENCOUNTER — Encounter: Payer: Self-pay | Admitting: Pulmonary Disease

## 2020-12-28 ENCOUNTER — Other Ambulatory Visit: Payer: Self-pay

## 2020-12-28 ENCOUNTER — Ambulatory Visit (INDEPENDENT_AMBULATORY_CARE_PROVIDER_SITE_OTHER): Payer: Medicare Other | Admitting: Pulmonary Disease

## 2020-12-28 VITALS — BP 102/52 | HR 103 | Temp 98.0°F | Ht 64.0 in | Wt 105.2 lb

## 2020-12-28 DIAGNOSIS — J449 Chronic obstructive pulmonary disease, unspecified: Secondary | ICD-10-CM | POA: Diagnosis not present

## 2020-12-28 DIAGNOSIS — Z87891 Personal history of nicotine dependence: Secondary | ICD-10-CM | POA: Diagnosis not present

## 2020-12-28 DIAGNOSIS — J432 Centrilobular emphysema: Secondary | ICD-10-CM | POA: Diagnosis not present

## 2020-12-28 MED ORDER — TRELEGY ELLIPTA 100-62.5-25 MCG/INH IN AEPB
1.0000 | INHALATION_SPRAY | Freq: Every day | RESPIRATORY_TRACT | 0 refills | Status: DC
Start: 2020-12-28 — End: 2020-12-30

## 2020-12-28 NOTE — Patient Instructions (Addendum)
Thank you for visiting Dr. Valeta Harms at [facility]. Today we recommend the following:  Continue trelegy  [place] financial aid application   Return in about 6 months (around 06/28/2021) for with APP or Dr. Valeta Harms.    Please do your part to reduce the spread of COVID-19.

## 2020-12-28 NOTE — Progress Notes (Signed)
Synopsis: Referred in February 2021 establish care with new pulmonary provider, former patient Dr. Lake Bells PCP: Shelda Pal*  Subjective:   PATIENT ID: Andrea Ward GENDER: female DOB: 1945-10-17, MRN: [id number]  Chief Complaint  Patient presents with   Follow-up    This is a 75 year old female past medical history of hypoparathyroidism, gastroesophageal reflux disease anxiety and emphysema.  Patient had spirometry March 2020: Office spirometry FEV1 FVC ratio 57, FEV1 0.8 L 42% predicted.  Patient is a former smoker quit in August 2020 approximately 37-pack-year history.  OV 05/14/2019: Day following pulmonary function test.  FEV1 0.97 L, 53, no significant bronchodilator response, RV/TLC 160%, DLCO 50%.  Today we reviewed her PFTs in the office.  She is currently breathing well at baseline.  She is using Anoro Ellipta as she has a needed prior authorization for her Anoro or switching to a separate medication we discussed the various medications available today.  Plan to switch to [employer].  She is, monitor symptoms and let us know if she wants to switch back to Anoro if Stiolto will be adequate.  I suspect she will do fine with this however she may have some difficulty with the device.  We will train her on this device prior to leaving the office today.  Patient denies hemoptysis weight loss.  OV 12/28/2020: recent hospitalization. Doing better. No longer using a wheel chair. Up with a walker. Started PT at home. Using trelegy for COPD.  She still feels weak but feels like she is getting better.  At the office today with her son who agrees that she is heading in the right direction.  Has plans for a low-dose lung cancer screening CT repeat this month or next.  Last scan was in October 2021 lung RADS 2.      Past Medical History:  Diagnosis Date   Aortic atherosclerosis (HCC)    Emphysema of lung ([place])    Generalized anxiety disorder 05/23/2016   GERD (gastroesophageal reflux  disease) 05/23/2016   History of pregnancy induced hypertension    Hypoparathyroidism ([place])    Acquired post surg   Hypothyroid      Family History  Problem Relation Age of Onset   Hypertension Mother    Hypertension Father    Anuerysm Sister      Past Surgical History:  Procedure Laterality Date   THYROIDECTOMY  05/17/2016   TONSILLECTOMY      Social History   Socioeconomic History   Marital status: Single    Spouse name: Not on file   Number of children: Not on file   Years of education: Not on file   Highest education level: Not on file  Occupational History   Not on file  Tobacco Use   Smoking status: Former    Packs/day: 0.75    Years: 50.00    Pack years: 37.50    Types: Cigarettes    Start date: 03/19/1965    Quit date: 11/10/2018    Years since quitting: 2.1   Smokeless tobacco: Never   Tobacco comments:    stopped smoknig august/2020!  Vaping Use   Vaping Use: Never used  Substance and Sexual Activity   Alcohol use: Yes    Alcohol/week: 5.0 standard drinks    Types: 5 Glasses of wine per week   Drug use: Not Currently   Sexual activity: Not Currently  Other Topics Concern   Not on file  Social History Narrative   Not on file   Social  Determinants of Health   Financial Resource Strain: Medium Risk   Difficulty of Paying Living Expenses: Somewhat hard  Food Insecurity: Not on file  Transportation Needs: Not on file  Physical Activity: Inactive   Days of Exercise per Week: 0 days   Minutes of Exercise per Session: 0 min  Stress: Not on file  Social Connections: Not on file  Intimate Partner Violence: Not on file     No Known Allergies   Outpatient Medications Prior to Visit  Medication Sig Dispense Refill   albuterol (PROVENTIL) (2.5 MG/3ML) 0.083% nebulizer solution Take 3 mLs (2.5 mg total) by nebulization every 6 (six) hours as needed for wheezing or shortness of breath. 150 mL 1   acetaminophen (TYLENOL) 325 MG tablet Take 2 tablets by  mouth every 6 (six) hours as needed. (Patient not taking: Reported on 12/28/2020)     atorvastatin (LIPITOR) 40 MG tablet Take 1 tablet (40 mg total) by mouth daily. (Patient not taking: Reported on 12/28/2020) 90 tablet 3   busPIRone (BUSPAR) 15 MG tablet TAKE 1 TABLET BY MOUTH THREE TIMES DAILY (Patient not taking: Reported on 12/28/2020) 270 tablet 0   calcitRIOL (ROCALTROL) 0.5 MCG capsule Take 1 mcg by mouth daily. (Patient not taking: Reported on 12/28/2020)     Calcium Carbonate Antacid (TUMS PO) Take 1,000 mg by mouth 3 (three) times daily. (Patient not taking: Reported on 12/28/2020)     cholecalciferol (VITAMIN D3) 25 MCG (1000 UNIT) tablet Take 1,000 Units by mouth daily. (Patient not taking: Reported on 12/28/2020)     Fluticasone-Umeclidin-Vilant (TRELEGY ELLIPTA) 100-62.5-25 MCG/INH AEPB Inhale 1 puff into the lungs daily. (Patient not taking: Reported on 12/28/2020)     guaifenesin (HUMIBID E) 400 MG TABS tablet Take 400 mg by mouth every 6 (six) hours as needed. (Patient not taking: Reported on 12/28/2020)     iron polysaccharides (NIFEREX) 150 MG capsule Take 1 capsule by mouth in the morning and at bedtime. (Patient not taking: Reported on 12/28/2020)     levothyroxine (SYNTHROID) 75 MCG tablet Take 1 tablet (75 mcg total) by mouth daily before breakfast. (Patient not taking: Reported on 12/28/2020) 90 tablet 2   magnesium oxide (MAG-OX) 400 (240 Mg) MG tablet Take 1 tablet by mouth 2 (two) times daily. (Patient not taking: Reported on 12/28/2020)     meloxicam (MOBIC) 7.5 MG tablet Take 1 tablet (7.5 mg total) by mouth daily. (Patient not taking: Reported on 12/28/2020) 30 tablet 0   propranolol (INDERAL) 20 MG tablet Take 1 tablet (20 mg total) by mouth 3 (three) times daily. (Patient not taking: Reported on 12/28/2020) 270 tablet 2   VENTOLIN HFA 108 (90 Base) MCG/ACT inhaler Inhale 1-2 puffs into the lungs every 6 (six) hours as needed for wheezing or shortness of breath. (Patient  not taking: Reported on 12/28/2020) 18 g 5   No facility-administered medications prior to visit.    Review of Systems  Constitutional:  Negative for chills, fever, malaise/fatigue and weight loss.  HENT:  Negative for hearing loss, sore throat and tinnitus.   Eyes:  Negative for blurred vision and double vision.  Respiratory:  Positive for shortness of breath. Negative for cough, hemoptysis, sputum production, wheezing and stridor.   Cardiovascular:  Negative for chest pain, palpitations, orthopnea, leg swelling and PND.  Gastrointestinal:  Negative for abdominal pain, constipation, diarrhea, heartburn, nausea and vomiting.  Genitourinary:  Negative for dysuria, hematuria and urgency.  Musculoskeletal:  Negative for joint pain and myalgias.  Skin:  Negative for itching and rash.  Neurological:  Negative for dizziness, tingling, weakness and headaches.  Endo/Heme/Allergies:  Negative for environmental allergies. Does not bruise/bleed easily.  Psychiatric/Behavioral:  Negative for depression. The patient is not nervous/anxious and does not have insomnia.   All other systems reviewed and are negative.   Objective:  Physical Exam Vitals reviewed.  Constitutional:      General: She is not in acute distress.    Appearance: She is well-developed.  HENT:     Head: Normocephalic and atraumatic.     Mouth/Throat:     Pharynx: No oropharyngeal exudate.  Eyes:     Conjunctiva/sclera: Conjunctivae normal.     Pupils: Pupils are equal, round, and reactive to light.  Neck:     Vascular: No JVD.     Trachea: No tracheal deviation.     Comments: Loss of supraclavicular fat Cardiovascular:     Rate and Rhythm: Normal rate and regular rhythm.     Heart sounds: S1 normal and S2 normal.     Comments: Distant heart tones Pulmonary:     Effort: No tachypnea or accessory muscle usage.     Breath sounds: No stridor. Decreased breath sounds (throughout all lung fields) present. No wheezing,  rhonchi or rales.     Comments: Diminished breath sounds bilaterally Abdominal:     General: Bowel sounds are normal. There is no distension.     Palpations: Abdomen is soft.     Tenderness: There is no abdominal tenderness.  Musculoskeletal:        General: Deformity (muscle wasting ) present.  Skin:    General: Skin is warm and dry.     Capillary Refill: Capillary refill takes less than 2 seconds.     Findings: No rash.  Neurological:     Mental Status: She is alert and oriented to person, place, and time.  Psychiatric:        Behavior: Behavior normal.     Vitals:   12/28/20 1117  BP: (!) 102/52  Pulse: (!) 103  Temp: 98 F (36.7 C)  TempSrc: Oral  SpO2: 92%  Weight: 105 lb 3.2 oz (47.7 kg)  Height: 5\' 4"  (1.626 m)   92% on RA BMI Readings from Last 3 Encounters:  12/28/20 18.06 kg/m  12/09/20 16.86 kg/m  09/21/20 19.09 kg/m   Wt Readings from Last 3 Encounters:  12/28/20 105 lb 3.2 oz (47.7 kg)  12/09/20 98 lb 4 oz (44.6 kg)  09/21/20 118 lb 4 oz (53.6 kg)     CBC    Component Value Date/Time   WBC 7.2 02/26/2019 0805   RBC 3.00 (L) 02/26/2019 0805   HGB 10.8 (L) 02/26/2019 0805   HGB 9.7 (L) 02/10/2019 1341   HCT 34.7 (L) 02/26/2019 0805   PLT 318 02/26/2019 0805   PLT 396 02/10/2019 1341   MCV 115.7 (H) 02/26/2019 0805   MCH 36.0 (H) 02/26/2019 0805   MCHC 31.1 02/26/2019 0805   RDW 24.3 (H) 02/26/2019 0805   LYMPHSABS 0.5 (L) 02/26/2019 0805   MONOABS 0.3 02/26/2019 0805   EOSABS 0.2 02/26/2019 0805   BASOSABS 0.0 02/26/2019 0805    Chest Imaging: October 2020 lung cancer screening CT lung RADS 2  Pulmonary Functions Testing Results: PFT Results Latest Ref Rng & Units 05/14/2019  FVC-Pre L 1.84  FVC-Predicted Pre % 78  FVC-Post L 1.97  FVC-Predicted Post % 84  Pre FEV1/FVC % % 51  Post FEV1/FCV % % 49  FEV1-Pre  L 0.93  FEV1-Predicted Pre % 51  FEV1-Post L 0.97  DLCO uncorrected ml/min/mmHg 9.79  DLCO UNC% % 50  DLCO corrected  ml/min/mmHg 9.79  DLCO COR %Predicted % 50  DLVA Predicted % 66  TLC L 6.41  TLC % Predicted % 126  RV % Predicted % 204       Assessment & Plan:     ICD-10-CM   1. Stage 3 severe COPD by GOLD classification (HCC)  J44.9     2. Former smoker  Z87.891     3. Centrilobular emphysema (HCC)  J43.2       Assessment:   This is a 75 year old female, severe COPD, enrolled in our lung cancer screening program.  Currently managed on triple therapy regimen, Trelegy.  Plan: Recommend continuation of Trelegy Continued enrollment in our lung cancer screening CT program. Continue work with PT. Stressed importance of her staying as active as she could and as strong as she can with her lung disease. Standard COPD follow-up in 6 months with [place] or as needed.    Current Outpatient Medications:    albuterol (PROVENTIL) (2.5 MG/3ML) 0.083% nebulizer solution, Take 3 mLs (2.5 mg total) by nebulization every 6 (six) hours as needed for wheezing or shortness of breath., Disp: 150 mL, Rfl: 1   acetaminophen (TYLENOL) 325 MG tablet, Take 2 tablets by mouth every 6 (six) hours as needed. (Patient not taking: Reported on 12/28/2020), Disp: , Rfl:    atorvastatin (LIPITOR) 40 MG tablet, Take 1 tablet (40 mg total) by mouth daily. (Patient not taking: Reported on 12/28/2020), Disp: 90 tablet, Rfl: 3   busPIRone (BUSPAR) 15 MG tablet, TAKE 1 TABLET BY MOUTH THREE TIMES DAILY (Patient not taking: Reported on 12/28/2020), Disp: 270 tablet, Rfl: 0   calcitRIOL (ROCALTROL) 0.5 MCG capsule, Take 1 mcg by mouth daily. (Patient not taking: Reported on 12/28/2020), Disp: , Rfl:    Calcium Carbonate Antacid (TUMS PO), Take 1,000 mg by mouth 3 (three) times daily. (Patient not taking: Reported on 12/28/2020), Disp: , Rfl:    cholecalciferol (VITAMIN D3) 25 MCG (1000 UNIT) tablet, Take 1,000 Units by mouth daily. (Patient not taking: Reported on 12/28/2020), Disp: , Rfl:    Fluticasone-Umeclidin-Vilant (TRELEGY  ELLIPTA) 100-62.5-25 MCG/INH AEPB, Inhale 1 puff into the lungs daily. (Patient not taking: Reported on 12/28/2020), Disp: , Rfl:    guaifenesin (HUMIBID E) 400 MG TABS tablet, Take 400 mg by mouth every 6 (six) hours as needed. (Patient not taking: Reported on 12/28/2020), Disp: , Rfl:    iron polysaccharides (NIFEREX) 150 MG capsule, Take 1 capsule by mouth in the morning and at bedtime. (Patient not taking: Reported on 12/28/2020), Disp: , Rfl:    levothyroxine (SYNTHROID) 75 MCG tablet, Take 1 tablet (75 mcg total) by mouth daily before breakfast. (Patient not taking: Reported on 12/28/2020), Disp: 90 tablet, Rfl: 2   magnesium oxide (MAG-OX) 400 (240 Mg) MG tablet, Take 1 tablet by mouth 2 (two) times daily. (Patient not taking: Reported on 12/28/2020), Disp: , Rfl:    meloxicam (MOBIC) 7.5 MG tablet, Take 1 tablet (7.5 mg total) by mouth daily. (Patient not taking: Reported on 12/28/2020), Disp: 30 tablet, Rfl: 0   propranolol (INDERAL) 20 MG tablet, Take 1 tablet (20 mg total) by mouth 3 (three) times daily. (Patient not taking: Reported on 12/28/2020), Disp: 270 tablet, Rfl: 2   VENTOLIN HFA 108 (90 Base) MCG/ACT inhaler, Inhale 1-2 puffs into the lungs every 6 (six) hours as needed for  wheezing or shortness of breath. (Patient not taking: Reported on 12/28/2020), Disp: 18 g, Rfl: 5   Garner Nash, DO [facility] 12/28/2020 11:42 AM

## 2020-12-28 NOTE — Addendum Note (Signed)
Addended by: Fran Lowes on: 12/28/2020 12:08 PM   Modules accepted: Orders

## 2020-12-29 DIAGNOSIS — E209 Hypoparathyroidism, unspecified: Secondary | ICD-10-CM | POA: Diagnosis not present

## 2020-12-29 DIAGNOSIS — E039 Hypothyroidism, unspecified: Secondary | ICD-10-CM | POA: Diagnosis not present

## 2020-12-29 DIAGNOSIS — J439 Emphysema, unspecified: Secondary | ICD-10-CM | POA: Diagnosis not present

## 2020-12-29 DIAGNOSIS — F411 Generalized anxiety disorder: Secondary | ICD-10-CM | POA: Diagnosis not present

## 2020-12-29 DIAGNOSIS — K219 Gastro-esophageal reflux disease without esophagitis: Secondary | ICD-10-CM | POA: Diagnosis not present

## 2020-12-29 DIAGNOSIS — I10 Essential (primary) hypertension: Secondary | ICD-10-CM | POA: Diagnosis not present

## 2020-12-30 ENCOUNTER — Ambulatory Visit (INDEPENDENT_AMBULATORY_CARE_PROVIDER_SITE_OTHER): Payer: Medicare Other | Admitting: Pharmacist

## 2020-12-30 ENCOUNTER — Telehealth: Payer: Self-pay | Admitting: Pharmacist

## 2020-12-30 ENCOUNTER — Other Ambulatory Visit: Payer: Self-pay | Admitting: Family Medicine

## 2020-12-30 DIAGNOSIS — E039 Hypothyroidism, unspecified: Secondary | ICD-10-CM

## 2020-12-30 DIAGNOSIS — R030 Elevated blood-pressure reading, without diagnosis of hypertension: Secondary | ICD-10-CM

## 2020-12-30 DIAGNOSIS — J439 Emphysema, unspecified: Secondary | ICD-10-CM

## 2020-12-30 DIAGNOSIS — I2510 Atherosclerotic heart disease of native coronary artery without angina pectoris: Secondary | ICD-10-CM

## 2020-12-30 MED ORDER — TRELEGY ELLIPTA 100-62.5-25 MCG/INH IN AEPB: 1.0000 | INHALATION_SPRAY | Freq: Every day | RESPIRATORY_TRACT | 1 refills | Status: DC

## 2020-12-30 NOTE — Telephone Encounter (Signed)
Printed and gave hardcopy to pharmacist

## 2020-12-30 NOTE — Telephone Encounter (Signed)
Patient assistance program for Trelelgy needs printed prescription for Trelegy for 90 days.  Forwarding request to Dr Irene Limbo CMA.

## 2021-01-02 DIAGNOSIS — E209 Hypoparathyroidism, unspecified: Secondary | ICD-10-CM | POA: Diagnosis not present

## 2021-01-02 DIAGNOSIS — I10 Essential (primary) hypertension: Secondary | ICD-10-CM | POA: Diagnosis not present

## 2021-01-02 DIAGNOSIS — K219 Gastro-esophageal reflux disease without esophagitis: Secondary | ICD-10-CM | POA: Diagnosis not present

## 2021-01-02 DIAGNOSIS — J439 Emphysema, unspecified: Secondary | ICD-10-CM | POA: Diagnosis not present

## 2021-01-02 DIAGNOSIS — E039 Hypothyroidism, unspecified: Secondary | ICD-10-CM | POA: Diagnosis not present

## 2021-01-02 DIAGNOSIS — F411 Generalized anxiety disorder: Secondary | ICD-10-CM | POA: Diagnosis not present

## 2021-01-03 ENCOUNTER — Telehealth: Payer: Self-pay | Admitting: Pharmacist

## 2021-01-03 NOTE — Patient Instructions (Signed)
Visit Information  PATIENT GOALS:  Goals Addressed             This Visit's Progress    Pharmacy Care Plan and Goals       Current Barriers:  Unable to independently afford treatment regimen Unable to achieve control of COPD  Unable to adhere to prescribed medication regimen  Pharmacist Clinical Goal(s):  Over the next 90 days, patient will verbalize ability to afford treatment regimen achieve control of COPD as evidenced by decreased exacerbations and increased use of maintenance inhaler maintain control of elevated cholesterol as evidenced by LDL <70  adhere to prescribed medication regimen as evidenced by refill history through collaboration with PharmD and provider.   Interventions: 1:1 collaboration with Shelda Pal, DO regarding development and update of comprehensive plan of care as evidenced by provider attestation and co-signature Inter-disciplinary care team collaboration (see longitudinal plan of care) Comprehensive medication review performed; medication list updated in electronic medical record  Hyperlipidemia: Not at goal; LDL goal <70 - last LDL was 77  Current treatment: atorvastatin 40mg  daily  Interventions:   Educated on LDL goals Recommended patient restart / continue to take atorvastatin 40mg  daily  Recommended she limit intake of saturated and trans fat If LDL continues to be >70, consider increasing atorvastatin to 80mg  daily or switch to rosuvastatin 40mg  daily   Chronic Obstructive Pulmonary Disease: Uncontrolled but improving since started Trelegy:  Current treatment: Trelegy Inhaler 186mcg - inhale one puff into lungs once a day  Albuterol inhaler or nebulized solution use as needed up to every 6 hour for shortness of breath or wheezing.  Interventions: Reviewed maintenance versus rescue inhalers Continue to follow up with [name] Application has been faxed for help with Trelegy. Expect to hear if accepted in next 2  weeks.  Osteopenia / hypocalcemia / hypoparathyroidism / hypothyroidism / fatigue:  Managed by endocrinologist - [name]  Current treatment:  Calcium + vitamin D  Interventions:  Continue to follow with [name]  Consider rechecking DEXA  Medication Management:  Current pharmacy: [facility] Patient has no current Part D coverage (medication coverage)  She is using a discount coupon which helps keep most of her medications at $12 or less per 90 days except Trelegy.  Interventions:  Used Medicare.gov to complete Part D (drug benefit) coverage search. Identified 3 possible plans for patient. Printed information and mailed to her - included monthly premium, deductible and estimated medication costs at [facility] for her current medication as well as phone numbers for each plan so patient to contact and enroll if interested.    Patient Goals/Self-Care Activities Over the next 90 days, patient will:  take medications as prescribed,  Focus on medication adherence by using pill container if needed Collaborate with provider on medication access solutions  Follow Up Plan: Telephone follow up appointment with care management team member scheduled for:  2 weeks               The patient verbalized understanding of instructions, educational materials, and care plan provided today and declined offer to receive copy of patient instructions, educational materials, and care plan.   Telephone follow up appointment with care management team member scheduled for: 2 weeks  [name], PharmD Clinical Pharmacist [facility]

## 2021-01-03 NOTE — Telephone Encounter (Signed)
-----   Message from Shelda Pal, DO sent at 12/30/2020  2:59 PM EDT ----- Regarding: RE: medications Should be on Ca 1200 mg/d and Vit D, Synthroid, B12 and Mg fine to stop. Ty.  ----- Message ----- From: [name], PharmD Sent: 12/30/2020  12:51 PM EDT To: Shelda Pal, DO Subject: medications                                    I have not been completed by notes yet but when I spoke to Mrs. Deerman she said the only medication she was taking was buspirone, Trelegy and albuterol.  Per patient she was told to only take those 3 when she left the hospital. However discharge summary has these medications also list to continue / resume: levothyroxine, calcitriol, vitamin D, calcium, magnesium  and B12.   I did not see anywhere else that these were to be stopped.  Should she resume all or have labs checked?   Tayia Stonesifer

## 2021-01-03 NOTE — Chronic Care Management (AMB) (Signed)
Chronic Care Management Pharmacy Note  01/03/2021 Name:  Andrea Ward MRN:  [id number] DOB:  Jul 30, 1945  Subjective: Andrea Ward is an 75 y.o. year old female who is a primary patient of Shelda Pal, DO.  The CCM team was consulted for assistance with disease management and care coordination needs.    Engaged with patient by telephone for follow up visit Initial contact was started in response to provider referral for pharmacy case management and/or care coordination services.   Consent to Services:  The patient was given information about Chronic Care Management services, agreed to services, and gave verbal consent prior to initiation of services.  Please see initial visit note for detailed documentation.   Patient Care Team: Shelda Pal, DO as PCP - General (Family Medicine) Buford Dresser, MD as PCP - Cardiology (Cardiology) Juanito Doom, MD as Consulting Physician (Pulmonary Disease) [name], PharmD (Pharmacist) Garner Nash, DO as Consulting Physician (Pulmonary Disease)  Recent office visits: 12/09/2020 - PCP (Dr Nani Ravens) Seen for weakness and balance issues. Refreal for home health PT. Recommended Fiber Supplement for diarrhea 09/23/2020 - PCP - phone call - patient to pick up #4 samples of Trelegy 09/21/2020 - PCP (Dr Nani Ravens) F/U COPD. Prescribed prednisone 58m daily until she can get Trelegy. Prescribed meloxicam 7.527mdaily for osteoarthritis.  09/14/2020 - PCP (Dr WeNani RavensCOPD exacerbation; Rx prednisone 4074md for 5 days; albuterol inhaler or nebs as needed  Recent consult visits: 01/07/2021 - Pulmonary (dr IcaValeta Harms/U severe COPD. Continue Trelegy 100m43maily - given sample. F/U in 6 months. 09/15/2020 - ophthalmology ([facility] - Dr. ForsTrinna Posttaract eval; no surgery for cataract removal at this time - monitoring;  09/13/2020 - Endo ([facility] - Dr PatePosey ProntoU post-surgical hypoparathyroidism and hypothyroidism. Repeat  TSH, T4, calcium and PTH; reflled calcitriol. Refill Synthroid.   [facility]  New medications at discharge: vancomysin 50mg44m- take 2.5mL =68m5mg e36m 6 hours for 8 days Medications changed at discharge: Increased levothyroxine to 100mcg d52m 6 days per week/ Sundays off.  Medications stopped at discharge: potassium chloride 20 mEq and propranolol 20mg  0875m2022 to 11/14/2020 [facility] servere anemia. Also has low magnesium, potassium and calcium.  Medications started at discharge: oral iron supplement 150mg twic10mday (Nu-Iron)  potassium supplement 20mEq once53may for 14 days acetaminophen 325mg - take73mablets = 650mg every 662mrs as needed Medication changes at discharge: increased magnesium oxide to 400mg twice a 46m 06/03/2020 to [facility] [place] [facility] hyp[facility] and COPD exacerbation. Ca+ was 5.6 at endo and instructed to go to ER.  New Meds - restart calcitriol 0.5mcg daily. Me74mdiscontinued at discharge: cholecalciferol 1000 units; Aspirin 81mg, atorvasta61m40mg, famotidine94mg,ferrous sulf22m   Objective:  Lab Results  Component Value Date   CREATININE 0.73 06/17/2020   CREATININE 0.78 02/10/2019   CREATININE 0.81 02/02/2019    No results found for: HGBA1C Last diabetic Eye exam: No results found for: HMDIABEYEEXA  Last diabetic Foot exam: No results found for: HMDIABFOOTEX      Component Value Date/Time   CHOL 139 06/17/2020 1158   TRIG 82.0 06/17/2020 1158   HDL 45.90 06/17/2020 1158   CHOLHDL 3 06/17/2020 1158   VLDL 16.4 06/17/2020 1158   LDLCALC 77 06/17/2020 1158  Hepatic Function Latest Ref Rng & Units 06/17/2020 02/10/2019 02/02/2019  Total Protein 6.0 - 8.3 g/dL 7.2 7.7 6.9  Albumin 3.5 - 5.2 g/dL 3.5 3.9 3.6  AST 0 - 37  U/L 12 15 16   ALT 0 - 35 U/L 5 5 5   Alk Phosphatase 39 - 117 U/L 67 62 57  Total Bilirubin 0.2 - 1.2 mg/dL 0.4 0.5 0.4  Bilirubin, Direct 0.1 - 0.5 mg/dL - - -    Lab Results  Component Value Date/Time   TSH 0.18 (L) 12/23/2019 09:07 AM   TSH 19.37 (H) 02/02/2019 02:05 PM   FREET4 1.5 12/23/2019 09:07 AM   FREET4 1.04 02/02/2019 02:05 PM    CBC Latest Ref Rng & Units 02/26/2019 02/10/2019 02/02/2019  WBC 4.0 - 10.5 K/uL 7.2 7.6 6.4  Hemoglobin 12.0 - 15.0 g/dL 10.8(L) 9.7(L) 9.8(L)  Hematocrit 36.0 - 46.0 % 34.7(L) 31.8(L) 30.5(L)  Platelets 150 - 400 K/uL 318 396 442.0(H)    No results found for: VD25OH  Clinical ASCVD: Yes  The 10-year ASCVD risk score (Arnett DK, et al., 2019) is: 15.8%   Values used to calculate the score:     Age: 56 years     Sex: Female     Is Non-Hispanic African American: Yes     Diabetic: Yes     Tobacco smoker: No     Systolic Blood Pressure: 811 mmHg     Is BP treated: No     HDL Cholesterol: 45.9 mg/dL     Total Cholesterol: 139 mg/dL     Social History   Tobacco Use  Smoking Status Former   Packs/day: 0.75   Years: 50.00   Pack years: 37.50   Types: Cigarettes   Start date: 03/19/1965   Quit date: 11/10/2018   Years since quitting: 2.1  Smokeless Tobacco Never  Tobacco Comments   stopped smoknig august/2020!   BP Readings from Last 3 Encounters:  12/28/20 (!) 102/52  12/09/20 112/60  09/21/20 138/76   Pulse Readings from Last 3 Encounters:  12/28/20 (!) 103  12/09/20 81  09/21/20 79   Wt Readings from Last 3 Encounters:  12/28/20 105 lb 3.2 oz (47.7 kg)  12/09/20 98 lb 4 oz (44.6 kg)  09/21/20 118 lb 4 oz (53.6 kg)    Assessment: Review of patient past medical history, allergies, medications, health status, including review of consultants reports, laboratory and other test data, was performed as part of comprehensive evaluation and provision of chronic care management services.   SDOH:  (Social Determinants of Health)  assessments and interventions performed:    CCM Care Plan  No Known Allergies  Medications Reviewed Today     Reviewed by [name], PharmD (Pharmacist) on 12/30/20 at 1244  Med List Status: <None>   Medication Order Taking? Sig Documenting Provider Last Dose Status Informant  acetaminophen (TYLENOL) 325 MG tablet [id number] Yes Take 2 tablets by mouth every 6 (six) hours as needed. [provider] Taking Active   albuterol (PROVENTIL) (2.5 MG/3ML) 0.083% nebulizer solution [id number] Yes Take 3 mLs (2.5 mg total) by nebulization every 6 (six) hours as needed for wheezing or shortness of breath. Shelda Pal, DO Taking Active   atorvastatin (LIPITOR) 40 MG tablet [id number] No Take 1 tablet (40 mg total) by mouth daily.  Patient not taking: Reported on 12/30/2020   Shelda Pal, DO Not Taking Active   busPIRone (BUSPAR) 15 MG tablet [id number] Yes TAKE 1 TABLET BY MOUTH THREE TIMES  DAILY Shelda Pal, DO Taking Active   calcitRIOL (ROCALTROL) 0.5 MCG capsule [id number] No Take 1 mcg by mouth daily.  Patient not taking: Reported on 12/30/2020   [provider] Not Taking Active   Calcium Carbonate Antacid (TUMS PO) [id number] No Take 1,000 mg by mouth 3 (three) times daily.  Patient not taking: Reported on 12/30/2020   [provider] Not Taking Active   cholecalciferol (VITAMIN D3) 25 MCG (1000 UNIT) tablet [id number] No Take 1,000 Units by mouth daily.  Patient not taking: Reported on 12/30/2020   [provider] Not Taking Active   Fluticasone-Umeclidin-Vilant (TRELEGY ELLIPTA) 100-62.5-25 MCG/INH AEPB [id number] Yes Inhale 1 puff into the lungs daily. Shelda Pal, DO Taking Active   guaifenesin (HUMIBID E) 400 MG TABS tablet [id number] No Take 400 mg by mouth every 6 (six) hours as needed.  Patient not taking: Reported on 12/30/2020   [provider] Not Taking Active   iron polysaccharides  (NIFEREX) 150 MG capsule [id number] No Take 1 capsule by mouth in the morning and at bedtime.  Patient not taking: Reported on 12/30/2020   [provider] Not Taking Active   levothyroxine (SYNTHROID) 75 MCG tablet [id number] No Take 1 tablet (75 mcg total) by mouth daily before breakfast.  Patient not taking: Reported on 12/30/2020   Shelda Pal, DO Not Taking Active            Med Note Antony Contras, [place] Oct 25, 2020 11:22 AM) Take 1 tablet 6 days per week and none on Sundays.   magnesium oxide (MAG-OX) 400 (240 Mg) MG tablet [id number] No Take 1 tablet by mouth 2 (two) times daily.  Patient not taking: Reported on 12/30/2020   [provider] Not Taking Active   VENTOLIN HFA 108 (90 Base) MCG/ACT inhaler [id number] Yes Inhale 1-2 puffs into the lungs every 6 (six) hours as needed for wheezing or shortness of breath. Shelda Pal, DO Taking Active             Patient Active Problem List   Diagnosis Date Noted   Physical deconditioning 12/09/2020   Balance problem 12/09/2020   Aortic atherosclerosis ([place])    Bilateral carotid artery stenosis 08/27/2019   Coronary artery calcification seen on CT scan 08/27/2019   Idiopathic hypoparathyroidism ([place]) 02/02/2019   Ischemic colitis ([place]) 02/02/2019   Medication management 01/05/2019   Healthcare maintenance 12/17/2018   Former smoker 04/14/2018   Pulmonary emphysema ([place]) 01/09/2018   Tremor 08/19/2017   Tachycardia 07/10/2017   Hypothyroid    Hypothyroidism 06/12/2016   Pharyngeal dysphagia 06/12/2016   Quality of voice, hoarse 06/12/2016   Goiter 05/23/2016   GERD (gastroesophageal reflux disease) 05/23/2016   Generalized anxiety disorder 05/23/2016    Immunization History  Administered Date(s) Administered   Moderna Sars-Covid-2 Vaccination 07/09/2019, 08/06/2019   Pneumococcal Conjugate-13 05/23/2016   Pneumococcal Polysaccharide-23 07/10/2017   Tdap 05/23/2016    Conditions  to be addressed/monitored: CAD, HTN, HLD, COPD, and hypothyroidism; hypoparathyroidism, low calcium; anemia; AVMs; anxiety; emphysema / COPD, GAD< joint pain, GERD  Care Plan : General Pharmacy (Adult)  Updates made by [name], PHARMD since 01/03/2021 12:00 AM     Problem: Emphysema / COPD; HDL; CAD / coronary artery calcium; hypothyroidism; hypoparathyrroidism; tremor; GAD; GERD; joint pain   Priority: High     Long-Range Goal: Pharmacy goals related to chronic care and medication management   Start Date: 09/29/2020  Priority: High  Note:  Current Barriers:  Unable to independently afford treatment regimen Unable to achieve control of COPD  Does not adhere to prescribed medication regimen Recent hospitalization with medication changes. Patient education needed  Pharmacist Clinical Goal(s):  Over the next 90 days, patient will verbalize ability to afford treatment regimen achieve control of COPD as evidenced by decreased exacerbations and increased use of maintenance inhaler maintain control of HDL as evidenced by LDL <70  adhere to prescribed medication regimen as evidenced by refill history  through collaboration with PharmD and provider.   Interventions: 1:1 collaboration with Shelda Pal, DO regarding development and update of comprehensive plan of care as evidenced by provider attestation and co-signature Inter-disciplinary care team collaboration (see longitudinal plan of care) Comprehensive medication review performed; medication list updated in electronic medical record  Hyperlipidemia: Not at goal; LDL goal <70 - last LDL was 77  Noted to have carotid artery stenosis, aortic atherosclerosis and positive coronary artery calcium on CT scan  Current treatment: atorvastatin 49m daily  Medications previously tried: none  Interventions: (addressed at previous visit)  Educated on LDL goals Recommended patient continue to take atorvastatin 46mdaily   Recommended she limit intake of saturated and trans fat If LDL continues to be >70, consider increasing atorvastatin to 8069maily or switch to rosuvastatin 4m19mily   Chronic Obstructive Pulmonary Disease: Uncontrolled but improving since started Trelegy.  Goal: reduce shortness of breath / wheezing and prevent exacerbations Current treatment:  Trelegy Inhaler 100mc37minhale one puff into lungs once a day Albuterol inhaler or nebs - use up to every 6 hours as needed for shortness of breath / wheezine.   Patient was supposed to have appointment with Dr IkardWindell Norfolklmonologist on the day that she was recently admitted to hospital for severe anemia / blood loss. Now rescheduled for October 2022.  Use of maintenance inhaler: patient endorses she is using every day Use of rescue inhaler: using once a day GOLD Classification: D MMRC/CAT score: 18 1 exacerbations requiring treatment in the last 6 months  Interventions: Reviewed maintenance versus rescue inhalers Trelegy application completed and faxed along with prescription to manufacture assistance program Continue to follow up with pulmonology -  (Dr IkardWindell [facility]) [facility] to [facility]:  Recent hospitalization with HBG of 4.1 on admission. 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  Patient is still taking meloxicam though it appears she was to stop all NSAIDS at discharge and take acetaminophen 650mg 60mo every 6 hours as needed instead.  Interventions: Patient instructed to hold meloxicam until GI follow up (no longer taking) Noted that patient has referral pending for GI follow up and for capsule endoscopy  Osteopenia / hypocalcemia / hypoparathyroidism / hypothyroidism / fatigue:  Managed [facility]inologist [facility] states she continue to have fatigue - could be related to thyroid function - levothyroxine dose was lowered in June 2022 due to low TSH.   Last serum  calcium was improved but still low. Also noted that last HGB was low at 8.0 during hospitalization in March 2022. If still low could be reason for fatigue DEXA 05/21/2016: Femur Total Left T-score -2.1. AP Spine L1-L4 T-Score -1.9  Frax estimate: Major Osteoporotic Fracture: 8.4% / Hip Fracture: 2.0% Current treatment:  Calcium + D daily  Levothyroxine 75mcg 52mke 1 tablet daily 6 days per week, none on Sundays. (Not taking) Interventions: (addressed at previous visits)  Continue to follow with [name] regarding hypoparathyroidism and hypocalcemia.  Per Dr Nani Ravens - OK to remain off levothyroxine (See phone note from 01/03/2021) Recommend recheck thyroid panel and BMP with next visit.  Consider rechecking DEXA  Medication Management:  Current pharmacy: [facility] Patient has no current Part D coverage - confirmed with [employer] She is using a discount coupon which helps keep most of her medications at $12 or less per 90 days except Trelegy.  Interventions: (addressed at previous appointment)  Used Medicare.gov to complete Part D (drug benefit) coverage search. Identified 3 possible plans for patient. Printed information and mailed to her - included monthly premium, deductible and estimated medication costs at [facility] for her current medication as well as phone numbers for each plan so patient to contact and enroll if interested. (Done at previous visit)     Patient Goals/Self-Care Activities Over the next 90 days, patient will:  take medications as prescribed, focus on medication adherence by using pill container if needed, and collaborate with provider on medication access solutions  Follow Up Plan: Telephone follow up appointment with care management team member scheduled for:  2 weeks to check meds and patient assistance application         Medication Assistance: Application for Trelegy or Breztri  medication assistance program. in process.  Anticipated assistance start date  01/15/2021.  See plan of care for additional detail. Patient report she has 28  days of Trelegy on hand  Patient's preferred pharmacy is:  [place], [place] [address] Phone: [phone] Fax: [phone]   Follow Up:  Patient agrees to Care Plan and Follow-up.  Plan: Telephone follow up appointment with care management team member scheduled for:  2 weeks  [name], PharmD Clinical Pharmacist [facility]

## 2021-01-03 NOTE — Telephone Encounter (Signed)
Patient was notified about medication recommendations below. Patient endorsed understanding of changes.

## 2021-01-04 ENCOUNTER — Telehealth: Payer: Self-pay | Admitting: Family Medicine

## 2021-01-04 ENCOUNTER — Other Ambulatory Visit: Payer: Self-pay

## 2021-01-04 ENCOUNTER — Other Ambulatory Visit (INDEPENDENT_AMBULATORY_CARE_PROVIDER_SITE_OTHER): Payer: Medicare Other

## 2021-01-04 DIAGNOSIS — E039 Hypothyroidism, unspecified: Secondary | ICD-10-CM | POA: Diagnosis not present

## 2021-01-04 DIAGNOSIS — F411 Generalized anxiety disorder: Secondary | ICD-10-CM | POA: Diagnosis not present

## 2021-01-04 DIAGNOSIS — J439 Emphysema, unspecified: Secondary | ICD-10-CM | POA: Diagnosis not present

## 2021-01-04 DIAGNOSIS — E209 Hypoparathyroidism, unspecified: Secondary | ICD-10-CM | POA: Diagnosis not present

## 2021-01-04 DIAGNOSIS — K219 Gastro-esophageal reflux disease without esophagitis: Secondary | ICD-10-CM | POA: Diagnosis not present

## 2021-01-04 DIAGNOSIS — I10 Essential (primary) hypertension: Secondary | ICD-10-CM | POA: Diagnosis not present

## 2021-01-04 NOTE — Telephone Encounter (Signed)
Called the patient back and will speak to her son Lennette Bihari around 1 today to schedule. [phone].

## 2021-01-04 NOTE — Telephone Encounter (Signed)
When did she stop taking it? The last lab testing was normal in early Sept. If she is doing well off of it and that includes that time, OK, but if she stopped after that test, I want to recheck her labs. Ty.

## 2021-01-04 NOTE — Telephone Encounter (Signed)
Called the patient to inquire if she wanted her thyroid medication sent to [facility]. We have received a fax request She stated she is currently not taking her thyroid medication.

## 2021-01-04 NOTE — Telephone Encounter (Signed)
Called left message to call back, 

## 2021-01-04 NOTE — Telephone Encounter (Signed)
Let's schedule a lab visit and please order TSH and Free T4 and base decision on those results. Ty.

## 2021-01-04 NOTE — Telephone Encounter (Signed)
Orders entered Patient is scheduled today for labs

## 2021-01-04 NOTE — Telephone Encounter (Signed)
She has been off since being in the hospital in September. She stopped after coming home.

## 2021-01-05 ENCOUNTER — Telehealth: Payer: Self-pay | Admitting: Pharmacist

## 2021-01-05 LAB — T4, FREE: Free T4: <0.01 ng/dL — ABNORMAL LOW (ref 0.60–1.60)

## 2021-01-05 LAB — TSH: TSH: 178.18 u[IU]/mL — ABNORMAL HIGH (ref 0.35–5.50)

## 2021-01-05 NOTE — Telephone Encounter (Signed)
PCP office received fax from [facility] requesting Rx for levothyroxine. Looks like this a medication packaging company. Spoke with patients son Dellis Anes to confirm that patient was planning to get medications packaged and to facilitate sending prescriptions to [facility] if patient desires.  Her son Lennette Bihari verified with patient that she was unaware of requesting [facility] to package her medications and did not wish to start this service at this time.

## 2021-01-09 DIAGNOSIS — E039 Hypothyroidism, unspecified: Secondary | ICD-10-CM | POA: Diagnosis not present

## 2021-01-09 DIAGNOSIS — F411 Generalized anxiety disorder: Secondary | ICD-10-CM | POA: Diagnosis not present

## 2021-01-09 DIAGNOSIS — K219 Gastro-esophageal reflux disease without esophagitis: Secondary | ICD-10-CM | POA: Diagnosis not present

## 2021-01-09 DIAGNOSIS — E209 Hypoparathyroidism, unspecified: Secondary | ICD-10-CM | POA: Diagnosis not present

## 2021-01-09 DIAGNOSIS — I10 Essential (primary) hypertension: Secondary | ICD-10-CM | POA: Diagnosis not present

## 2021-01-09 DIAGNOSIS — J439 Emphysema, unspecified: Secondary | ICD-10-CM | POA: Diagnosis not present

## 2021-01-10 ENCOUNTER — Ambulatory Visit: Payer: Medicare Other | Admitting: Pharmacist

## 2021-01-10 DIAGNOSIS — E039 Hypothyroidism, unspecified: Secondary | ICD-10-CM

## 2021-01-10 DIAGNOSIS — J439 Emphysema, unspecified: Secondary | ICD-10-CM

## 2021-01-10 DIAGNOSIS — I2510 Atherosclerotic heart disease of native coronary artery without angina pectoris: Secondary | ICD-10-CM

## 2021-01-11 DIAGNOSIS — K5520 Angiodysplasia of colon without hemorrhage: Secondary | ICD-10-CM | POA: Diagnosis not present

## 2021-01-11 DIAGNOSIS — D509 Iron deficiency anemia, unspecified: Secondary | ICD-10-CM | POA: Diagnosis not present

## 2021-01-12 ENCOUNTER — Other Ambulatory Visit: Payer: Self-pay | Admitting: *Deleted

## 2021-01-12 DIAGNOSIS — Z87891 Personal history of nicotine dependence: Secondary | ICD-10-CM

## 2021-01-12 NOTE — Patient Instructions (Signed)
Visit Information  PATIENT GOALS:  Goals Addressed             This Visit's Progress    Pharmacy Care Plan and Goals       Current Barriers:  Unable to independently afford treatment regimen Unable to achieve control of COPD  Unable to adhere to prescribed medication regimen  Pharmacist Clinical Goal(s):  Over the next 90 days, patient will verbalize ability to afford treatment regimen achieve control of COPD as evidenced by decreased exacerbations and increased use of maintenance inhaler maintain control of elevated cholesterol as evidenced by LDL <70  adhere to prescribed medication regimen as evidenced by refill history through collaboration with PharmD and provider.   Interventions: 1:1 collaboration with Shelda Pal, DO regarding development and update of comprehensive plan of care as evidenced by provider attestation and co-signature Inter-disciplinary care team collaboration (see longitudinal plan of care) Comprehensive medication review performed; medication list updated in electronic medical record  Hyperlipidemia: Not at goal; LDL goal <70 - last LDL was 77  Current treatment: atorvastatin 40mg  daily  Interventions:   Educated on LDL goals Recommended patient restart / continue to take atorvastatin 40mg  daily  Recommended she limit intake of saturated and trans fat If LDL continues to be >70, consider increasing atorvastatin to 80mg  daily or switch to rosuvastatin 40mg  daily   Chronic Obstructive Pulmonary Disease: Uncontrolled but improving since started Trelegy:  Current treatment: Trelegy Inhaler 174mcg - inhale one puff into lungs once a day  Albuterol inhaler or nebulized solution use as needed up to every 6 hour for shortness of breath or wheezing.  Interventions: Reviewed maintenance versus rescue inhalers Continue to follow up with [name] Application for PAP for Trelegy has been approved. You should received delivery in the next 2  weeks.  Osteopenia / hypocalcemia / hypoparathyroidism / hypothyroidism / fatigue:  Managed by endocrinologist - [name]  Current treatment:  Calcium + vitamin D  Interventions:  Continue to follow with [name]  Consider rechecking DEXA  Medication Management:  Current pharmacy: [place] Patient has no current Part D coverage (medication coverage)  She is using a discount coupon which helps keep most of her medications at $12 or less per 90 days except Trelegy.  Interventions:  Used Medicare.gov to complete Part D (drug benefit) coverage search. Identified 3 possible plans for patient. Printed information and mailed to her - included monthly premium, deductible and estimated medication costs at [facility] for her current medication as well as phone numbers for each plan so patient to contact and enroll if interested.    Patient Goals/Self-Care Activities Over the next 90 days, patient will:  take medications as prescribed,  Focus on medication adherence by using pill container if needed Collaborate with provider on medication access solutions  Follow Up Plan: Telephone follow up appointment with care management team member scheduled for:  2 weeks               The patient verbalized understanding of instructions, educational materials, and care plan provided today and declined offer to receive copy of patient instructions, educational materials, and care plan.   Telephone follow up appointment with care management team member scheduled for: 2 weeks  [name], PharmD Clinical Pharmacist [facility]

## 2021-01-12 NOTE — Chronic Care Management (AMB) (Signed)
Chronic Care Management Pharmacy Note  01/12/2021 Name:  Andrea Ward MRN:  [id number] DOB:  1945/10/18  Subjective: Andrea Ward is an 75 y.o. year old female who is a primary patient of Shelda Pal, DO.  The CCM team was consulted for assistance with disease management and care coordination needs.    Engaged with patient by telephone for follow up visit Initial contact was started in response to provider referral for pharmacy case management and/or care coordination services.   Consent to Services:  The patient was given information about Chronic Care Management services, agreed to services, and gave verbal consent prior to initiation of services.  Please see initial visit note for detailed documentation.   Patient Care Team: Shelda Pal, DO as PCP - General (Family Medicine) Buford Dresser, MD as PCP - Cardiology (Cardiology) Juanito Doom, MD as Consulting Physician (Pulmonary Disease) [name], PharmD (Pharmacist) Garner Nash, DO as Consulting Physician (Pulmonary Disease)  Recent office visits: 12/09/2020 - PCP (Dr Nani Ravens) Seen for weakness and balance issues. Refreal for home health PT. Recommended Fiber Supplement for diarrhea 09/23/2020 - PCP - phone call - patient to pick up #4 samples of Trelegy 09/21/2020 - PCP (Dr Nani Ravens) F/U COPD. Prescribed prednisone 61m daily until she can get Trelegy. Prescribed meloxicam 7.544mdaily for osteoarthritis.  09/14/2020 - PCP (Dr WeNani RavensCOPD exacerbation; Rx prednisone 408md for 5 days; albuterol inhaler or nebs as needed  Recent consult visits: 01/07/2021 - Pulmonary (dr IcaValeta Harms/U severe COPD. Continue Trelegy 100m33maily - given sample. F/U in 6 months. 09/15/2020 - ophthalmology ([facility] - Dr. ForsTrinna Posttaract eval; no surgery for cataract removal at this time - monitoring;  09/13/2020 - Endo ([facility] - Dr PatePosey ProntoU post-surgical hypoparathyroidism and hypothyroidism. Repeat  TSH, T4, calcium and PTH; reflled calcitriol. Refill Synthroid.   [facility].diff [facility].  New medications at discharge: vancomysin 50mg36m- take 2.5mL =70m5mg e26m 6 hours for 8 days Medications changed at discharge: Increased levothyroxine to 100mcg d69m 6 days per week/ Sundays off.  Medications stopped at discharge: potassium chloride 20 mEq and propranolol 20mg  [id number] to [facility]14[facility]. Also has low magnesium, potassium and calcium.  Medications started at discharge: oral iron supplement 150mg twic64mday (Nu-Iron)  potassium supplement 20mEq once28may for 14 days acetaminophen 325mg - take49mablets = 650mg every 651mrs as needed Medication changes at discharge: increased magnesium oxide to 400mg twice a 66m   Objective:  Lab Results  Component Value Date   CREATININE 0.73 06/17/2020   CREATININE 0.78 02/10/2019   CREATININE 0.81 02/02/2019    No results found for: HGBA1C Last diabetic Eye exam: No results found for: HMDIABEYEEXA  Last diabetic Foot exam: No results found for: HMDIABFOOTEX      Component Value Date/Time   CHOL 139 06/17/2020 1158   TRIG 82.0 06/17/2020 1158   HDL 45.90 06/17/2020 1158   CHOLHDL 3 06/17/2020 1158   VLDL 16.4 06/17/2020 1158   LDLCALC 77 06/17/2020 1158    Hepatic Function Latest Ref Rng & Units 06/17/2020 02/10/2019 02/02/2019  Total Protein 6.0 - 8.3 g/dL 7.2 7.7 6.9  Albumin 3.5 - 5.2 g/dL 3.5 3.9 3.6  AST 0 - 37 U/L _0 ALT 0 - 35 U/L _1 Alk Phosphatase 39 -  117 U/L 67 62 57  Total Bilirubin 0.2 - 1.2 mg/dL 0.4 0.5 0.4  Bilirubin, Direct 0.1 - 0.5 mg/dL - - -    Lab Results  Component Value Date/Time   TSH 178.18 (H) 01/04/2021 03:17 PM   TSH 0.18 (L) 12/23/2019 09:07 AM   FREET4 <0.01 (L) 01/04/2021 03:17 PM   FREET4 1.5 12/23/2019 09:07 AM     CBC Latest Ref Rng & Units 02/26/2019 02/10/2019 02/02/2019  WBC 4.0 - 10.5 K/uL 7.2 7.6 6.4  Hemoglobin 12.0 - 15.0 g/dL 10.8(L) 9.7(L) 9.8(L)  Hematocrit 36.0 - 46.0 % 34.7(L) 31.8(L) 30.5(L)  Platelets 150 - 400 K/uL 318 396 442.0(H)    No results found for: VD25OH  Clinical ASCVD: Yes  The 10-year ASCVD risk score (Arnett DK, et al., 2019) is: 15.8%   Values used to calculate the score:     Age: 89 years     Sex: Female     Is Non-Hispanic African American: Yes     Diabetic: Yes     Tobacco smoker: No     Systolic Blood Pressure: 774 mmHg     Is BP treated: No     HDL Cholesterol: 45.9 mg/dL     Total Cholesterol: 139 mg/dL     Social History   Tobacco Use  Smoking Status Former   Packs/day: 0.75   Years: 50.00   Pack years: 37.50   Types: Cigarettes   Start date: 03/19/1965   Quit date: 11/10/2018   Years since quitting: 2.1  Smokeless Tobacco Never  Tobacco Comments   stopped smoknig august/2020!   BP Readings from Last 3 Encounters:  12/28/20 (!) 102/52  12/09/20 112/60  09/21/20 138/76   Pulse Readings from Last 3 Encounters:  12/28/20 (!) 103  12/09/20 81  09/21/20 79   Wt Readings from Last 3 Encounters:  12/28/20 105 lb 3.2 oz (47.7 kg)  12/09/20 98 lb 4 oz (44.6 kg)  09/21/20 118 lb 4 oz (53.6 kg)    Assessment: Review of patient past medical history, allergies, medications, health status, including review of consultants reports, laboratory and other test data, was performed as part of comprehensive evaluation and provision of chronic care management services.   SDOH:  (Social Determinants of Health) assessments and interventions performed:  SDOH Interventions    Flowsheet Row Most Recent Value  SDOH Interventions   Financial Strain Interventions Other (Comment)  [Called [employer] today - patient approved for Trelegy PAP and should receive shipment around 01/24/2021]       CCM Care Plan  No Known Allergies  Medications Reviewed Today      Reviewed by [name], PharmD (Pharmacist) on 01/10/21 at 1157  Med List Status: <None>   Medication Order Taking? Sig Documenting Provider Last Dose Status Informant  acetaminophen (TYLENOL) 325 MG tablet [id number] Yes Take 2 tablets by mouth every 6 (six) hours as needed. [provider] Taking Active   albuterol (PROVENTIL) (2.5 MG/3ML) 0.083% nebulizer solution [id number] Yes Take 3 mLs (2.5 mg total) by nebulization every 6 (six) hours as needed for wheezing or shortness of breath. Shelda Pal, DO Taking Active   atorvastatin (LIPITOR) 40 MG tablet [id number] No Take 1 tablet (40 mg total) by mouth daily.  Patient not taking: Reported on 01/10/2021   Shelda Pal, DO Not Taking Active   busPIRone (BUSPAR) 15 MG tablet [id number] Yes TAKE 1 TABLET BY MOUTH THREE TIMES DAILY Shelda Pal, DO Taking Active   calcitRIOL (Duck)  0.5 MCG capsule [id number] No Take 1 mcg by mouth daily.  Patient not taking: Reported on 01/10/2021   [provider] Not Taking Active   Calcium Carbonate Antacid (TUMS PO) [id number] No Take 1,000 mg by mouth 3 (three) times daily.  Patient not taking: Reported on 01/10/2021   [provider] Not Taking Active   cholecalciferol (VITAMIN D3) 25 MCG (1000 UNIT) tablet [id number] No Take 1,000 Units by mouth daily.  Patient not taking: Reported on 01/10/2021   [provider] Not Taking Active   Fluticasone-Umeclidin-Vilant (TRELEGY ELLIPTA) 100-62.5-25 MCG/INH AEPB [id number] Yes Inhale 1 puff into the lungs daily. Shelda Pal, DO Taking Active   guaifenesin (HUMIBID E) 400 MG TABS tablet [id number] No Take 400 mg by mouth every 6 (six) hours as needed.  Patient not taking: Reported on 01/10/2021   [provider] Not Taking Consider Medication Status and Discontinue   iron polysaccharides (NIFEREX) 150 MG capsule [id number] No Take 1 capsule by mouth in the morning and at  bedtime.  Patient not taking: Reported on 01/10/2021   [provider] Not Taking Active   levothyroxine (SYNTHROID) 75 MCG tablet [id number] Yes Take 1 tablet (75 mcg total) by mouth daily before breakfast. Shelda Pal, DO Taking Active            Med Note Antony Contras, Aylan Bayona B   Tue Jan 10, 2021 11:51 AM)    magnesium oxide (MAG-OX) 400 (240 Mg) MG tablet [id number] No Take 1 tablet by mouth 2 (two) times daily.  Patient not taking: Reported on 01/10/2021   [provider] Not Taking Consider Medication Status and Discontinue   VENTOLIN HFA 108 (90 Base) MCG/ACT inhaler [id number] Yes Inhale 1-2 puffs into the lungs every 6 (six) hours as needed for wheezing or shortness of breath. Shelda Pal, DO Taking Active             Patient Active Problem List   Diagnosis Date Noted   Physical deconditioning 12/09/2020   Balance problem 12/09/2020   Aortic atherosclerosis ([place])    Bilateral carotid artery stenosis 08/27/2019   Coronary artery calcification seen on CT scan 08/27/2019   Idiopathic hypoparathyroidism ([place]) 02/02/2019   Ischemic colitis ([place]) 02/02/2019   Medication management 01/05/2019   Healthcare maintenance 12/17/2018   Former smoker 04/14/2018   Pulmonary emphysema ([place]) 01/09/2018   Tremor 08/19/2017   Tachycardia 07/10/2017   Hypothyroid    Hypothyroidism 06/12/2016   Pharyngeal dysphagia 06/12/2016   Quality of voice, hoarse 06/12/2016   Goiter 05/23/2016   GERD (gastroesophageal reflux disease) 05/23/2016   Generalized anxiety disorder 05/23/2016    Immunization History  Administered Date(s) Administered   Moderna Sars-Covid-2 Vaccination 07/09/2019, 08/06/2019   Pneumococcal Conjugate-13 05/23/2016   Pneumococcal Polysaccharide-23 07/10/2017   Tdap 05/23/2016    Conditions to be addressed/monitored: CAD, HTN, HLD, COPD, and hypothyroidism; hypoparathyroidism, low calcium; anemia; AVMs; anxiety; emphysema / COPD, GAD;  joint pain, GERD  Care Plan : General Pharmacy (Adult)  Updates made by [name], PHARMD since 01/12/2021 12:00 AM     Problem: Emphysema / COPD; HDL; CAD / coronary artery calcium; hypothyroidism; hypoparathyrroidism; tremor; GAD; GERD; joint pain   Priority: High     Long-Range Goal: Pharmacy goals related to chronic care and medication management   Start Date: 09/29/2020  Priority: High  Note:   Current Barriers:  Unable to independently afford treatment regimen Unable to achieve control of COPD  Does not adhere to prescribed medication  regimen Recent hospitalization with medication changes. Patient education needed  Pharmacist Clinical Goal(s):  Over the next 90 days, patient will verbalize ability to afford treatment regimen achieve control of COPD as evidenced by decreased exacerbations and increased use of maintenance inhaler maintain control of HDL as evidenced by LDL <70  adhere to prescribed medication regimen as evidenced by refill history  through collaboration with PharmD and provider.   Interventions: 1:1 collaboration with Shelda Pal, DO regarding development and update of comprehensive plan of care as evidenced by provider attestation and co-signature Inter-disciplinary care team collaboration (see longitudinal plan of care) Comprehensive medication review performed; medication list updated in electronic medical record  Hyperlipidemia: Not at goal; LDL goal <70 - last LDL was 77  Noted to have carotid artery stenosis, aortic atherosclerosis and positive coronary artery calcium on CT scan  Current treatment: atorvastatin 57m daily  Medications previously tried: none  Interventions: (addressed at previous visit)  Educated on LDL goals Recommended patient continue to take atorvastatin 415mdaily  Recommended she limit intake of saturated and trans fat If LDL continues to be >70, consider increasing atorvastatin to 8028maily or switch to  rosuvastatin 2m76mily   Chronic Obstructive Pulmonary Disease: Uncontrolled but improving since started Trelegy.  Goal: reduce shortness of breath / wheezing and prevent exacerbations Managed by Dr IkerNemiah Commanderlmonologist with [facility] treatment:  Trelegy Inhaler 100mc97minhale one puff into lungs once a day Albuterol inhaler or nebs - use up to every 6 hours as needed for shortness of breath / wheezine.   Use of maintenance inhaler: patient endorses she is using every day Use of rescue inhaler: using once a day GOLD Classification: D MMRC/CAT score: 18 1 exacerbations requiring treatment in the last 6 months  Interventions: Reviewed maintenance versus rescue inhalers Trelegy application completed and faxed along with prescription to manufacturer assistance program at last visit. Verified with [employer] that patient has been approved for PAP through 01/16/2022. Shipped 01/11/2021 - should arrive no later than 01/24/2021 Patient endorses she has about 1 months of Trelegy samples.  Continue to follow up with pulmonology   Anemia due to blood loss:  Past hospitalization in 2022 with HBG of 4.1 on admission. 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  Current Treatment:  None  Interventions: Patient instructed to hold meloxicam until GI follow up (no longer taking) Noted that patient has referral pending for GI follow up and for capsule endoscopy  Osteopenia / hypocalcemia / hypoparathyroidism / hypothyroidism:  Managed by endocrinologist - [facility] she is feeling stronger and fatigue improving. Last TSH was elevated 10/20/222 - patient has stopped levothyroxine DEXA 05/21/2016: Femur Total Left T-score -2.1. AP Spine L1-L4 T-Score -1.9  Frax estimate: Major Osteoporotic Fracture: 8.4% / Hip Fracture: 2.0% Current treatment:  Calcium + D daily  Levothyroxine 75mcg10make 1 tablet daily 6 days per week,  none on Sundays. (Restarted 01/05/2021) Interventions: (addressed at previous visits)  Continue to follow with Dr Patel Posey Prontoding hypoparathyroidism and hypocalcemia.  Reminded patient to take levothyroxine every day Will have TFT rechecked in 6 weeks.  Consider rechecking DEXA  Medication Management:  [place] [facility] [facility] has no current Part D coverage - confirmed with [facility] She is using a discount coupon which helps keep most of her medications at $12 or less per 90 days except Trelegy.  Interventions: (addressed at previous appointment)  Used Medicare.gov to  complete Part D (drug benefit) coverage search. Identified 3 possible plans for patient. Printed information and mailed to her - included monthly premium, deductible and estimated medication costs at [facility] for her current medication as well as phone numbers for each plan so patient to contact and enroll if interested. (Done at previous visit)     Patient Goals/Self-Care Activities Over the next 90 days, patient will:  take medications as prescribed, focus on medication adherence by using pill container if needed, and collaborate with provider on medication access solutions  Follow Up Plan: Telephone follow up appointment with care management team member scheduled for:  2 weeks to check medication adherence         Medication Assistance:  Trelegy obtained through [place] medication assistance program.  Enrollment ends 01/16/2022  Confirmed today patient was approved 01/11/2021 thru 01/16/2022 - should received first delivery before 01/24/2021.   Patient's preferred pharmacy is:  [place], [place] [address] Phone: [phone] Fax: [phone]   Follow Up:  Patient agrees to Care Plan and Follow-up.  Plan: Telephone follow up appointment with care management team member scheduled for:  2 weeks  [name], PharmD Clinical  Pharmacist [facility]

## 2021-01-13 DIAGNOSIS — I10 Essential (primary) hypertension: Secondary | ICD-10-CM | POA: Diagnosis not present

## 2021-01-13 DIAGNOSIS — E039 Hypothyroidism, unspecified: Secondary | ICD-10-CM | POA: Diagnosis not present

## 2021-01-13 DIAGNOSIS — K219 Gastro-esophageal reflux disease without esophagitis: Secondary | ICD-10-CM | POA: Diagnosis not present

## 2021-01-13 DIAGNOSIS — F411 Generalized anxiety disorder: Secondary | ICD-10-CM | POA: Diagnosis not present

## 2021-01-13 DIAGNOSIS — E209 Hypoparathyroidism, unspecified: Secondary | ICD-10-CM | POA: Diagnosis not present

## 2021-01-13 DIAGNOSIS — J439 Emphysema, unspecified: Secondary | ICD-10-CM | POA: Diagnosis not present

## 2021-01-16 DIAGNOSIS — I2510 Atherosclerotic heart disease of native coronary artery without angina pectoris: Secondary | ICD-10-CM

## 2021-01-16 DIAGNOSIS — J439 Emphysema, unspecified: Secondary | ICD-10-CM

## 2021-01-16 DIAGNOSIS — E039 Hypothyroidism, unspecified: Secondary | ICD-10-CM

## 2021-01-17 DIAGNOSIS — F411 Generalized anxiety disorder: Secondary | ICD-10-CM | POA: Diagnosis not present

## 2021-01-17 DIAGNOSIS — K219 Gastro-esophageal reflux disease without esophagitis: Secondary | ICD-10-CM | POA: Diagnosis not present

## 2021-01-17 DIAGNOSIS — I10 Essential (primary) hypertension: Secondary | ICD-10-CM | POA: Diagnosis not present

## 2021-01-17 DIAGNOSIS — E209 Hypoparathyroidism, unspecified: Secondary | ICD-10-CM | POA: Diagnosis not present

## 2021-01-17 DIAGNOSIS — J439 Emphysema, unspecified: Secondary | ICD-10-CM | POA: Diagnosis not present

## 2021-01-17 DIAGNOSIS — E039 Hypothyroidism, unspecified: Secondary | ICD-10-CM | POA: Diagnosis not present

## 2021-01-19 ENCOUNTER — Ambulatory Visit (HOSPITAL_BASED_OUTPATIENT_CLINIC_OR_DEPARTMENT_OTHER): Payer: Medicare Other

## 2021-01-22 DIAGNOSIS — E209 Hypoparathyroidism, unspecified: Secondary | ICD-10-CM | POA: Diagnosis not present

## 2021-01-22 DIAGNOSIS — K219 Gastro-esophageal reflux disease without esophagitis: Secondary | ICD-10-CM | POA: Diagnosis not present

## 2021-01-22 DIAGNOSIS — E039 Hypothyroidism, unspecified: Secondary | ICD-10-CM | POA: Diagnosis not present

## 2021-01-22 DIAGNOSIS — F411 Generalized anxiety disorder: Secondary | ICD-10-CM | POA: Diagnosis not present

## 2021-01-22 DIAGNOSIS — I10 Essential (primary) hypertension: Secondary | ICD-10-CM | POA: Diagnosis not present

## 2021-01-22 DIAGNOSIS — J439 Emphysema, unspecified: Secondary | ICD-10-CM | POA: Diagnosis not present

## 2021-01-22 DIAGNOSIS — I700 Atherosclerosis of aorta: Secondary | ICD-10-CM | POA: Diagnosis not present

## 2021-01-24 DIAGNOSIS — J439 Emphysema, unspecified: Secondary | ICD-10-CM | POA: Diagnosis not present

## 2021-01-24 DIAGNOSIS — E039 Hypothyroidism, unspecified: Secondary | ICD-10-CM | POA: Diagnosis not present

## 2021-01-24 DIAGNOSIS — K219 Gastro-esophageal reflux disease without esophagitis: Secondary | ICD-10-CM | POA: Diagnosis not present

## 2021-01-24 DIAGNOSIS — I10 Essential (primary) hypertension: Secondary | ICD-10-CM | POA: Diagnosis not present

## 2021-01-24 DIAGNOSIS — E209 Hypoparathyroidism, unspecified: Secondary | ICD-10-CM | POA: Diagnosis not present

## 2021-01-24 DIAGNOSIS — F411 Generalized anxiety disorder: Secondary | ICD-10-CM | POA: Diagnosis not present

## 2021-01-26 ENCOUNTER — Ambulatory Visit (HOSPITAL_BASED_OUTPATIENT_CLINIC_OR_DEPARTMENT_OTHER)
Admission: RE | Admit: 2021-01-26 | Discharge: 2021-01-26 | Disposition: A | Discharge status: Home / Self Care | Payer: Medicare Other | Source: Ambulatory Visit | Attending: Acute Care | Admitting: Acute Care

## 2021-01-26 ENCOUNTER — Other Ambulatory Visit: Payer: Self-pay

## 2021-01-26 DIAGNOSIS — Z87891 Personal history of nicotine dependence: Secondary | ICD-10-CM | POA: Diagnosis not present

## 2021-01-26 DIAGNOSIS — F17210 Nicotine dependence, cigarettes, uncomplicated: Secondary | ICD-10-CM | POA: Diagnosis not present

## 2021-01-27 ENCOUNTER — Other Ambulatory Visit: Payer: Self-pay | Admitting: Acute Care

## 2021-01-27 ENCOUNTER — Ambulatory Visit (INDEPENDENT_AMBULATORY_CARE_PROVIDER_SITE_OTHER): Payer: Medicare Other | Admitting: Pharmacist

## 2021-01-27 ENCOUNTER — Telehealth: Payer: Self-pay | Admitting: Acute Care

## 2021-01-27 DIAGNOSIS — J441 Chronic obstructive pulmonary disease with (acute) exacerbation: Secondary | ICD-10-CM

## 2021-01-27 DIAGNOSIS — E039 Hypothyroidism, unspecified: Secondary | ICD-10-CM

## 2021-01-27 DIAGNOSIS — R911 Solitary pulmonary nodule: Secondary | ICD-10-CM

## 2021-01-27 DIAGNOSIS — D509 Iron deficiency anemia, unspecified: Secondary | ICD-10-CM

## 2021-01-27 NOTE — Telephone Encounter (Signed)
Call returned to [facility] radiology: Call report:   IMPRESSION: 1. Lung-RADS 4B, suspicious. Additional imaging evaluation or consultation with Pulmonology or Thoracic Surgery recommended. New left lower lobe pulmonary nodule of volume derived equivalent diameter 10.9 mm. 2. No thoracic adenopathy. 3. Aortic Atherosclerosis (ICD10-I70.0) and Emphysema (ICD10-J43.9). Coronary artery atherosclerosis.   These results will be called to the ordering clinician or representative by the Radiologist Assistant, and communication documented in the PACS or [employer].     Electronically Signed   By: Abigail Miyamoto M.D.   On: 01/27/2021 12:11  SG please advise. Thanks :)

## 2021-01-27 NOTE — Chronic Care Management (AMB) (Signed)
Addendeum: patient called back with question about Trelegy dose. She has samples of Trelegy 238mcg but what she received from [place] patient assistance was for 136mcg. Reviewed past notes. Patient should use Trelegy 153mcg once daily. She was given the 276mcg as those were the only samples available to the time. Patient voiced understanding.

## 2021-01-27 NOTE — Patient Instructions (Addendum)
Andrea Ward,  It was a pleasure speaking with you today.  I have attached a summary of our visit today and information about your health goals.  If you have any questions or concerns, please feel free to contact me either at the phone number below or with a MyChart message.   Keep up the good work!  Andrea Ward, PharmD Clinical Pharmacist [facility] [phone] (direct line)  [phone] (main office number)  Care Plan and Goals:   Hyperlipidemia (high cholesterol): Not at goal; LDL goal <70 - last LDL was 77  Current treatment: atorvastatin 40mg  daily  Interventions:   Educated on LDL goals Recommended patient restart / continue to take atorvastatin 40mg  daily  Recommended she limit intake of saturated and trans fat  Chronic Obstructive Pulmonary Disease: Uncontrolled but improving since started Trelegy:  Current treatment: Trelegy Inhaler 153mcg - inhale one puff into lungs once a day  Albuterol inhaler or nebulized solution use as needed up to every 6 hour for shortness of breath or wheezing.  Interventions: Reviewed maintenance versus rescue inhalers Continue to follow up with Andrea Ward Can reorder Trelegy after 03/15/2021 - I will check in to see if you need any assistance.   Osteopenia / hypocalcemia / hypoparathyroidism / hypothyroidism / fatigue:  Managed by endocrinologist - Andrea Ward  Current treatment:  Calcium + vitamin D  Interventions:  Continue to follow with Andrea Ward  Consider rechecking DEXA  Anemia (low hemoglobin or iron) due to blood loss:  Current Treatment:  Polysaccharide Iron (Niferex) daily (restarted about 1 month ago)  Interventions: Patient instructed to avoid meloxicam and all other NSAIDS (ibuprofen/ Advil or Motrin and naproxen / Aleve) . If having pain, headache or fever can use acetaminophen 325mg  - up to 2 tablets every 6 hours.    Medication Management:  Current pharmacy: [facility] Patient has no  current Part D coverage (medication coverage)  She is using a discount coupon which helps keep most of her medications at $12 or less per 90 days except Trelegy.  Interventions:  Used Medicare.gov to complete Part D (drug benefit) coverage search. Identified 3 possible plans for patient. Printed information and mailed to her - included monthly premium, deductible and estimated medication costs at [facility] for her current medication as well as phone numbers for each plan so patient to contact and enroll if interested.    Patient Goals/Self-Care Activities Over the next 90 days, patient will:  take medications as prescribed,  Focus on medication adherence by using pill container if needed Collaborate with provider on medication access solutions  Follow Up Plan: Telephone follow up appointment with care management team member scheduled for:  January 2023     The patient verbalized understanding of instructions, educational materials, and care plan provided today and agreed to receive a mailed copy of patient instructions, educational materials, and care plan.   Telephone follow up appointment with care management team member scheduled for: January 2023  Andrea Ward, PharmD Clinical Pharmacist [place] [facility]

## 2021-01-27 NOTE — Chronic Care Management (AMB) (Signed)
Chronic Care Management Pharmacy Note  01/27/2021 Name:  Andrea Ward MRN:  [id number] DOB:  1945/04/20  Subjective: Andrea Ward is an 75 y.o. year old female who is a primary patient of Andrea Pal, DO.  The CCM team was consulted for assistance with disease management and care coordination needs.    Engaged with patient by telephone for follow up visit Initial contact was started in response to provider referral for pharmacy case management and/or care coordination services.   Consent to Services:  The patient was given information about Chronic Care Management services, agreed to services, and gave verbal consent prior to initiation of services.  Please see initial visit note for detailed documentation.   Patient Care Team: Andrea Pal, DO as PCP - General (Family Medicine) Andrea Dresser, MD as PCP - Cardiology (Cardiology) Andrea Doom, MD as Consulting Physician (Pulmonary Disease) Andrea Ward, RPH-CPP (Pharmacist) Andrea Nash, DO as Consulting Physician (Pulmonary Disease)  Recent office visits: 12/09/2020 - PCP (Dr Nani Ravens) Seen for weakness and balance issues. Refreal for home health PT. Recommended Fiber Supplement for diarrhea 09/23/2020 - PCP - phone call - patient to pick up #4 samples of Trelegy 09/21/2020 - PCP (Dr Nani Ravens) F/U COPD. Prescribed prednisone 33m daily until she can get Trelegy. Prescribed meloxicam 7.547mdaily for osteoarthritis.  09/14/2020 - PCP (Dr WeNani RavensCOPD exacerbation; Rx prednisone 4064md for 5 days; albuterol inhaler or nebs as needed  Recent consult visits: 01/16/2021 - [facility]. SBCE shows a few scattered AVMs in the SB ,no active bleeding. Needs Iron replacement per PCP, if cannot tolerate PO Iron then will need IV Iron 01/07/2021 - Pulmonary (dr IcaValeta Harms/U severe COPD. Continue Trelegy 100m68maily - given sample. F/U in 6 months. 09/15/2020 -  ophthalmology ([facility] - Dr. ForsTrinna Posttaract eval; no surgery for cataract removal at this time - monitoring;  09/13/2020 - Endo ([facility] - Dr PatePosey ProntoU post-surgical hypoparathyroidism and hypothyroidism. Repeat TSH, T4, calcium and PTH; reflled calcitriol. Refill Synthroid.   [facility].  New medications at discharge: vancomysin 50mg45m- take 2.5mL =15m5mg e88m 6 hours for 8 days Medications changed at discharge: Increased levothyroxine to 100mcg d25m 6 days per week/ Sundays off.  Medications stopped at discharge: potassium chloride 20 mEq and propranolol 20mg  0839m2022 to 11/14/2020 [facility] servere [facility]. Also has low magnesium, potassium and calcium.  Medications started at discharge: oral iron supplement 150mg twic93mday (Nu-Iron)  potassium supplement 20mEq once52may for 14 days acetaminophen 325mg - take36mablets = 650mg every 626mrs as needed Medication changes at discharge: increased magnesium oxide to 400mg twice a 30m   Objective:  Lab Results  Component Value Date   CREATININE 0.73 06/17/2020   CREATININE 0.78 02/10/2019   CREATININE 0.81 02/02/2019    No results found for: HGBA1C Last diabetic Eye exam: No results found for: HMDIABEYEEXA  Last diabetic Foot exam: No results found for: HMDIABFOOTEX      Component Value Date/Time   CHOL 139 06/17/2020 1158   TRIG 82.0 06/17/2020 1158   HDL 45.90 06/17/2020 1158   CHOLHDL 3 06/17/2020 1158   VLDL 16.4 06/17/2020 1158   LDLCALC 77 06/17/2020 1158    Hepatic Function Latest Ref Rng & Units 06/17/2020 02/10/2019 02/02/2019  Total Protein 6.0 -  8.3 g/dL 7.2 7.7 6.9  Albumin 3.5 - 5.2 g/dL 3.5 3.9 3.6  AST 0 - 37 U/L 12 15 16   ALT 0 - 35 U/L 5 5 5   Alk Phosphatase 39 - 117 U/L 67 62 57  Total Bilirubin 0.2 - 1.2 mg/dL 0.4 0.5 0.4  Bilirubin, Direct 0.1 - 0.5  mg/dL - - -    Lab Results  Component Value Date/Time   TSH 178.18 (H) 01/04/2021 03:17 PM   TSH 0.18 (L) 12/23/2019 09:07 AM   FREET4 <0.01 (L) 01/04/2021 03:17 PM   FREET4 1.5 12/23/2019 09:07 AM    CBC Latest Ref Rng & Units 02/26/2019 02/10/2019 02/02/2019  WBC 4.0 - 10.5 K/uL 7.2 7.6 6.4  Hemoglobin 12.0 - 15.0 g/dL 10.8(L) 9.7(L) 9.8(L)  Hematocrit 36.0 - 46.0 % 34.7(L) 31.8(L) 30.5(L)  Platelets 150 - 400 K/uL 318 396 442.0(H)    No results found for: VD25OH  Clinical ASCVD: Yes  The 10-year ASCVD risk score (Arnett DK, et al., 2019) is: 15.8%   Values used to calculate the score:     Age: 67 years     Sex: Female     Is Non-Hispanic African American: Yes     Diabetic: Yes     Tobacco smoker: No     Systolic Blood Pressure: 182 mmHg     Is BP treated: No     HDL Cholesterol: 45.9 mg/dL     Total Cholesterol: 139 mg/dL     Social History   Tobacco Use  Smoking Status Former   Packs/day: 0.75   Years: 50.00   Pack years: 37.50   Types: Cigarettes   Start date: 03/19/1965   Quit date: 11/10/2018   Years since quitting: 2.2  Smokeless Tobacco Never  Tobacco Comments   stopped smoknig august/2020!   BP Readings from Last 3 Encounters:  12/28/20 (!) 102/52  12/09/20 112/60  09/21/20 138/76   Pulse Readings from Last 3 Encounters:  12/28/20 (!) 103  12/09/20 81  09/21/20 79   Wt Readings from Last 3 Encounters:  12/28/20 105 lb 3.2 oz (47.7 kg)  12/09/20 98 lb 4 oz (44.6 kg)  09/21/20 118 lb 4 oz (53.6 kg)    Assessment: Review of patient past medical history, allergies, medications, health status, including review of consultants reports, laboratory and other test data, was performed as part of comprehensive evaluation and provision of chronic care management services.   SDOH:  (Social Determinants of Health) assessments and interventions performed:  SDOH Interventions    Flowsheet Row Most Recent Value  SDOH Interventions   Physical Activity  Interventions Other (Comments)  [patient continues to do exercises provided by physical therapy]  Transportation Interventions Other (Comment)  [patient has family and friends that assist with transportation]       CCM Care Plan  No Known Allergies  Medications Reviewed Today     Reviewed by Andrea Ward, RPH-CPP (Pharmacist) on 01/27/21 at [place]  Med List Status: <None>   Medication Order Taking? Sig Documenting Provider Last Dose Status Informant  acetaminophen (TYLENOL) 325 MG tablet [id number] Yes Take 2 tablets by mouth every 6 (six) hours as needed. [provider] Taking Active   albuterol (PROVENTIL) (2.5 MG/3ML) 0.083% nebulizer solution [id number] Yes Take 3 mLs (2.5 mg total) by nebulization every 6 (six) hours as needed for wheezing or shortness of breath. Andrea Pal, DO Taking Active   atorvastatin (LIPITOR) 40 MG tablet [id number] Yes Take 1 tablet (40 mg total)  by mouth daily. Andrea Pal, DO Taking Active   busPIRone (BUSPAR) 15 MG tablet [id number] Yes TAKE 1 TABLET BY MOUTH THREE TIMES DAILY Nani Ravens Crosby Oyster, DO Taking Active   calcitRIOL (ROCALTROL) 0.5 MCG capsule [id number] Yes Take 1 mcg by mouth daily. [provider] Taking Active   Calcium Carbonate Antacid (TUMS PO) [id number] Yes Take 1,000 mg by mouth 3 (three) times daily. [provider] Taking Active   cholecalciferol (VITAMIN D3) 25 MCG (1000 UNIT) tablet [id number] Yes Take 1,000 Units by mouth daily. [provider] Taking Active   Fluticasone-Umeclidin-Vilant (TRELEGY ELLIPTA) 100-62.5-25 MCG/INH AEPB [id number] Yes Inhale 1 puff into the lungs daily. Andrea Pal, DO Taking Active   guaifenesin (HUMIBID E) 400 MG TABS tablet [id number] No Take 400 mg by mouth every 6 (six) hours as needed.  Patient not taking: No sig reported   [provider] Not Taking Active   iron polysaccharides (NIFEREX) 150 MG capsule [id number] Yes Take  1 capsule by mouth in the morning and at bedtime. [provider] Taking Active   levothyroxine (SYNTHROID) 75 MCG tablet [id number] Yes Take 1 tablet (75 mcg total) by mouth daily before breakfast. Andrea Pal, DO Taking Active            Med Note Antony Contras, Borghild Thaker B   Tue Jan 10, 2021 11:51 AM)    VENTOLIN HFA 108 (90 Base) MCG/ACT inhaler [id number] Yes Inhale 1-2 puffs into the lungs every 6 (six) hours as needed for wheezing or shortness of breath. Andrea Pal, DO Taking Active             Patient Active Problem List   Diagnosis Date Noted   Physical deconditioning 12/09/2020   Balance problem 12/09/2020   Aortic atherosclerosis ([place])    Bilateral carotid artery stenosis 08/27/2019   Coronary artery calcification seen on CT scan 08/27/2019   Idiopathic hypoparathyroidism ([place]) 02/02/2019   Ischemic colitis ([place]) 02/02/2019   Medication management 01/05/2019   Healthcare maintenance 12/17/2018   Former smoker 04/14/2018   Pulmonary emphysema ([place]) 01/09/2018   Tremor 08/19/2017   Tachycardia 07/10/2017   Hypothyroid    Hypothyroidism 06/12/2016   Pharyngeal dysphagia 06/12/2016   Quality of voice, hoarse 06/12/2016   Goiter 05/23/2016   GERD (gastroesophageal reflux disease) 05/23/2016   Generalized anxiety disorder 05/23/2016    Immunization History  Administered Date(s) Administered   Moderna Sars-Covid-2 Vaccination 07/09/2019, 08/06/2019   Pneumococcal Conjugate-13 05/23/2016   Pneumococcal Polysaccharide-23 07/10/2017   Tdap 05/23/2016    Conditions to be addressed/monitored: CAD, HTN, HLD, COPD, and hypothyroidism; hypoparathyroidism, low calcium; anemia; AVMs; anxiety; emphysema / COPD, GAD; joint pain, GERD  Care Plan : General Pharmacy (Adult)  Updates made by Andrea Ward, RPH-CPP since 01/27/2021 12:00 AM     Problem: Emphysema / COPD; HDL; CAD / coronary artery calcium; hypothyroidism; hypoparathyrroidism; tremor; GAD;  GERD; joint pain   Priority: High     Long-Range Goal: Pharmacy goals related to chronic care and medication management   Start Date: 09/29/2020  This Visit's Progress: On track  Priority: High  Note:   Current Barriers:  Unable to independently afford treatment regimen Unable to achieve control of COPD  Does not adhere to prescribed medication regimen Recent hospitalization with medication changes. Patient education needed (completed)   Pharmacist Clinical Goal(s):  Over the next 90 days, patient will verbalize ability to afford treatment regimen achieve control of COPD as evidenced by decreased exacerbations and increased use of  maintenance inhaler maintain control of HDL as evidenced by LDL <70  adhere to prescribed medication regimen as evidenced by refill history  through collaboration with PharmD and provider.   Interventions: 1:1 collaboration with Andrea Pal, DO regarding development and update of comprehensive plan of care as evidenced by provider attestation and co-signature Inter-disciplinary care team collaboration (see longitudinal plan of care) Comprehensive medication review performed; medication list updated in electronic medical record  Hyperlipidemia: Not at goal; LDL goal <70 - last LDL was 77  Noted to have carotid artery stenosis, aortic atherosclerosis and positive coronary artery calcium on CT scan  Current treatment: atorvastatin 36m daily  Medications previously tried: none  Interventions: (addressed at previous visit)  Educated on LDL goals Recommended patient continue to take atorvastatin 457mdaily  Recommended she limit intake of saturated and trans fat If LDL continues to be >70, consider increasing atorvastatin to 8095maily or switch to rosuvastatin 34m26mily   Chronic Obstructive Pulmonary Disease: Improved since started Trelegy.  Goal: reduce shortness of breath / wheezing and prevent exacerbations Managed by Dr IkerNemiah Commanderulmonologist with [facility]:  Trelegy Inhaler 100mc44minhale one puff into lungs once a day Albuterol inhaler or nebs - use up to every 6 hours as needed for shortness of breath / wheezine.   Use of maintenance inhaler: patient endorses she is using every day Use of rescue inhaler: using once a day GOLD Classification: D MMRC/CAT score: 18 1 exacerbations requiring treatment in the last 6 months  Interventions: Reviewed maintenance versus rescue inhalers COnfirmed patient received 3 months supply of Trelegy on 01/20/2021. Will be able to reorder after 03/15/2021. Will reach back out to patient in 6 to 8 weeks to assist with reordering Trelegy from medication assistance program. She is approved through 12/2021 (will need updated Rx in October 2023) Continue to follow up with pulmonology (refill line for [employer] [facility] assistance program [phone]#: [id number] due to blood loss:  Past hospitalization in 2022 with HBG of 4.1 on admission. 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  Had capsule endoscopy recently - no actively bleeding AVMs noted per GI report Current Treatment:  Polysaccharide Iron (Niferex) daily (restarted about 1 month ago)  Interventions: Patient instructed to avoid meloxicam and all other NSAIDS. If having pain, headache or fever can use acetaminophen 325mg 60m to 2 tablets every 6 hours.  Noted that patient has referral pending for GI follow up and for capsule endoscopy  Osteopenia / hypocalcemia / hypoparathyroidism / hypothyroidism:  [facility]in[facility] is feeling stronger and fatigue improving. Last TSH was elevated 10/20/222 - patient has stopped levothyroxine DEXA 05/21/2016: Femur Total Left T-score -2.1. AP Spine L1-L4 T-Score -1.9  Frax estimate: Major Osteoporotic Fracture: 8.4% / Hip Fracture: 2.0% Current treatment:  Calcium  carbonate 1000mg 37mes a day Vitamin D3 - 1000 units daily Calcitriol 0.5mcg da56m Levothyroxine 75mcg - 23m 1 tablet daily 6 days per week, none on Sundays. (Restarted 01/05/2021) Interventions: (addressed at previous visits)  Continue to follow with Dr Patel regPosey Prontog hypoparathyroidism and hypocalcemia.  Reminded patient to take levothyroxine every day Will have TFT rechecked in 6 weeks.  Consider rechecking DEXA  Medication Management:  [facility] [place] [facility] She is using a discount coupon which helps keep most of her medications at $12 or  less per 90 days except Trelegy.  Interventions: (addressed at previous appointment)  Used Medicare.gov to complete Part D (drug benefit) coverage search. Identified 3 possible plans for patient. Printed information and mailed to her - included monthly premium, deductible and estimated medication costs at [facility] for her current medication as well as phone numbers for each plan so patient to contact and enroll if interested. (Done at previous visit)  Discussed medications and dosing.     Patient Goals/Self-Care Activities Over the next 90 days, patient will:  take medications as prescribed, focus on medication adherence by using pill container if needed, and collaborate with provider on medication access solutions  Follow Up Plan: Telephone follow up appointment with care management team member scheduled for:  2 months to check medication adherence         Medication Assistance:  Trelegy obtained through [facility] medication assistance program.  Enrollment ends 01/16/2022  Confirmed today patient was approved 01/11/2021 thru 01/16/2022 - first delivery received around 01/20/2021.   Patient's preferred pharmacy is:  [facility], [place] [address] Phone: [phone] Fax: [phone]   Follow Up:   Patient agrees to Care Plan and Follow-up.  Plan: Telephone follow up appointment with care management team member scheduled for:  2 months  Andrea Ward, PharmD Clinical Pharmacist [facility] [place] [facility]

## 2021-01-27 NOTE — Progress Notes (Signed)
I have called the patient with the results of her low dose CT . I explained that her scan was read as a Lung RADS 4 B which  indicates suspicious findings for which additional diagnostic testing and or tissue sampling is recommended. I told her there is notation of a New left lower lobe pulmonary nodule of volume derived equivalent diameter 10.9 mm.that we need to take a closer look at.I did review this scan with Dr. Valeta Harms. Plan is for a PET scan with follow up with Dr. Valeta Harms of myself in 3 weeks after the PET has been completed. She is in agreement with this plan.  I will order the PET scan. Triage, please schedule patient with Dr. Valeta Harms as follow up in 3 weeks in a nodule slot  which should allow for PET scan to get done beforehand. If PET is not done within 3 weeks this will need to be rescheduled.  Thanks so much. I will place PET order.

## 2021-01-27 NOTE — Telephone Encounter (Signed)
Magdalen Spatz, NP to Vivia Ewing, LPN  Christie Beckers, RN  Joella Prince, RN  [facility] Triage Pool     3:18 PM  I have called the patient with the results of her low dose CT . I explained that her scan was read as a Lung RADS 4 B which  indicates suspicious findings for which additional diagnostic testing and or tissue sampling is recommended. I told her there is notation of a New left lower lobe pulmonary nodule of volume derived equivalent diameter 10.9 mm.that we need to take a closer look at.I did review this scan with Dr. Valeta Harms. Plan is for a PET scan with follow up with Dr. Valeta Harms of myself in 3 weeks after the PET has been completed. She is in agreement with this plan.  I will order the PET scan.  Triage, please schedule patient with Dr. Valeta Harms as follow up in 3 weeks in a nodule slot  which should allow for PET scan to get done beforehand. If PET is not done within 3 weeks this will need to be rescheduled.  Thanks so much. I will place PET order.    Spoke with the pt's son and scheduled ov with BI for 02/22/21

## 2021-01-30 NOTE — Telephone Encounter (Signed)
Spoke with pt's son Lennette Bihari and gave f/u appt info with Dr Valeta Harms for 02/15/21 1:45. He verbalized understanding. Nothing further needed.

## 2021-02-03 DIAGNOSIS — E039 Hypothyroidism, unspecified: Secondary | ICD-10-CM | POA: Diagnosis not present

## 2021-02-03 DIAGNOSIS — J439 Emphysema, unspecified: Secondary | ICD-10-CM | POA: Diagnosis not present

## 2021-02-03 DIAGNOSIS — I10 Essential (primary) hypertension: Secondary | ICD-10-CM | POA: Diagnosis not present

## 2021-02-03 DIAGNOSIS — E209 Hypoparathyroidism, unspecified: Secondary | ICD-10-CM | POA: Diagnosis not present

## 2021-02-03 DIAGNOSIS — K219 Gastro-esophageal reflux disease without esophagitis: Secondary | ICD-10-CM | POA: Diagnosis not present

## 2021-02-03 DIAGNOSIS — F411 Generalized anxiety disorder: Secondary | ICD-10-CM | POA: Diagnosis not present

## 2021-02-08 ENCOUNTER — Encounter (HOSPITAL_COMMUNITY)
Admission: RE | Admit: 2021-02-08 | Discharge: 2021-02-08 | Disposition: A | Discharge status: Home / Self Care | Payer: Medicare Other | Source: Ambulatory Visit | Attending: Acute Care | Admitting: Acute Care

## 2021-02-08 ENCOUNTER — Other Ambulatory Visit: Payer: Self-pay

## 2021-02-08 DIAGNOSIS — R911 Solitary pulmonary nodule: Secondary | ICD-10-CM | POA: Diagnosis not present

## 2021-02-08 LAB — GLUCOSE, CAPILLARY: Glucose-Capillary: 87 mg/dL (ref 70–99)

## 2021-02-08 MED ORDER — FLUDEOXYGLUCOSE F - 18 (FDG) INJECTION
5.5000 | Freq: Once | INTRAVENOUS | Status: AC
  Administered 2021-02-08: 5.12 via INTRAVENOUS

## 2021-02-15 ENCOUNTER — Ambulatory Visit (INDEPENDENT_AMBULATORY_CARE_PROVIDER_SITE_OTHER): Payer: Medicare Other | Admitting: Pulmonary Disease

## 2021-02-15 ENCOUNTER — Other Ambulatory Visit: Payer: Self-pay

## 2021-02-15 ENCOUNTER — Encounter: Payer: Self-pay | Admitting: Pulmonary Disease

## 2021-02-15 VITALS — BP 126/60 | HR 83 | Temp 98.3°F | Ht 64.0 in | Wt 102.8 lb

## 2021-02-15 DIAGNOSIS — R911 Solitary pulmonary nodule: Secondary | ICD-10-CM | POA: Diagnosis not present

## 2021-02-15 DIAGNOSIS — E039 Hypothyroidism, unspecified: Secondary | ICD-10-CM

## 2021-02-15 DIAGNOSIS — J432 Centrilobular emphysema: Secondary | ICD-10-CM | POA: Diagnosis not present

## 2021-02-15 DIAGNOSIS — Z87891 Personal history of nicotine dependence: Secondary | ICD-10-CM | POA: Diagnosis not present

## 2021-02-15 DIAGNOSIS — J449 Chronic obstructive pulmonary disease, unspecified: Secondary | ICD-10-CM

## 2021-02-15 DIAGNOSIS — D509 Iron deficiency anemia, unspecified: Secondary | ICD-10-CM | POA: Diagnosis not present

## 2021-02-15 DIAGNOSIS — J441 Chronic obstructive pulmonary disease with (acute) exacerbation: Secondary | ICD-10-CM

## 2021-02-15 NOTE — Progress Notes (Signed)
Synopsis: Referred in February 2021 establish care with new pulmonary provider, former patient Dr. Lake Bells PCP: Shelda Pal*  Subjective:   PATIENT ID: Andrea Ward GENDER: female DOB: [birth year], MRN: [id number]  Chief Complaint  Patient presents with   Follow-up    Follow up    This is a 75 year old female past medical history of hypoparathyroidism, gastroesophageal reflux disease anxiety and emphysema.  Patient had spirometry March 2020: Office spirometry FEV1 FVC ratio 57, FEV1 0.8 L 42% predicted.  Patient is a former smoker quit in August 2020 approximately 37-pack-year history.  OV 05/14/2019: Day following pulmonary function test.  FEV1 0.97 L, 53, no significant bronchodilator response, RV/TLC 160%, DLCO 50%.  Today we reviewed her PFTs in the office.  She is currently breathing well at baseline.  She is using Anoro Ellipta as she has a needed prior authorization for her Anoro or switching to a separate medication we discussed the various medications available today.  Plan to switch to [employer].  She is, monitor symptoms and let us know if she wants to switch back to Anoro if Stiolto will be adequate.  I suspect she will do fine with this however she may have some difficulty with the device.  We will train her on this device prior to leaving the office today.  Patient denies hemoptysis weight loss.  OV 12/28/2020: recent hospitalization. Doing better. No longer using a wheel chair. Up with a walker. Started PT at home. Using trelegy for COPD.  She still feels weak but feels like she is getting better.  At the office today with her son who agrees that she is heading in the right direction.  Has plans for a low-dose lung cancer screening CT repeat this month or next.  Last scan was in October 2021 lung RADS 2.  OV 02/15/2021: Here today for follow-up after low-dose lung cancer screening CT.  She had this completed at the beginning of November.  This revealed a new 10 mm left  lower lobe pulmonary nodule.  This was new in comparison to her scan in October of last year.  She also underwent a nuclear medicine PET scan that was completed.  Nuclear medicine PET scan on 02/08/2021 revealed mild left lower lobe uptake within the spiculated left lower lobe lesion concerning for malignancy.  There was a small spiculated right lower lobe pulmonary nodule that has been stable since October 2021.  She does have significant emphysema within the lung.  Here today in the office to discuss neck steps regarding lung nodule.      Past Medical History:  Diagnosis Date   Aortic atherosclerosis (HCC)    Emphysema of lung ([place])    Generalized anxiety disorder 05/23/2016   GERD (gastroesophageal reflux disease) 05/23/2016   History of pregnancy induced hypertension    Hypoparathyroidism ([place])    Acquired post surg   Hypothyroid      Family History  Problem Relation Age of Onset   Hypertension Mother    Hypertension Father    Anuerysm Sister      Past Surgical History:  Procedure Laterality Date   THYROIDECTOMY  05/17/2016   TONSILLECTOMY      Social History   Socioeconomic History   Marital status: Single    Spouse name: Not on file   Number of children: Not on file   Years of education: Not on file   Highest education level: Not on file  Occupational History   Not on file  Tobacco Use  Smoking status: Former    Packs/day: 0.75    Years: 50.00    Pack years: 37.50    Types: Cigarettes    Start date: 03/19/1965    Quit date: 11/10/2018    Years since quitting: 2.2   Smokeless tobacco: Never   Tobacco comments:    stopped smoknig august/2020!  Vaping Use   Vaping Use: Never used  Substance and Sexual Activity   Alcohol use: Yes    Alcohol/week: 5.0 standard drinks    Types: 5 Glasses of wine per week   Drug use: Not Currently   Sexual activity: Not Currently  Other Topics Concern   Not on file  Social History Narrative   Not on file   Social  Determinants of Health   Financial Resource Strain: Medium Risk   Difficulty of Paying Living Expenses: Somewhat hard  Food Insecurity: Not on file  Transportation Needs: No Transportation Needs   Lack of Transportation (Medical): No   Lack of Transportation (Non-Medical): No  Physical Activity: Insufficiently Active   Days of Exercise per Week: 5 days   Minutes of Exercise per Session: 10 min  Stress: Not on file  Social Connections: Not on file  Intimate Partner Violence: Not on file     No Known Allergies   Outpatient Medications Prior to Visit  Medication Sig Dispense Refill   acetaminophen (TYLENOL) 325 MG tablet Take 2 tablets by mouth every 6 (six) hours as needed.     albuterol (PROVENTIL) (2.5 MG/3ML) 0.083% nebulizer solution Take 3 mLs (2.5 mg total) by nebulization every 6 (six) hours as needed for wheezing or shortness of breath. 150 mL 1   atorvastatin (LIPITOR) 40 MG tablet Take 1 tablet (40 mg total) by mouth daily. 90 tablet 3   busPIRone (BUSPAR) 15 MG tablet TAKE 1 TABLET BY MOUTH THREE TIMES DAILY 270 tablet 0   calcitRIOL (ROCALTROL) 0.5 MCG capsule Take 1 mcg by mouth daily.     Calcium Carbonate Antacid (TUMS PO) Take 1,000 mg by mouth 3 (three) times daily.     cholecalciferol (VITAMIN D3) 25 MCG (1000 UNIT) tablet Take 1,000 Units by mouth daily.     Fluticasone-Umeclidin-Vilant (TRELEGY ELLIPTA) 100-62.5-25 MCG/INH AEPB Inhale 1 puff into the lungs daily. 180 each 1   guaifenesin (HUMIBID E) 400 MG TABS tablet Take 400 mg by mouth every 6 (six) hours as needed.     levothyroxine (SYNTHROID) 75 MCG tablet Take 1 tablet (75 mcg total) by mouth daily before breakfast. 90 tablet 2   VENTOLIN HFA 108 (90 Base) MCG/ACT inhaler Inhale 1-2 puffs into the lungs every 6 (six) hours as needed for wheezing or shortness of breath. 18 g 5   iron polysaccharides (NIFEREX) 150 MG capsule Take 1 capsule by mouth in the morning and at bedtime.     No facility-administered  medications prior to visit.    Review of Systems  Constitutional:  Negative for chills, fever, malaise/fatigue and weight loss.  HENT:  Negative for hearing loss, sore throat and tinnitus.   Eyes:  Negative for blurred vision and double vision.  Respiratory:  Positive for shortness of breath. Negative for cough, hemoptysis, sputum production, wheezing and stridor.   Cardiovascular:  Negative for chest pain, palpitations, orthopnea, leg swelling and PND.  Gastrointestinal:  Negative for abdominal pain, constipation, diarrhea, heartburn, nausea and vomiting.  Genitourinary:  Negative for dysuria, hematuria and urgency.  Musculoskeletal:  Negative for joint pain and myalgias.  Skin:  Negative for  itching and rash.  Neurological:  Negative for dizziness, tingling, weakness and headaches.  Endo/Heme/Allergies:  Negative for environmental allergies. Does not bruise/bleed easily.  Psychiatric/Behavioral:  Negative for depression. The patient is not nervous/anxious and does not have insomnia.   All other systems reviewed and are negative.   Objective:  Physical Exam Vitals reviewed.  Constitutional:      General: She is not in acute distress.    Appearance: She is well-developed.  HENT:     Head: Normocephalic and atraumatic.     Mouth/Throat:     Pharynx: No oropharyngeal exudate.  Eyes:     Conjunctiva/sclera: Conjunctivae normal.     Pupils: Pupils are equal, round, and reactive to light.  Neck:     Vascular: No JVD.     Trachea: No tracheal deviation.     Comments: Loss of supraclavicular fat Cardiovascular:     Rate and Rhythm: Normal rate and regular rhythm.     Heart sounds: S1 normal and S2 normal.     Comments: Distant heart tones Pulmonary:     Effort: No tachypnea or accessory muscle usage.     Breath sounds: No stridor. Decreased breath sounds (throughout all lung fields) present. No wheezing, rhonchi or rales.  Abdominal:     General: Bowel sounds are normal. There is  no distension.     Palpations: Abdomen is soft.     Tenderness: There is no abdominal tenderness.  Musculoskeletal:        General: No deformity.  Skin:    General: Skin is warm and dry.     Capillary Refill: Capillary refill takes less than 2 seconds.     Findings: No rash.  Neurological:     Mental Status: She is alert and oriented to person, place, and time.  Psychiatric:        Behavior: Behavior normal.     Vitals:   02/15/21 1344  BP: 126/60  Pulse: 83  Temp: 98.3 F (36.8 C)  TempSrc: Oral  SpO2: 97%  Weight: 102 lb 12.8 oz (46.6 kg)  Height: 5\' 4"  (1.626 m)   97% on RA BMI Readings from Last 3 Encounters:  02/15/21 17.65 kg/m  12/28/20 18.06 kg/m  12/09/20 16.86 kg/m   Wt Readings from Last 3 Encounters:  02/15/21 102 lb 12.8 oz (46.6 kg)  12/28/20 105 lb 3.2 oz (47.7 kg)  12/09/20 98 lb 4 oz (44.6 kg)     CBC    Component Value Date/Time   WBC 7.2 02/26/2019 0805   RBC 3.00 (L) 02/26/2019 0805   HGB 10.8 (L) 02/26/2019 0805   HGB 9.7 (L) 02/10/2019 1341   HCT 34.7 (L) 02/26/2019 0805   PLT 318 02/26/2019 0805   PLT 396 02/10/2019 1341   MCV 115.7 (H) 02/26/2019 0805   MCH 36.0 (H) 02/26/2019 0805   MCHC 31.1 02/26/2019 0805   RDW 24.3 (H) 02/26/2019 0805   LYMPHSABS 0.5 (L) 02/26/2019 0805   MONOABS 0.3 02/26/2019 0805   EOSABS 0.2 02/26/2019 0805   BASOSABS 0.0 02/26/2019 0805    Chest Imaging: October 2020 lung cancer screening CT lung RADS 2  Pulmonary Functions Testing Results: PFT Results Latest Ref Rng & Units 05/14/2019  FVC-Pre L 1.84  FVC-Predicted Pre % 78  FVC-Post L 1.97  FVC-Predicted Post % 84  Pre FEV1/FVC % % 51  Post FEV1/FCV % % 49  FEV1-Pre L 0.93  FEV1-Predicted Pre % 51  FEV1-Post L 0.97  DLCO uncorrected ml/min/mmHg  9.79  DLCO UNC% % 50  DLCO corrected ml/min/mmHg 9.79  DLCO COR %Predicted % 50  DLVA Predicted % 66  TLC L 6.41  TLC % Predicted % 126  RV % Predicted % 204       Assessment & Plan:      ICD-10-CM   1. Lung nodule  R91.1 Ambulatory referral to Pulmonology    Procedural/ Surgical Case Request: ROBOTIC ASSISTED NAVIGATIONAL BRONCHOSCOPY    CT Super D Chest Wo Contrast    2. Former smoker  Z87.891     3. Lung nodule seen on imaging study  R91.1     4. Stage 3 severe COPD by GOLD classification ([place])  J44.9     5. Centrilobular emphysema (HCC)  J43.2       Assessment:   This is a 75 year old female, severe COPD, enrolled in our lung cancer screening program and on triple therapy inhaler regimen.  Recent lung cancer screening CT revealed a new 10 mm left lower lobe nodule concerning for malignancy.  Patient had a PET scan that was recently completed that showed low-level uptake.  We reviewed the CT images today and PET scan images with the patient and patient's son today in the office.  Plan: We talked about various methods to include more conservative short-term follow-up with CT imaging in 3 months versus consideration for tissue biopsy. Patient is anxious about this lesion would like to go ahead and consider tissue sampling. We will plan for bronchoscopy to be completed after the holidays. Bronchoscopy tentative date on 03/21/2021. Patient to return to see me or Eric Form in approximately 6 weeks which will be 1 week after her bronchoscopy.    Current Outpatient Medications:    acetaminophen (TYLENOL) 325 MG tablet, Take 2 tablets by mouth every 6 (six) hours as needed., Disp: , Rfl:    albuterol (PROVENTIL) (2.5 MG/3ML) 0.083% nebulizer solution, Take 3 mLs (2.5 mg total) by nebulization every 6 (six) hours as needed for wheezing or shortness of breath., Disp: 150 mL, Rfl: 1   atorvastatin (LIPITOR) 40 MG tablet, Take 1 tablet (40 mg total) by mouth daily., Disp: 90 tablet, Rfl: 3   busPIRone (BUSPAR) 15 MG tablet, TAKE 1 TABLET BY MOUTH THREE TIMES DAILY, Disp: 270 tablet, Rfl: 0   calcitRIOL (ROCALTROL) 0.5 MCG capsule, Take 1 mcg by mouth daily., Disp: , Rfl:     Calcium Carbonate Antacid (TUMS PO), Take 1,000 mg by mouth 3 (three) times daily., Disp: , Rfl:    cholecalciferol (VITAMIN D3) 25 MCG (1000 UNIT) tablet, Take 1,000 Units by mouth daily., Disp: , Rfl:    Fluticasone-Umeclidin-Vilant (TRELEGY ELLIPTA) 100-62.5-25 MCG/INH AEPB, Inhale 1 puff into the lungs daily., Disp: 180 each, Rfl: 1   guaifenesin (HUMIBID E) 400 MG TABS tablet, Take 400 mg by mouth every 6 (six) hours as needed., Disp: , Rfl:    levothyroxine (SYNTHROID) 75 MCG tablet, Take 1 tablet (75 mcg total) by mouth daily before breakfast., Disp: 90 tablet, Rfl: 2   VENTOLIN HFA 108 (90 Base) MCG/ACT inhaler, Inhale 1-2 puffs into the lungs every 6 (six) hours as needed for wheezing or shortness of breath., Disp: 18 g, Rfl: 5   iron polysaccharides (NIFEREX) 150 MG capsule, Take 1 capsule by mouth in the morning and at bedtime., Disp: , Rfl:   I spent 43 minutes dedicated to the care of this patient on the date of this encounter to include pre-visit review of records, face-to-face time with the  patient discussing conditions above, post visit ordering of testing, clinical documentation with the electronic health record, making appropriate referrals as documented, and communicating necessary findings to members of the patients care team.    Garner Nash, DO [facility] 02/15/2021 2:12 PM

## 2021-02-15 NOTE — Patient Instructions (Signed)
Thank you for visiting Dr. Valeta Harms at [facility]. Today we recommend the following:  Orders Placed This Encounter  Procedures   Procedural/ Surgical Case Request: ROBOTIC ASSISTED NAVIGATIONAL BRONCHOSCOPY   CT Super D Chest Wo Contrast   Ambulatory referral to Pulmonology   Plans for bronchoscopy on 03/21/2021  Return in about 6 weeks (around 03/27/2021) for with Eric Form, NP, or Dr. Valeta Harms.    Please do your part to reduce the spread of COVID-19.

## 2021-02-16 ENCOUNTER — Telehealth: Payer: Self-pay | Admitting: Pulmonary Disease

## 2021-02-16 DIAGNOSIS — R911 Solitary pulmonary nodule: Secondary | ICD-10-CM | POA: Insufficient documentation

## 2021-02-16 NOTE — Progress Notes (Signed)
Pt. Was seen in the clinic 11/30 by Dr. Valeta Harms. He explained that her  that Nuclear medicine PET scan on 02/08/2021 revealed mild left lower lobe uptake within the spiculated left lower lobe lesion concerning for malignancy.  There was a small spiculated right lower lobe pulmonary nodule that has been stable since October 2021.   Both options for conservative short term follow up CT in 3 months vs consideration for tissue biopsy/ bronch. As patient is anxious she has opted for tissue sampling.She is scheduled for bronch 03/21/2021.

## 2021-02-16 NOTE — Telephone Encounter (Signed)
I have scheduled pt for 1/3 at 9:15.  CT will be done at [facility] on 12/29 at 12:30.  I gave appt info to pt.  She is going to get covid test on the morning of her procedure due to son coming from [place] to take her to appts and will be easier for them.  I sent a staff message to Vista Lawman to make her aware.

## 2021-02-22 ENCOUNTER — Ambulatory Visit: Payer: Medicare Other | Admitting: Pulmonary Disease

## 2021-03-15 ENCOUNTER — Ambulatory Visit: Payer: Medicare Other | Admitting: Family Medicine

## 2021-03-16 ENCOUNTER — Ambulatory Visit (HOSPITAL_COMMUNITY): Payer: Medicare Other | Admitting: Vascular Surgery

## 2021-03-16 ENCOUNTER — Encounter (HOSPITAL_COMMUNITY): Payer: Self-pay | Admitting: Pulmonary Disease

## 2021-03-16 ENCOUNTER — Other Ambulatory Visit: Payer: Self-pay

## 2021-03-16 ENCOUNTER — Ambulatory Visit (HOSPITAL_COMMUNITY)
Admission: RE | Admit: 2021-03-16 | Discharge: 2021-03-16 | Disposition: A | Discharge status: Home / Self Care | Payer: Medicare Other | Source: Ambulatory Visit | Attending: Pulmonary Disease | Admitting: Pulmonary Disease

## 2021-03-16 DIAGNOSIS — I700 Atherosclerosis of aorta: Secondary | ICD-10-CM | POA: Diagnosis not present

## 2021-03-16 DIAGNOSIS — R911 Solitary pulmonary nodule: Secondary | ICD-10-CM | POA: Insufficient documentation

## 2021-03-16 DIAGNOSIS — J439 Emphysema, unspecified: Secondary | ICD-10-CM | POA: Diagnosis not present

## 2021-03-16 NOTE — Progress Notes (Signed)
Spoke with pt for pre-op call. Pt denies cardiac history, HTN or Diabetes. Pt does have hypothyroidism. Pt is taking her Synthroid as directed. She does complain of pain in her ribs and back for a few months, but states it's gotten much worse in the last few days. She denies any heart pain, diaphoresis or shortness of breath. I instructed her to call her PCP, Dr. Nani Ravens today and let him know about this pain she's having. I also instructed her if she starts having shortness of breath, breaks out in a sweat or pain in her arms to call 911. She voiced understanding.   Covid test on arrival day of surgery.

## 2021-03-16 NOTE — Anesthesia Preprocedure Evaluation (Deleted)
Anesthesia Evaluation    Reviewed: Allergy & Precautions, Patient's Chart, lab work & pertinent test results  Airway        Dental   Pulmonary COPD,  COPD inhaler, Current Smoker and Patient abstained from smoking., former smoker,           Cardiovascular + CAD       Neuro/Psych PSYCHIATRIC DISORDERS Anxiety    GI/Hepatic Neg liver ROS, GERD  ,  Endo/Other  Hypothyroidism   Renal/GU negative Renal ROS     Musculoskeletal negative musculoskeletal ROS (+)   Abdominal   Peds  Hematology   Anesthesia Other Findings   Reproductive/Obstetrics                            Anesthesia Physical Anesthesia Plan  ASA: 3  Anesthesia Plan: General   Post-op Pain Management:    Induction: Intravenous  PONV Risk Score and Plan: 3 and Ondansetron  Airway Management Planned: Oral ETT  Additional Equipment: None  Intra-op Plan:   Post-operative Plan: Extubation in OR  Informed Consent:   Plan Discussed with: CRNA  Anesthesia Plan Comments: (PAT note written 03/16/2021 by Myra Gianotti, PA-C.  Cancelled by proceduralist.  )      Anesthesia Quick Evaluation

## 2021-03-16 NOTE — Progress Notes (Signed)
Anesthesia Chart Review:SAME DAY WORK-UP  Case: [id number] Date/Time: 03/21/21 0915   Procedure: ROBOTIC ASSISTED NAVIGATIONAL BRONCHOSCOPY (Left) - ION w/ CIOS   Anesthesia type: General   Diagnosis: Lung nodule [R91.1]   Pre-op diagnosis: Lung nodule   Location: [facility] RO[facility] 3 / [place] [facility]   Surgeons: Garner Nash, DO       DISCUSSION: Patient is a 75 year old female scheduled for the above procedure. Recent lung cancer screening chest CT showed a suspicious LLL lung nodule. 02/08/21 PET scan showed mild FDG uptake LLL nodule and stable small spiculate RLL nodule. The above procedure was recommended.   History includes former smoker, COPD/emphysema (stage 3 severe COPD by GOLD classification), total thyroidectomy & parathyroid autotransplant (07/11/16), post surgical hypothyroidism & hypoparathyroidism, GERD, anemia (transfusion 12/2018, 10/2020; 11/11/20 EGD: stomach/jejunum AVMs s/p APC ablation), aortic atherosclerosis, carotid artery disease, anxiety. 3 cm AAA by 02/08/21 PET scan (3 year follow-up recommended).  - [facility] admission 11/28/20-12/01/20 for syncope in setting of acute diarrhea, dehydration. + C. difficile colitis, treated with vancomycin. Head CT and brain MRI negative for acute findings. Echo showed EF 50-55%, no LV wall motion abnormalities, no AS. Orthostatics negative, although persistently low MAP and propanolol was held. Synthroid increased to 100 mcg for elevated TSH and normal Free T4.  (She had repeat TSH and Free T4 01/04/21 through her PCP which were abnormal at 178.18 and < 0.01. For unclear reasons, she had not been taking levothyroxine since her hospital discharged. Dr. Nani Ravens advised she resume levothyroxine--Per 01/10/21 pharmacist documentation, on 01/05/21 she restated Levothyroxine 75 mcg 1 tablet 6 days per week, none on Sundays.)  - [facility] admission 11/10/20-11/14/20 for acute blood loss anemia with HGB of 4.1. Stoop guaiac  positive. S/p 2 units PRBC and IV iron. EGD showed stomach and jejunal AVMs, s/p APC ablation. Out-patient capsule study planned.    She has a chest CT scheduled from 03/16/28 at [facility]. Her son from [place] will be bringing her for surgery, so COVID-19 testing is scheduled for day of surgery. Anesthesia team to evaluate on the day of surgery.   VS:  BP Readings from Last 3 Encounters:  02/15/21 126/60  12/28/20 (!) 102/52  12/09/20 112/60   Pulse Readings from Last 3 Encounters:  02/15/21 83  12/28/20 (!) 103  12/09/20 81      PROVIDERS: Shelda Pal, DO is PCP  June Leap, DO is pulmonologist Margaretmary Bayley, MD is GI ([facility]) Amalia Greenhouse, MD is endocrinologist ([facility]) Buford Dresser, MD is cardiologist. Visit on 07/17/19 for coronary calcifications on chest CT. Asymptomatic of coronary and carotid atherosclerosis. Continue rosuvastatin. One year follow-up advised.  Brabham, V. Rock Nephew, MD is vascular surgeon. Visit on 08/25/18 for carotid artery stenosis, asymptomatic. She was stated on statin and ASA recommended. Six month follow-up US recommended.     LABS: For day of surgery as indicated. Last results include: BMET 11/30/20: K 3.4, glucose 83, Cr 0.85, Ca 7.3, Mg 2.0, Ph 2.9; CBC 11/29/20: H/H 10.4/33.0, PLT 319; TSH 01/04/21: 178.18 (levothyroxine resumed)    OTHER: Small Bowel Capsule Endoscopy 01/11/21 ([facility]): Per Dr. Margaretmary Bayley, "SBCE shows a few scattered AVMs in the SB ,no active bleeding. 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  8 mm actively bleeding  AVM appreciated in the stomach which was ablated using APC, duodenum was normal, jejunum demonstrated 3 AVMs actively oozing blood which were all ablated.   Complete hemostasis achieved.  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 FEV1 0.93 (51%), post 0.97 (53%). DLCO unc/cor 9.79 (50%).   IMAGES: CT Super D Chest 03/16/21: In process.  PET Scan 02/08/21: IMPRESSION: - Mild FDG uptake, less than cardiac blood pool with respect to spiculated LEFT lower lobe pulmonary nodule. This remains concerning for indolent bronchogenic neoplasm. - Small spiculated RIGHT lower lobe pulmonary nodule stable since October of 2021. - Pulmonary emphysema marked and worse at the lung apices. - 3 cm infrarenal abdominal aortic aneurysm. Recommend follow-up every 3 years.  CT Chest 01/26/21: IMPRESSION: 1. Lung-RADS 4B, suspicious. Additional imaging evaluation or consultation with Pulmonology or Thoracic Surgery recommended. New left lower lobe pulmonary nodule of volume derived equivalent diameter 10.9 mm. 2. No thoracic adenopathy. 3. Aortic Atherosclerosis (ICD10-I70.0) and Emphysema (ICD10-J43.9). Coronary artery atherosclerosis.   MRI Brain 11/29/20 ([facility]): IMPRESSION:  No acute intracranial process.     EKG:  EKG 11/28/20 ([facility]): Per Result Narrative in Care Everywhere:  Sinus rhythm  Possible LVH  Otherwise normal  Confirmed by Mathis Bud [phone]) on 11/28/2020 12:05:15 PM    CV: Echo 11/29/20 ([facility]): SUMMARY  Left ventricular systolic function is low normal.  LV ejection fraction = 50-55%.  No segmental wall motion abnormalities seen in the left ventricle  Left ventricular filling pattern is prolonged relaxation.  The right ventricle is borderline dilated.  The right ventricular systolic function is borderline reduced.  There is mild mitral regurgitation.  There is mild tricuspid regurgitation.  IVC size was normal.  There is no pericardial effusion.  There is no  comparison study available.    Carotid US 08/25/18: Summary:  - Right Carotid: Velocities consistent with 1-39% stenosis in the proximal ICA and 40-59% stenosis in the distal ICA.  - Left Carotid: Velocities consistent with 1-39% stenosis in the proximal ICA and 40-59% stenosis in the mid ICA.  - Vertebrals: Bilateral vertebral arteries demonstrate antegrade flow. Elevated  velocities at the origin of the left vertebral.     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    acetaminophen (TYLENOL) 325 MG tablet   albuterol (PROVENTIL) (2.5 MG/3ML) 0.083% nebulizer solution   atorvastatin (LIPITOR) 40 MG tablet   busPIRone (BUSPAR) 15 MG tablet   calcitRIOL (ROCALTROL) 0.5 MCG capsule   calcium carbonate (TUMS - DOSED IN MG ELEMENTAL CALCIUM) 500 MG chewable tablet   cholecalciferol (VITAMIN D3) 25 MCG (1000 UNIT) tablet   Cyanocobalamin (VITAMIN B-12 PO)   Fluticasone-Umeclidin-Vilant (TRELEGY ELLIPTA) 100-62.5-25 MCG/INH AEPB   guaifenesin (HUMIBID E) 400 MG TABS tablet  guaiFENesin-dextromethorphan (ROBITUSSIN DM) 100-10 MG/5ML syrup   ibuprofen (ADVIL) 200 MG tablet   levothyroxine (SYNTHROID) 75 MCG tablet   propranolol (INDERAL) 20 MG tablet   VENTOLIN HFA 108 (90 Base) MCG/ACT inhaler    Myra Gianotti, PA-C Surgical Short Stay/Anesthesiology [facility] Phone [phone] [facility] Phone [phone] 03/16/2021 1:55 PM

## 2021-03-21 ENCOUNTER — Other Ambulatory Visit: Payer: Self-pay | Admitting: Family Medicine

## 2021-03-21 ENCOUNTER — Ambulatory Visit (HOSPITAL_COMMUNITY)
Admission: RE | Admit: 2021-03-21 | Discharge: 2021-03-21 | Disposition: A | Discharge status: Home / Self Care | Payer: Medicare Other | Attending: Pulmonary Disease | Admitting: Pulmonary Disease

## 2021-03-21 ENCOUNTER — Telehealth: Payer: Self-pay | Admitting: Pulmonary Disease

## 2021-03-21 ENCOUNTER — Telehealth: Payer: Medicare Other

## 2021-03-21 ENCOUNTER — Encounter (HOSPITAL_COMMUNITY): Payer: Self-pay | Admitting: Pulmonary Disease

## 2021-03-21 ENCOUNTER — Encounter (HOSPITAL_COMMUNITY)
Admission: RE | Disposition: A | Discharge status: Home / Self Care | Payer: Self-pay | Source: Home / Self Care | Attending: Pulmonary Disease

## 2021-03-21 ENCOUNTER — Other Ambulatory Visit: Payer: Self-pay

## 2021-03-21 ENCOUNTER — Ambulatory Visit (HOSPITAL_COMMUNITY): Payer: Medicare Other

## 2021-03-21 DIAGNOSIS — J439 Emphysema, unspecified: Secondary | ICD-10-CM

## 2021-03-21 DIAGNOSIS — Z20822 Contact with and (suspected) exposure to covid-19: Secondary | ICD-10-CM | POA: Diagnosis not present

## 2021-03-21 DIAGNOSIS — R911 Solitary pulmonary nodule: Secondary | ICD-10-CM | POA: Diagnosis not present

## 2021-03-21 DIAGNOSIS — Z419 Encounter for procedure for purposes other than remedying health state, unspecified: Secondary | ICD-10-CM

## 2021-03-21 DIAGNOSIS — Z538 Procedure and treatment not carried out for other reasons: Secondary | ICD-10-CM | POA: Diagnosis not present

## 2021-03-21 HISTORY — DX: Anemia, unspecified: D64.9

## 2021-03-21 HISTORY — DX: Disorder of arteries and arterioles, unspecified: I77.9

## 2021-03-21 LAB — BASIC METABOLIC PANEL
Anion gap: 10 (ref 5–15)
BUN: 6 mg/dL — ABNORMAL LOW (ref 8–23)
CO2: 27 mmol/L (ref 22–32)
Calcium: 8.9 mg/dL (ref 8.9–10.3)
Chloride: 99 mmol/L (ref 98–111)
Creatinine, Ser: 0.95 mg/dL (ref 0.44–1.00)
GFR, Estimated: >60 mL/min (ref >60)
Glucose, Bld: 89 mg/dL (ref 70–99)
Potassium: 3.7 mmol/L (ref 3.5–5.1)
Sodium: 136 mmol/L (ref 135–145)

## 2021-03-21 LAB — CBC
HCT: 34.7 % — ABNORMAL LOW (ref 36.0–46.0)
Hemoglobin: 10.4 g/dL — ABNORMAL LOW (ref 12.0–15.0)
MCH: 30.7 pg (ref 26.0–34.0)
MCHC: 30 g/dL (ref 30.0–36.0)
MCV: 102.4 fL — ABNORMAL HIGH (ref 80.0–100.0)
Platelets: 364 10*3/uL (ref 150–400)
RBC: 3.39 MIL/uL — ABNORMAL LOW (ref 3.87–5.11)
RDW: 15.3 % (ref 11.5–15.5)
WBC: 4.8 10*3/uL (ref 4.0–10.5)
nRBC: 0 % (ref 0.0–0.2)

## 2021-03-21 LAB — SARS CORONAVIRUS 2 BY RT PCR (HOSPITAL ORDER, PERFORMED IN ~~LOC~~ HOSPITAL LAB): SARS Coronavirus 2: NEGATIVE

## 2021-03-21 SURGERY — CANCELLED PROCEDURE
Anesthesia: General | Laterality: Left

## 2021-03-21 MED ORDER — LACTATED RINGERS IV SOLN: INTRAVENOUS | Status: DC

## 2021-03-21 MED ORDER — PREDNISONE 10 MG PO TABS: ORAL_TABLET | ORAL | 0 refills | Status: DC

## 2021-03-21 MED ORDER — ALBUTEROL SULFATE (2.5 MG/3ML) 0.083% IN NEBU
INHALATION_SOLUTION | RESPIRATORY_TRACT | Status: AC
  Filled 2021-03-21: qty 3

## 2021-03-21 MED ORDER — ALBUTEROL SULFATE (2.5 MG/3ML) 0.083% IN NEBU
2.5000 mg | INHALATION_SOLUTION | Freq: Once | RESPIRATORY_TRACT | Status: AC
  Administered 2021-03-21: 2.5 mg via RESPIRATORY_TRACT

## 2021-03-21 MED ORDER — CHLORHEXIDINE GLUCONATE 0.12 % MT SOLN
OROMUCOSAL | Status: AC
  Administered 2021-03-21: 15 mL via OROMUCOSAL
  Filled 2021-03-21: qty 15

## 2021-03-21 MED ORDER — CHLORHEXIDINE GLUCONATE 0.12 % MT SOLN: 15.0000 mL | Freq: Once | OROMUCOSAL | Status: AC

## 2021-03-21 NOTE — H&P (Signed)
PCCM  Patient's super D CT reviewed.  Left lower lobe spiculated nodule is decreasing in size from follow-up imaging.  I do not think she needs bronchoscopy today.  I reviewed imaging with patient today prior to procedure and preop.  We will cancel her case for today and plan for repeat noncontrasted CT scan imaging in 6 months.  Additionally she has some chest tightness and shortness of breath today.  Due to her severe COPD I will go ahead and treat her with a prednisone taper.  She is following up with primary care next week.  [facility] 03/21/2021 9:31 AM

## 2021-03-21 NOTE — Telephone Encounter (Signed)
PCCM:  Patient presented today to the hospital with a new super D CT imaging reveals a left lower lobe nodule that is resolving in comparison to her previous follow-up imaging.  I have made a decision to cancel her bronchoscopy.  I believe this lesion is inflammatory nature and we should not proceed with bronchoscopy at this time.  She still has a little bit of chest tightness and also complaining of some lower thoracic back pain.  I Andrea Ward put her on a short course of prednisone.  She has follow-up with primary care scheduled next week.  Please order repeat noncontrasted CT scan of the chest in 6 months.  Garner Nash, DO [facility] 03/21/2021 9:35 AM

## 2021-03-21 NOTE — Progress Notes (Signed)
Procedure cancelled per Dr. Valeta Harms.  Patient's IV removed with catheter in tact.  Patient taken to main entrance in wheelchair, discharged home with son.

## 2021-03-21 NOTE — Telephone Encounter (Signed)
CT has already been ordered by Dr. Valeta Harms for July 2023. Will forward to PCC's as FYI.

## 2021-03-24 ENCOUNTER — Ambulatory Visit (INDEPENDENT_AMBULATORY_CARE_PROVIDER_SITE_OTHER): Payer: Medicare Other | Admitting: Pharmacist

## 2021-03-24 DIAGNOSIS — E039 Hypothyroidism, unspecified: Secondary | ICD-10-CM

## 2021-03-24 DIAGNOSIS — J441 Chronic obstructive pulmonary disease with (acute) exacerbation: Secondary | ICD-10-CM

## 2021-03-24 DIAGNOSIS — D509 Iron deficiency anemia, unspecified: Secondary | ICD-10-CM

## 2021-03-24 DIAGNOSIS — I2510 Atherosclerotic heart disease of native coronary artery without angina pectoris: Secondary | ICD-10-CM

## 2021-03-24 NOTE — Chronic Care Management (AMB) (Signed)
Chronic Care Management Pharmacy Note  03/24/2021 Name:  Andrea Ward MRN:  [id number] DOB:  08-Sep-1945  Summary:  Assisted patient in reordering Trelegy from [facility] patient assistance program.  Reviewed medication list with patient. Updated list.  Will continue to follow patient and assess adherence.  Will see PCP 04/05/2021 - due to have thyroid function checked - consider also checking CBC, BMP, PTH, and vitamin D.   Subjective: Andrea Ward is an 76 y.o. year old female who is a primary patient of Shelda Pal, DO.  The CCM team was consulted for assistance with disease management and care coordination needs.    Engaged with patient by telephone for follow up visit Initial contact was started in response to provider referral for pharmacy case management and/or care coordination services.   Consent to Services:  The patient was given information about Chronic Care Management services, agreed to services, and gave verbal consent prior to initiation of services.  Please see initial visit note for detailed documentation.   Patient Care Team: Shelda Pal, DO as PCP - General (Family Medicine) Buford Dresser, MD as PCP - Cardiology (Cardiology) Juanito Doom, MD as Consulting Physician (Pulmonary Disease) [name], RPH-CPP (Pharmacist) Garner Nash, DO as Consulting Physician (Pulmonary Disease)  Recent office visits: 12/09/2020 - PCP (Dr Nani Ravens) Seen for weakness and balance issues. Refreal for home health PT. Recommended Fiber Supplement for diarrhea 09/23/2020 - PCP - phone call - patient to pick up #4 samples of Trelegy 09/21/2020 - PCP (Dr Nani Ravens) F/U COPD. Prescribed prednisone 71m daily until she can get Trelegy. Prescribed meloxicam 7.58mdaily for osteoarthritis.    Recent consult visits: 03/21/2021 - Procedure - was planning to do bronchoscopy but CT of lung showed that nodule has decreased in size. Bronchoscopy on hold for  now. plan for repeat noncontrasted CT scan imaging in 6 months. Also prescribed prednisone taper 02/15/2021 - Pulmonary (Dr IcValeta Harms Discuss lower lobe pulmonary nodule. Had PET scan 02/08/2021. Showed possible concern for malignancy. Plan: bronchoscopy with tentative date of 03/21/2021. 01/16/2021 - GI ([facility]. SBCE shows a few scattered AVMs in the SB ,no active bleeding. Needs Iron replacement per PCP, if cannot tolerate PO Iron then will need IV Iron 01/07/2021 - Pulmonary (Dr IcValeta HarmsF/U severe COPD. Continue Trelegy 10042mdaily - given sample. F/U in 6 months. 09/15/2020 - ophthalmology ([facility] - Dr. ForTrinna Postataract eval; no surgery for cataract removal at this time - monitoring;  09/13/2020 - Endo ([facility] - Dr PatPosey Pronto/U post-surgical hypoparathyroidism and hypothyroidism. Repeat TSH, T4, calcium and PTH; reflled calcitriol. Refill Synthroid.   [facility]  New medications at discharge: vancomysin 47m27m - take 2.5mL 82m25mg 2my 6 hours for 8 days Medications changed at discharge: Increased levothyroxine to 100mcg 94my 6 days per week/ Sundays off.  Medications stopped at discharge: potassium chloride 20 mEq and propranolol 20mg  072m/2022 to 11/14/2020 [facility] servere [facility]. Also has low magnesium, potassium and calcium.  Medications started at discharge: oral iron supplement 147mg twi22m day (Nu-Iron)  potassium supplement 20mEq onc52mday for 14 days acetaminophen 325mg - tak60mtablets = 647mg every 74murs as needed Medication changes at discharge: increased magnesium oxide to 400mg twice a68m    Objective:  Lab Results  Component Value Date   CREATININE 0.95  03/21/2021   CREATININE 0.73 06/17/2020   CREATININE 0.78 02/10/2019    No results found for: HGBA1C Last diabetic Eye exam: No results  found for: HMDIABEYEEXA  Last diabetic Foot exam: No results found for: HMDIABFOOTEX      Component Value Date/Time   CHOL 139 06/17/2020 1158   TRIG 82.0 06/17/2020 1158   HDL 45.90 06/17/2020 1158   CHOLHDL 3 06/17/2020 1158   VLDL 16.4 06/17/2020 1158   LDLCALC 77 06/17/2020 1158    Hepatic Function Latest Ref Rng & Units 06/17/2020 02/10/2019 02/02/2019  Total Protein 6.0 - 8.3 g/dL 7.2 7.7 6.9  Albumin 3.5 - 5.2 g/dL 3.5 3.9 3.6  AST 0 - 37 U/L 12 15 16   ALT 0 - 35 U/L 5 5 5   Alk Phosphatase 39 - 117 U/L 67 62 57  Total Bilirubin 0.2 - 1.2 mg/dL 0.4 0.5 0.4  Bilirubin, Direct 0.1 - 0.5 mg/dL - - -    Lab Results  Component Value Date/Time   TSH 178.18 (H) 01/04/2021 03:17 PM   TSH 0.18 (L) 12/23/2019 09:07 AM   FREET4 <0.01 (L) 01/04/2021 03:17 PM   FREET4 1.5 12/23/2019 09:07 AM    CBC Latest Ref Rng & Units 03/21/2021 02/26/2019 02/10/2019  WBC 4.0 - 10.5 K/uL 4.8 7.2 7.6  Hemoglobin 12.0 - 15.0 g/dL 10.4(L) 10.8(L) 9.7(L)  Hematocrit 36.0 - 46.0 % 34.7(L) 34.7(L) 31.8(L)  Platelets 150 - 400 K/uL 364 318 396    No results found for: VD25OH  Clinical ASCVD: Yes  The 10-year ASCVD risk score (Arnett DK, et al., 2019) is: 59.8%   Values used to calculate the score:     Age: 30 years     Sex: Female     Is Non-Hispanic African American: Yes     Diabetic: Yes     Tobacco smoker: Yes     Systolic Blood Pressure: 630 mmHg     Is BP treated: Yes     HDL Cholesterol: 45.9 mg/dL     Total Cholesterol: 139 mg/dL     Social History   Tobacco Use  Smoking Status Some Days   Years: 50.00   Types: Cigarettes   Start date: 03/19/1965   Last attempt to quit: 11/10/2018   Years since quitting: 2.3  Smokeless Tobacco Never  Tobacco Comments   stopped smoknig august/2020!   BP Readings from Last 3 Encounters:  03/21/21 (!) 186/66  02/15/21 126/60  12/28/20 (!) 102/52   Pulse Readings from Last 3 Encounters:  03/21/21 89  02/15/21 83  12/28/20 (!) 103   Wt  Readings from Last 3 Encounters:  03/21/21 102 lb (46.3 kg)  02/15/21 102 lb 12.8 oz (46.6 kg)  12/28/20 105 lb 3.2 oz (47.7 kg)    Assessment: Review of patient past medical history, allergies, medications, health status, including review of consultants reports, laboratory and other test data, was performed as part of comprehensive evaluation and provision of chronic care management services.   SDOH:  (Social Determinants of Health) assessments and interventions performed:     CCM Care Plan  No Known Allergies  Medications Reviewed Today     Reviewed by Reece Agar, CRNA (Certified Registered Nurse Anesthetist) on 03/21/21 at [facility] List Status: Complete   Medication Order Taking? Sig Documenting Provider Last Dose Status Informant  acetaminophen (TYLENOL) 325 MG tablet [id number] Yes Take 650 mg by mouth every 6 (six) hours as needed (pain). [provider] Past Week Active Self  albuterol (PROVENTIL) (2.5 MG/3ML) 0.083% nebulizer solution [id number] Yes Take 3 mLs (2.5 mg total) by nebulization every 6 (six) hours as needed for wheezing or shortness of breath. Shelda Pal, DO 03/20/2021 Active Self  atorvastatin (LIPITOR) 40 MG tablet [id number] Yes Take 1 tablet (40 mg total) by mouth daily. Shelda Pal, DO 03/20/2021 Active Self  busPIRone (BUSPAR) 15 MG tablet [id number] Yes TAKE 1 TABLET BY MOUTH THREE TIMES DAILY Shelda Pal, DO 03/21/2021 0400 Active Self  calcitRIOL (ROCALTROL) 0.5 MCG capsule [id number] Yes Take 1 mcg by mouth daily. [provider] 03/20/2021 Active Self  calcium carbonate (TUMS - DOSED IN MG ELEMENTAL CALCIUM) 500 MG chewable tablet [id number] Yes Chew 1 tablet by mouth in the morning, at noon, and at bedtime. [provider] 03/20/2021 Active Self  cholecalciferol (VITAMIN D3) 25 MCG (1000 UNIT) tablet [id number] Yes Take 1,000 Units by mouth daily. [provider] 03/20/2021 Active Self   Cyanocobalamin (VITAMIN B-12 PO) [id number] Yes Take 1 tablet by mouth daily. [provider] 03/20/2021 Active Self  Fluticasone-Umeclidin-Vilant (TRELEGY ELLIPTA) 100-62.5-25 MCG/INH AEPB [id number] Yes Inhale 1 puff into the lungs daily. Shelda Pal, DO 03/20/2021 Active Self  guaifenesin (HUMIBID E) 400 MG TABS tablet [id number] Yes Take 400 mg by mouth every 6 (six) hours as needed (cough/congestion). [provider] 03/20/2021 Active Self  guaiFENesin-dextromethorphan (ROBITUSSIN DM) 100-10 MG/5ML syrup [id number] Yes Take 5 mLs by mouth every 4 (four) hours as needed for cough. [provider] 03/20/2021 Active Self  ibuprofen (ADVIL) 200 MG tablet [id number] Yes Take 200-400 mg by mouth every 6 (six) hours as needed (pain). [provider] 03/20/2021 Active Self  levothyroxine (SYNTHROID) 75 MCG tablet [id number] Yes Take 1 tablet (75 mcg total) by mouth daily before breakfast. Shelda Pal, DO 03/20/2021 Active Self           Med Note Antony Contras, [place] Jan 10, 2021 11:51 AM)    propranolol (INDERAL) 20 MG tablet [id number] Yes Take 20 mg by mouth 3 (three) times daily. [provider] 03/21/2021 9767 Active Self  VENTOLIN HFA 108 (90 Base) MCG/ACT inhaler [id number] Yes Inhale 1-2 puffs into the lungs every 6 (six) hours as needed for wheezing or shortness of breath. Shelda Pal, DO 03/20/2021 Active Self            Patient Active Problem List   Diagnosis Date Noted   Lung nodule 02/16/2021   Physical deconditioning 12/09/2020   Balance problem 12/09/2020   Aortic atherosclerosis ([place])    Bilateral carotid artery stenosis 08/27/2019   Coronary artery calcification seen on CT scan 08/27/2019   Idiopathic hypoparathyroidism ([place]) 02/02/2019   Ischemic colitis ([place]) 02/02/2019   Medication management 01/05/2019   Healthcare maintenance 12/17/2018   Former smoker 04/14/2018   Pulmonary emphysema ([place]) 01/09/2018    Tremor 08/19/2017   Tachycardia 07/10/2017   Hypothyroid    Hypothyroidism 06/12/2016   Pharyngeal dysphagia 06/12/2016   Quality of voice, hoarse 06/12/2016   Goiter 05/23/2016   GERD (gastroesophageal reflux disease) 05/23/2016   Generalized anxiety disorder 05/23/2016    Immunization History  Administered Date(s) Administered   Moderna Sars-Covid-2 Vaccination 07/09/2019, 08/06/2019   Pneumococcal Conjugate-13 05/23/2016   Pneumococcal Polysaccharide-23 07/10/2017   Tdap 05/23/2016    Conditions to be addressed/monitored: CAD, HTN, HLD, COPD, and hypothyroidism; hypoparathyroidism, low calcium; anemia; AVMs; anxiety; emphysema / COPD, GAD; joint pain, GERD  There are no care plans that you recently modified  to display for this patient.    Medication Assistance:  Trelegy obtained through [facility] medication assistance program.  Enrollment ends 01/16/2022  Confirmed today patient was approved 01/11/2021 thru 01/16/2022. Requested delivery of Trelegy - patient should receive in 7 to 10 business days.  Patient's preferred pharmacy is:  [facility], [place] [address] Phone: [phone] Fax: [phone]    Follow Up:  Patient agrees to Care Plan and Follow-up.  Plan: Telephone follow up appointment with care management team member scheduled for:  2 months  [name], PharmD Clinical Pharmacist [facility]

## 2021-03-24 NOTE — Patient Instructions (Addendum)
[name],  It was a pleasure speaking with you today.  I have attached a summary of our visit today and information about your health goals.  If you have any questions or concerns, please feel free to contact me either at the phone number below or with a MyChart message.   Keep up the good work!  [name], PharmD Clinical Pharmacist [facility] [phone] (direct line)  [phone] (main office number)   [name], PharmD Clinical Pharmacist [facility]   Hyperlipidemia: Not at goal; LDL goal <70 - last LDL was 77  Current treatment: atorvastatin 40mg  daily  Interventions:   Educated on LDL goals Recommended patient continue to take atorvastatin 40mg  daily  Recommended she limit intake of saturated and trans fat  Chronic Obstructive Pulmonary Disease: improving since started Trelegy:  Current treatment: Trelegy Inhaler 151mcg - inhale one puff into lungs once a day  Albuterol inhaler or nebulized solution use as needed up to every 6 hour for shortness of breath or wheezing.  Interventions: Reviewed maintenance versus rescue inhalers Continue to follow up with [name] Reorder Trelegy today from [employer] patient assistance program  - you should receive in 7 to 10 days.  Osteopenia / hypocalcemia / hypoparathyroidism / hypothyroidism / fatigue:  Managed by endocrinologist - [name]  Current treatment:  Calcium + vitamin D  Interventions:  Continue to follow with [name]  Consider rechecking DEXA  Anemia (low hemoglobin or iron) due to blood loss:  Current Treatment:  none Interventions: Patient instructed to avoid meloxicam and all other NSAIDS (ibuprofen/ Advil or Motrin and naproxen / Aleve) . If having pain, headache or fever can use acetaminophen 325mg  - up to 2 tablets every 6 hours.    Patient Goals/Self-Care Activities Over the next 90 days, patient will:  take  medications as prescribed,  Focus on medication adherence by using pill container if needed Collaborate with provider on medication access solutions  Follow Up Plan: Telephone follow up appointment with care management team member scheduled for:  March 2023

## 2021-03-30 ENCOUNTER — Ambulatory Visit (INDEPENDENT_AMBULATORY_CARE_PROVIDER_SITE_OTHER): Payer: Medicare Other | Admitting: Pulmonary Disease

## 2021-03-30 ENCOUNTER — Other Ambulatory Visit: Payer: Self-pay

## 2021-03-30 ENCOUNTER — Encounter: Payer: Self-pay | Admitting: Pulmonary Disease

## 2021-03-30 VITALS — BP 142/68 | HR 102 | Temp 97.9°F | Ht 64.0 in | Wt 103.2 lb

## 2021-03-30 DIAGNOSIS — R911 Solitary pulmonary nodule: Secondary | ICD-10-CM

## 2021-03-30 DIAGNOSIS — J432 Centrilobular emphysema: Secondary | ICD-10-CM

## 2021-03-30 DIAGNOSIS — J449 Chronic obstructive pulmonary disease, unspecified: Secondary | ICD-10-CM | POA: Diagnosis not present

## 2021-03-30 DIAGNOSIS — Z87891 Personal history of nicotine dependence: Secondary | ICD-10-CM

## 2021-03-30 NOTE — Patient Instructions (Signed)
Thank you for visiting Dr. Valeta Harms at [facility]. Today we recommend the following:  Orders Placed This Encounter  Procedures   CT Super D Chest Wo Contrast   CT Chest in 6 months   Return in about 6 months (around 09/27/2021) for with Eric Form, NP, or Dr. Valeta Harms.    Please do your part to reduce the spread of COVID-19.

## 2021-03-30 NOTE — Progress Notes (Signed)
Synopsis: Referred in February 2021 establish care with new pulmonary provider, former patient Dr. Lake Bells PCP: Shelda Pal*  Subjective:   PATIENT ID: Andrea Ward GENDER: female DOB: 11-15-1945, MRN: [id number]  Chief Complaint  Patient presents with   Follow-up    Patient is here for follow up. Patient says she is in pain under her ribs.     This is a 76 year old female past medical history of hypoparathyroidism, gastroesophageal reflux disease anxiety and emphysema.  Patient had spirometry March 2020: Office spirometry FEV1 FVC ratio 57, FEV1 0.8 L 42% predicted.  Patient is a former smoker quit in August 2020 approximately 37-pack-year history.  OV 05/14/2019: Day following pulmonary function test.  FEV1 0.97 L, 53, no significant bronchodilator response, RV/TLC 160%, DLCO 50%.  Today we reviewed her PFTs in the office.  She is currently breathing well at baseline.  She is using Anoro Ellipta as she has a needed prior authorization for her Anoro or switching to a separate medication we discussed the various medications available today.  Plan to switch to [employer].  She is, monitor symptoms and let us know if she wants to switch back to Anoro if Stiolto will be adequate.  I suspect she will do fine with this however she may have some difficulty with the device.  We will train her on this device prior to leaving the office today.  Patient denies hemoptysis weight loss.  OV 12/28/2020: recent hospitalization. Doing better. No longer using a wheel chair. Up with a walker. Started PT at home. Using trelegy for COPD.  She still feels weak but feels like she is getting better.  At the office today with her son who agrees that she is heading in the right direction.  Has plans for a low-dose lung cancer screening CT repeat this month or next.  Last scan was in October 2021 lung RADS 2.  OV 02/15/2021: Here today for follow-up after low-dose lung cancer screening CT.  She had this  completed at the beginning of November.  This revealed a new 10 mm left lower lobe pulmonary nodule.  This was new in comparison to her scan in October of last year.  She also underwent a nuclear medicine PET scan that was completed.  Nuclear medicine PET scan on 02/08/2021 revealed mild left lower lobe uptake within the spiculated left lower lobe lesion concerning for malignancy.  There was a small spiculated right lower lobe pulmonary nodule that has been stable since October 2021.  She does have significant emphysema within the lung.  Here today in the office to discuss neck steps regarding lung nodule.  OV 03/30/2021: Follow-up after recent bronchoscopy.  Lung nodule on super D CT imaging had dissipated cyst suggestive of being an inflammatory nodule versus malignancy.  Decision was made to cancel her bronchoscopy.  Here today for follow-up to discuss next steps.      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   Aortic atherosclerosis (HCC)    Carotid artery disease (HCC)    Emphysema of lung (HCC)    Generalized anxiety disorder 05/23/2016   GERD (gastroesophageal reflux disease) 05/23/2016   History of pregnancy induced hypertension    Hypoparathyroidism ([place])    Acquired post surg   Hypothyroid      Family History  Problem Relation Age of Onset   Hypertension Mother    Hypertension Father  Anuerysm Sister      Past Surgical History:  Procedure Laterality Date   THYROIDECTOMY  07/11/2016   thyroidectomy & parathyroid autotransplantation   TONSILLECTOMY      Social History   Socioeconomic History   Marital status: Single    Spouse name: Not on file   Number of children: Not on file   Years of education: Not on file   Highest education level: Not on file  Occupational History   Not on file  Tobacco Use   Smoking status: Some Days    Years: 50.00    Types: Cigarettes    Start  date: 03/19/1965    Last attempt to quit: 11/10/2018    Years since quitting: 2.3   Smokeless tobacco: Never   Tobacco comments:    stopped smoknig august/2020!  Vaping Use   Vaping Use: Never used  Substance and Sexual Activity   Alcohol use: Yes    Alcohol/week: 5.0 standard drinks    Types: 5 Glasses of wine per week    Comment: occasional glass of wine   Drug use: Never   Sexual activity: Not Currently  Other Topics Concern   Not on file  Social History Narrative   Not on file   Social Determinants of Health   Financial Resource Strain: Medium Risk   Difficulty of Paying Living Expenses: Somewhat hard  Food Insecurity: Not on file  Transportation Needs: No Transportation Needs   Lack of Transportation (Medical): No   Lack of Transportation (Non-Medical): No  Physical Activity: Sufficiently Active   Days of Exercise per Week: 5 days   Minutes of Exercise per Session: 30 min  Stress: Not on file  Social Connections: Not on file  Intimate Partner Violence: Not on file     No Known Allergies   Outpatient Medications Prior to Visit  Medication Sig Dispense Refill   acetaminophen (TYLENOL) 325 MG tablet Take 650 mg by mouth every 6 (six) hours as needed (pain).     atorvastatin (LIPITOR) 40 MG tablet Take 1 tablet (40 mg total) by mouth daily. 90 tablet 3   busPIRone (BUSPAR) 15 MG tablet TAKE 1 TABLET BY MOUTH THREE TIMES DAILY 270 tablet 0   calcitRIOL (ROCALTROL) 0.5 MCG capsule Take 1 mcg by mouth daily.     calcium carbonate (TUMS - DOSED IN MG ELEMENTAL CALCIUM) 500 MG chewable tablet Chew 1 tablet by mouth in the morning, at noon, and at bedtime.     cholecalciferol (VITAMIN D3) 25 MCG (1000 UNIT) tablet Take 1,000 Units by mouth daily.     Cyanocobalamin (VITAMIN B-12 PO) Take 1 tablet by mouth daily.     diclofenac Sodium (VOLTAREN) 1 % GEL Apply 2 g topically 4 (four) times daily as needed (for joint pain).     Fluticasone-Umeclidin-Vilant (TRELEGY ELLIPTA)  100-62.5-25 MCG/INH AEPB Inhale 1 puff into the lungs daily. 180 each 1   guaifenesin (HUMIBID E) 400 MG TABS tablet Take 400 mg by mouth every 6 (six) hours as needed (cough/congestion).     guaiFENesin-dextromethorphan (ROBITUSSIN DM) 100-10 MG/5ML syrup Take 5 mLs by mouth every 4 (four) hours as needed for cough.     ibuprofen (ADVIL) 200 MG tablet Take 200-400 mg by mouth every 6 (six) hours as needed (pain).     predniSONE (DELTASONE) 10 MG tablet Take 4 tabs by mouth once daily x4 days, then 3 tabs x4 days, 2 tabs x4 days, 1 tab x4 days and stop. 40 tablet 0  propranolol (INDERAL) 20 MG tablet Take 20 mg by mouth 3 (three) times daily.     VENTOLIN HFA 108 (90 Base) MCG/ACT inhaler INHALE 1 TO 2 PUFFS BY MOUTH EVERY 6 HOURS AS NEEDED FOR WHEEZING AND FOR SHORTNESS OF BREATH 18 g 5   albuterol (PROVENTIL) (2.5 MG/3ML) 0.083% nebulizer solution Take 3 mLs (2.5 mg total) by nebulization every 6 (six) hours as needed for wheezing or shortness of breath. (Patient not taking: Reported on 03/24/2021) 150 mL 1   levothyroxine (SYNTHROID) 75 MCG tablet Take 1 tablet (75 mcg total) by mouth daily before breakfast. 90 tablet 2   No facility-administered medications prior to visit.    Review of Systems  Constitutional:  Negative for chills, fever, malaise/fatigue and weight loss.  HENT:  Negative for hearing loss, sore throat and tinnitus.   Eyes:  Negative for blurred vision and double vision.  Respiratory:  Negative for cough, hemoptysis, sputum production, shortness of breath, wheezing and stridor.   Cardiovascular:  Negative for chest pain, palpitations, orthopnea, leg swelling and PND.  Gastrointestinal:  Negative for abdominal pain, constipation, diarrhea, heartburn, nausea and vomiting.  Genitourinary:  Negative for dysuria, hematuria and urgency.  Musculoskeletal:  Negative for joint pain and myalgias.  Skin:  Negative for itching and rash.  Neurological:  Negative for dizziness, tingling,  weakness and headaches.  Endo/Heme/Allergies:  Negative for environmental allergies. Does not bruise/bleed easily.  Psychiatric/Behavioral:  Negative for depression. The patient is not nervous/anxious and does not have insomnia.   All other systems reviewed and are negative.   Objective:  Physical Exam Vitals reviewed.  Constitutional:      General: She is not in acute distress.    Appearance: She is well-developed.  HENT:     Head: Normocephalic and atraumatic.  Eyes:     General: No scleral icterus.    Conjunctiva/sclera: Conjunctivae normal.     Pupils: Pupils are equal, round, and reactive to light.  Neck:     Vascular: No JVD.     Trachea: No tracheal deviation.  Cardiovascular:     Rate and Rhythm: Normal rate and regular rhythm.     Heart sounds: Normal heart sounds. No murmur heard. Pulmonary:     Effort: Pulmonary effort is normal. No tachypnea, accessory muscle usage or respiratory distress.     Breath sounds: No stridor. No wheezing, rhonchi or rales.  Abdominal:     General: There is no distension.     Palpations: Abdomen is soft.     Tenderness: There is no abdominal tenderness.  Musculoskeletal:        General: No tenderness.     Cervical back: Neck supple.  Lymphadenopathy:     Cervical: No cervical adenopathy.  Skin:    General: Skin is warm and dry.     Capillary Refill: Capillary refill takes less than 2 seconds.     Findings: No rash.  Neurological:     Mental Status: She is alert and oriented to person, place, and time.  Psychiatric:        Behavior: Behavior normal.     Vitals:   03/30/21 1552  BP: (!) 142/68  Pulse: (!) 102  Temp: 97.9 F (36.6 C)  TempSrc: Oral  SpO2: 95%  Weight: 103 lb 3.2 oz (46.8 kg)  Height: 5\' 4"  (1.626 m)   95% on RA BMI Readings from Last 3 Encounters:  03/30/21 17.71 kg/m  03/21/21 17.51 kg/m  02/15/21 17.65 kg/m   Wt Readings  from Last 3 Encounters:  03/30/21 103 lb 3.2 oz (46.8 kg)  03/21/21 102 lb  (46.3 kg)  02/15/21 102 lb 12.8 oz (46.6 kg)     CBC    Component Value Date/Time   WBC 4.8 03/21/2021 0709   RBC 3.39 (L) 03/21/2021 0709   HGB 10.4 (L) 03/21/2021 0709   HGB 9.7 (L) 02/10/2019 1341   HCT 34.7 (L) 03/21/2021 0709   PLT 364 03/21/2021 0709   PLT 396 02/10/2019 1341   MCV 102.4 (H) 03/21/2021 0709   MCH 30.7 03/21/2021 0709   MCHC 30.0 03/21/2021 0709   RDW 15.3 03/21/2021 0709   LYMPHSABS 0.5 (L) 02/26/2019 0805   MONOABS 0.3 02/26/2019 0805   EOSABS 0.2 02/26/2019 0805   BASOSABS 0.0 02/26/2019 0805    Chest Imaging: October 2020 lung cancer screening CT lung RADS 2  Pulmonary Functions Testing Results: PFT Results Latest Ref Rng & Units 05/14/2019  FVC-Pre L 1.84  FVC-Predicted Pre % 78  FVC-Post L 1.97  FVC-Predicted Post % 84  Pre FEV1/FVC % % 51  Post FEV1/FCV % % 49  FEV1-Pre L 0.93  FEV1-Predicted Pre % 51  FEV1-Post L 0.97  DLCO uncorrected ml/min/mmHg 9.79  DLCO UNC% % 50  DLCO corrected ml/min/mmHg 9.79  DLCO COR %Predicted % 50  DLVA Predicted % 66  TLC L 6.41  TLC % Predicted % 126  RV % Predicted % 204       Assessment & Plan:     ICD-10-CM   1. Lung nodule  R91.1 CT Super D Chest Wo Contrast    2. Former smoker  Z87.891     3. Stage 3 severe COPD by GOLD classification ([place])  J44.9     4. Centrilobular emphysema (HCC)  J43.2       Assessment:   This is a 76 year old female, severe COPD enrolled in lung cancer screening program on triple therapy inhaler regimen.  Initially found to have a new lung cancer screening nodule at 10 mm left lower lobe concerning for malignancy PET scan showed low-level uptake and repeat super D scan prior to bronchoscopy showed partial resolution of the nodule and the bronchoscopy was canceled.  Plan: Patient needs repeat noncontrasted CT imaging in 6 months. Continue Trelegy for COPD management. Continue albuterol as needed for shortness of breath and wheezing. Follow-up in clinic with me  or Eric Form in 6 months.  After CT chest.    Current Outpatient Medications:    acetaminophen (TYLENOL) 325 MG tablet, Take 650 mg by mouth every 6 (six) hours as needed (pain)., Disp: , Rfl:    atorvastatin (LIPITOR) 40 MG tablet, Take 1 tablet (40 mg total) by mouth daily., Disp: 90 tablet, Rfl: 3   busPIRone (BUSPAR) 15 MG tablet, TAKE 1 TABLET BY MOUTH THREE TIMES DAILY, Disp: 270 tablet, Rfl: 0   calcitRIOL (ROCALTROL) 0.5 MCG capsule, Take 1 mcg by mouth daily., Disp: , Rfl:    calcium carbonate (TUMS - DOSED IN MG ELEMENTAL CALCIUM) 500 MG chewable tablet, Chew 1 tablet by mouth in the morning, at noon, and at bedtime., Disp: , Rfl:    cholecalciferol (VITAMIN D3) 25 MCG (1000 UNIT) tablet, Take 1,000 Units by mouth daily., Disp: , Rfl:    Cyanocobalamin (VITAMIN B-12 PO), Take 1 tablet by mouth daily., Disp: , Rfl:    diclofenac Sodium (VOLTAREN) 1 % GEL, Apply 2 g topically 4 (four) times daily as needed (for joint pain)., Disp: , Rfl:  Fluticasone-Umeclidin-Vilant (TRELEGY ELLIPTA) 100-62.5-25 MCG/INH AEPB, Inhale 1 puff into the lungs daily., Disp: 180 each, Rfl: 1   guaifenesin (HUMIBID E) 400 MG TABS tablet, Take 400 mg by mouth every 6 (six) hours as needed (cough/congestion)., Disp: , Rfl:    guaiFENesin-dextromethorphan (ROBITUSSIN DM) 100-10 MG/5ML syrup, Take 5 mLs by mouth every 4 (four) hours as needed for cough., Disp: , Rfl:    ibuprofen (ADVIL) 200 MG tablet, Take 200-400 mg by mouth every 6 (six) hours as needed (pain)., Disp: , Rfl:    predniSONE (DELTASONE) 10 MG tablet, Take 4 tabs by mouth once daily x4 days, then 3 tabs x4 days, 2 tabs x4 days, 1 tab x4 days and stop., Disp: 40 tablet, Rfl: 0   propranolol (INDERAL) 20 MG tablet, Take 20 mg by mouth 3 (three) times daily., Disp: , Rfl:    VENTOLIN HFA 108 (90 Base) MCG/ACT inhaler, INHALE 1 TO 2 PUFFS BY MOUTH EVERY 6 HOURS AS NEEDED FOR WHEEZING AND FOR SHORTNESS OF BREATH, Disp: 18 g, Rfl: 5   albuterol  (PROVENTIL) (2.5 MG/3ML) 0.083% nebulizer solution, Take 3 mLs (2.5 mg total) by nebulization every 6 (six) hours as needed for wheezing or shortness of breath. (Patient not taking: Reported on 03/24/2021), Disp: 150 mL, Rfl: 1   levothyroxine (SYNTHROID) 75 MCG tablet, Take 1 tablet (75 mcg total) by mouth daily before breakfast., Disp: 90 tablet, Rfl: 2   Garner Nash, DO [facility] 03/31/2021 1:27 PM

## 2021-04-05 ENCOUNTER — Encounter: Payer: Self-pay | Admitting: Family Medicine

## 2021-04-05 ENCOUNTER — Ambulatory Visit (INDEPENDENT_AMBULATORY_CARE_PROVIDER_SITE_OTHER): Payer: Medicare Other | Admitting: Family Medicine

## 2021-04-05 VITALS — BP 120/72 | HR 93 | Temp 97.9°F | Ht 64.0 in | Wt 103.4 lb

## 2021-04-05 DIAGNOSIS — I700 Atherosclerosis of aorta: Secondary | ICD-10-CM | POA: Diagnosis not present

## 2021-04-05 DIAGNOSIS — M1991 Primary osteoarthritis, unspecified site: Secondary | ICD-10-CM

## 2021-04-05 DIAGNOSIS — F411 Generalized anxiety disorder: Secondary | ICD-10-CM | POA: Diagnosis not present

## 2021-04-05 DIAGNOSIS — E200 Idiopathic hypoparathyroidism: Secondary | ICD-10-CM | POA: Diagnosis not present

## 2021-04-05 DIAGNOSIS — E890 Postprocedural hypothyroidism: Secondary | ICD-10-CM

## 2021-04-05 LAB — LIPID PANEL
Cholesterol: 130 mg/dL (ref 0–200)
HDL: 60.5 mg/dL (ref >39.00)
LDL Cholesterol: 51 mg/dL (ref 0–99)
NonHDL: 69.05
Total CHOL/HDL Ratio: 2
Triglycerides: 90 mg/dL (ref 0.0–149.0)
VLDL: 18 mg/dL (ref 0.0–40.0)

## 2021-04-05 LAB — COMPREHENSIVE METABOLIC PANEL
ALT: 10 U/L (ref 0–35)
AST: 14 U/L (ref 0–37)
Albumin: 3.7 g/dL (ref 3.5–5.2)
Alkaline Phosphatase: 56 U/L (ref 39–117)
BUN: 13 mg/dL (ref 6–23)
CO2: 37 mEq/L — ABNORMAL HIGH (ref 19–32)
Calcium: 9.9 mg/dL (ref 8.4–10.5)
Chloride: 99 mEq/L (ref 96–112)
Creatinine, Ser: 1.03 mg/dL (ref 0.40–1.20)
GFR: 53.81 mL/min — ABNORMAL LOW (ref >60.00)
Glucose, Bld: 97 mg/dL (ref 70–99)
Potassium: 3.7 mEq/L (ref 3.5–5.1)
Sodium: 144 mEq/L (ref 135–145)
Total Bilirubin: 0.4 mg/dL (ref 0.2–1.2)
Total Protein: 6.6 g/dL (ref 6.0–8.3)

## 2021-04-05 LAB — TSH: TSH: 5.76 u[IU]/mL — ABNORMAL HIGH (ref 0.35–5.50)

## 2021-04-05 LAB — T4, FREE: Free T4: 1.15 ng/dL (ref 0.60–1.60)

## 2021-04-05 MED ORDER — BUSPIRONE HCL 15 MG PO TABS: 15.0000 mg | ORAL_TABLET | Freq: Three times a day (TID) | ORAL | 2 refills | Status: AC

## 2021-04-05 MED ORDER — LEVOTHYROXINE SODIUM 75 MCG PO TABS: 75.0000 ug | ORAL_TABLET | Freq: Every day | ORAL | 2 refills | Status: AC

## 2021-04-05 MED ORDER — PROPRANOLOL HCL 20 MG PO TABS: 20.0000 mg | ORAL_TABLET | Freq: Three times a day (TID) | ORAL | 2 refills | Status: DC

## 2021-04-05 MED ORDER — TRAMADOL HCL 50 MG PO TABS: 50.0000 mg | ORAL_TABLET | Freq: Three times a day (TID) | ORAL | 0 refills | Status: DC | PRN

## 2021-04-05 NOTE — Progress Notes (Signed)
Chief Complaint  Patient presents with   Follow-up    6 month Rib cage pain Joint pain    Subjective Andrea Ward presents for f/u anxiety/depression.  Pt is currently being treated with BuSpar 15 mg TID.  Reports doing well since treatment. No thoughts of harming self or others. No self-medication with alcohol, prescription drugs or illicit drugs. Pt is not following with a counselor/psychologist.  Hypothyroidism Patient presents for follow-up of hypothyroidism.  Reports compliance with medication- levothyroxine 75 mcg/d. Current symptoms include: denies fatigue, weight changes, heat/cold intolerance, bowel/skin changes or CVS symptoms. She believes her dose should be unchanged  Hyperlipidemia Patient presents for dyslipidemia follow up. Currently being treated with Lipitor 40 mg/d and compliance with treatment thus far has been good. She denies myalgias. She is adhering to a healthy diet. Exercise: doing a recumbent pedal/stepper (not a recumbent bike.  The patient is not known to have coexisting coronary artery disease.  Has been having side pain and pain of the joints. She uses Voltaren gel and Tylenol w little relief. Wondering what else she can do.   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 If you have any questions, ask your nurse or doctor.          STOP taking these medications    ibuprofen 200 MG tablet Commonly known as: ADVIL Stopped by: Shelda Pal, DO       TAKE these medications    acetaminophen 325 MG tablet Commonly known as: TYLENOL Take 650 mg by mouth every 6 (six) hours as needed (pain).   albuterol (2.5 MG/3ML) 0.083% nebulizer solution Commonly known as: PROVENTIL Take 3 mLs (2.5 mg total) by nebulization every 6 (six) hours as needed for wheezing or shortness of breath.   Ventolin HFA 108 (90 Base) MCG/ACT inhaler Generic drug: albuterol INHALE 1 TO 2 PUFFS BY MOUTH EVERY 6 HOURS AS NEEDED FOR WHEEZING AND FOR SHORTNESS OF BREATH   atorvastatin 40 MG tablet Commonly known as: LIPITOR Take 1 tablet (40 mg total) by mouth daily.   busPIRone 15 MG tablet Commonly known as: BUSPAR Take 1 tablet (15 mg total) by mouth 3 (three) times daily.   calcitRIOL 0.5 MCG capsule Commonly known as: ROCALTROL Take 1 mcg by mouth daily.   calcium carbonate 500 MG chewable tablet Commonly known as: TUMS - dosed in mg elemental calcium Chew 1 tablet by mouth in the morning, at noon, and at bedtime.   cholecalciferol 25 MCG (1000 UNIT) tablet Commonly known as: VITAMIN D3 Take 1,000 Units by mouth daily.   diclofenac Sodium 1 % Gel Commonly known as: VOLTAREN Apply 2 g topically 4 (four) times daily as needed (for joint pain).   guaifenesin 400 MG Tabs tablet Commonly known as: HUMIBID E Take 400 mg by mouth every 6 (six) hours as needed (cough/congestion).   guaiFENesin-dextromethorphan 100-10 MG/5ML syrup Commonly known  as: ROBITUSSIN DM Take 5 mLs by mouth every 4 (four) hours as needed for cough.   levothyroxine 75 MCG tablet Commonly known as: SYNTHROID Take 1 tablet (75 mcg total) by mouth daily before breakfast.   predniSONE 10 MG tablet Commonly known as: DELTASONE Take 4 tabs by mouth once daily x4 days, then 3 tabs x4 days, 2 tabs x4 days, 1 tab x4 days and stop.   propranolol 20 MG tablet Commonly known as: INDERAL Take 1 tablet (20 mg total) by mouth 3 (three) times daily.    traMADol 50 MG tablet Commonly known as: ULTRAM Take 1 tablet (50 mg total) by mouth every 8 (eight) hours as needed. Started by: Shelda Pal, DO   Trelegy Ellipta 100-62.5-25 MCG/ACT Aepb Generic drug: Fluticasone-Umeclidin-Vilant Inhale 1 puff into the lungs daily.   VITAMIN B-12 PO Take 1 tablet by mouth daily.        Exam BP 120/72    Pulse 93    Temp 97.9 F (36.6 C) (Oral)    Ht 5\' 4"  (1.626 m)    Wt 103 lb 6 oz (46.9 kg)    SpO2 93%    BMI 17.74 kg/m  General:  well developed, well nourished, in no apparent distress Heart: RRR, no LE edema Neck: Supple, symmetric, no thyromegaly Lungs:  CTAB. No respiratory distress Psych: well oriented with normal range of affect and age-appropriate judgement/insight, alert and oriented x4.  Assessment and Plan  Aortic atherosclerosis ([place]) - Plan: Comprehensive metabolic panel, Lipid panel  Postoperative hypothyroidism - Plan: TSH, T4, free  Generalized anxiety disorder - Plan: busPIRone (BUSPAR) 15 MG tablet  Idiopathic hypoparathyroidism (HCC), Chronic  Primary osteoarthritis, unspecified site - Plan: traMADol (ULTRAM) 50 MG tablet  Chronic, stable.  Continue Lipitor 40 mg daily.  Counseled on diet and exercise. Chronic, stable.  Continue levothyroxine 75 mcg daily.  Check labs today. Chronic, stable.  Continue buspirone 15 mg 3 times daily. Monitor calcium levels. Chronic, not controlled.  Continue Voltaren gel and Tylenol.  List of low inflammatory foods provided.  Trial tramadol.  Warnings about this verbalized. F/u in 1 month to recheck #5. The patient voiced understanding and agreement to the plan.  [place], DO 04/05/21 10:37 AM

## 2021-04-05 NOTE — Patient Instructions (Addendum)
Heat (pad or rice pillow in microwave) over affected area, 10-15 minutes twice daily.   Ice/cold pack over area for 10-15 min twice daily.  OK to take Tylenol 1000 mg (2 extra strength tabs) or 975 mg (3 regular strength tabs) every 6 hours as needed.  Foods that may reduce pain: 1) Ginger 2) Blueberries 3) Salmon 4) Pumpkin seeds 5) dark chocolate 6) turmeric 7) tart cherries 8) virgin olive oil 9) chilli peppers 10) mint 11) red wine  Let us know if you need anything.  Hand Exercises Hand exercises can be helpful for almost anyone. These exercises can strengthen the hands, improve flexibility and movement, and increase blood flow to the hands. These results can make work and daily tasks easier. Hand exercises can be especially helpful for people who have joint pain from arthritis or have nerve damage from overuse (carpal tunnel syndrome). These exercises can also help people who have injured a hand. Exercises Most of these hand exercises are gentle stretching and motion exercises. It is usually safe to do them often throughout the day. Warming up your hands before exercise may help to reduce stiffness. You can do this with gentle massage or by placing your hands in warm water for 10-15 minutes. It is normal to feel some stretching, pulling, tightness, or mild discomfort as you begin new exercises. This will gradually improve. Stop an exercise right away if you feel sudden, severe pain or your pain gets worse. Ask your health care provider which exercises are best for you. Knuckle bend or "claw" fist Stand or sit with your arm, hand, and all five fingers pointed straight up. Make sure to keep your wrist straight during the exercise. Gently bend your fingers down toward your palm until the tips of your fingers are touching the top of your palm. Keep your big knuckle straight and just bend the small knuckles in your fingers. Hold this position for 3 seconds. Straighten (extend) your  fingers back to the starting position. Repeat this exercise 5-10 times with each hand. Full finger fist Stand or sit with your arm, hand, and all five fingers pointed straight up. Make sure to keep your wrist straight during the exercise. Gently bend your fingers into your palm until the tips of your fingers are touching the middle of your palm. Hold this position for 3 seconds. Extend your fingers back to the starting position, stretching every joint fully. Repeat this exercise 5-10 times with each hand. Straight fist Stand or sit with your arm, hand, and all five fingers pointed straight up. Make sure to keep your wrist straight during the exercise. Gently bend your fingers at the big knuckle, where your fingers meet your hand, and the middle knuckle. Keep the knuckle at the tips of your fingers straight and try to touch the bottom of your palm. Hold this position for 3 seconds. Extend your fingers back to the starting position, stretching every joint fully. Repeat this exercise 5-10 times with each hand. Tabletop Stand or sit with your arm, hand, and all five fingers pointed straight up. Make sure to keep your wrist straight during the exercise. Gently bend your fingers at the big knuckle, where your fingers meet your hand, as far down as you can while keeping the small knuckles in your fingers straight. Think of forming a tabletop with your fingers. Hold this position for 3 seconds. Extend your fingers back to the starting position, stretching every joint fully. Repeat this exercise 5-10 times with each hand.  Finger spread Place your hand flat on a table with your palm facing down. Make sure your wrist stays straight as you do this exercise. Spread your fingers and thumb apart from each other as far as you can until you feel a gentle stretch. Hold this position for 3 seconds. Bring your fingers and thumb tight together again. Hold this position for 3 seconds. Repeat this exercise 5-10  times with each hand. Making circles Stand or sit with your arm, hand, and all five fingers pointed straight up. Make sure to keep your wrist straight during the exercise. Make a circle by touching the tip of your thumb to the tip of your index finger. Hold for 3 seconds. Then open your hand wide. Repeat this motion with your thumb and each finger on your hand. Repeat this exercise 5-10 times with each hand. Thumb motion Sit with your forearm resting on a table and your wrist straight. Your thumb should be facing up toward the ceiling. Keep your fingers relaxed as you move your thumb. Lift your thumb up as high as you can toward the ceiling. Hold for 3 seconds. Bend your thumb across your palm as far as you can, reaching the tip of your thumb for the small finger (pinkie) side of your palm. Hold for 3 seconds. Repeat this exercise 5-10 times with each hand. Grip strengthening  Hold a stress ball or other soft ball in the middle of your hand. Slowly increase the pressure, squeezing the ball as much as you can without causing pain. Think of bringing the tips of your fingers into the middle of your palm. All of your finger joints should bend when doing this exercise. Hold your squeeze for 3 seconds, then relax. Repeat this exercise 5-10 times with each hand. Contact a health care provider if: Your hand pain or discomfort gets much worse when you do an exercise. Your hand pain or discomfort does not improve within 2 hours after you exercise. If you have any of these problems, stop doing these exercises right away. Do not do them again unless your health care provider says that you can. Get help right away if: You develop sudden, severe hand pain or swelling. If this happens, stop doing these exercises right away. Do not do them again unless your health care provider says that you can. Make sure you discuss any questions you have with your health care provider. Document Revised: 06/26/2018  Document Reviewed: 03/06/2018 Elsevier Patient Education  [place].

## 2021-04-14 DIAGNOSIS — Z20822 Contact with and (suspected) exposure to covid-19: Secondary | ICD-10-CM | POA: Diagnosis not present

## 2021-04-18 DIAGNOSIS — J441 Chronic obstructive pulmonary disease with (acute) exacerbation: Secondary | ICD-10-CM

## 2021-04-18 DIAGNOSIS — E039 Hypothyroidism, unspecified: Secondary | ICD-10-CM

## 2021-04-18 DIAGNOSIS — I2510 Atherosclerotic heart disease of native coronary artery without angina pectoris: Secondary | ICD-10-CM

## 2021-04-18 DIAGNOSIS — D509 Iron deficiency anemia, unspecified: Secondary | ICD-10-CM

## 2021-05-08 ENCOUNTER — Ambulatory Visit: Payer: Medicare Other | Admitting: Family Medicine

## 2021-05-19 ENCOUNTER — Other Ambulatory Visit: Payer: Self-pay | Admitting: Pharmacist

## 2021-05-19 ENCOUNTER — Ambulatory Visit (INDEPENDENT_AMBULATORY_CARE_PROVIDER_SITE_OTHER): Payer: Medicare Other | Admitting: Pharmacist

## 2021-05-19 DIAGNOSIS — I700 Atherosclerosis of aorta: Secondary | ICD-10-CM

## 2021-05-19 DIAGNOSIS — I2510 Atherosclerotic heart disease of native coronary artery without angina pectoris: Secondary | ICD-10-CM

## 2021-05-19 DIAGNOSIS — M1991 Primary osteoarthritis, unspecified site: Secondary | ICD-10-CM

## 2021-05-19 DIAGNOSIS — E200 Idiopathic hypoparathyroidism: Secondary | ICD-10-CM

## 2021-05-19 DIAGNOSIS — J441 Chronic obstructive pulmonary disease with (acute) exacerbation: Secondary | ICD-10-CM

## 2021-05-19 DIAGNOSIS — E039 Hypothyroidism, unspecified: Secondary | ICD-10-CM

## 2021-05-19 MED ORDER — ATORVASTATIN CALCIUM 40 MG PO TABS: 40.0000 mg | ORAL_TABLET | Freq: Every day | ORAL | 3 refills | Status: AC

## 2021-05-19 MED ORDER — TRAMADOL HCL 50 MG PO TABS: 50.0000 mg | ORAL_TABLET | Freq: Three times a day (TID) | ORAL | 2 refills | Status: DC | PRN

## 2021-05-19 NOTE — Telephone Encounter (Signed)
Patient requested refill for tramadol 50mg  - 1 tablet up to every 8 hours as needed.  ?Last refill: 04/05/2021 ?Quantity: 21 ?Pharmacy: [facility] ?

## 2021-05-19 NOTE — Chronic Care Management (AMB) (Signed)
Chronic Care Management Pharmacy Note  05/19/2021 Name:  Andrea Ward MRN:  [id number] DOB:  1945-12-15  Summary:  LDL now at goal and TSH has improved greatly since medication adherence has improved. Sent in updated refill for atorvastatin. Sent request for tramadol refill to PCP.  Reviewed medication list with patient. Updated list.  Will continue to follow patient and assess adherence.   Subjective: Andrea Ward is an 76 y.o. year old female who is a primary patient of Shelda Pal, DO.  The CCM team was consulted for assistance with disease management and care coordination needs.    Engaged with patient by telephone for follow up visit Initial contact was started in response to provider referral for pharmacy case management and/or care coordination services.   Consent to Services:  The patient was given information about Chronic Care Management services, agreed to services, and gave verbal consent prior to initiation of services.  Please see initial visit note for detailed documentation.   Patient Care Team: Shelda Pal, DO as PCP - General (Family Medicine) Buford Dresser, MD as PCP - Cardiology (Cardiology) Juanito Doom, MD as Consulting Physician (Pulmonary Disease) [name], RPH-CPP (Pharmacist) Garner Nash, DO as Consulting Physician (Pulmonary Disease)  Recent office visits: 04/05/2021 - Fam Med (Dr Nani Ravens) follw up anxiety / depression. Prescribed tramadol fro osteoarthritis pain 12/09/2020 - PCP (Dr Nani Ravens) Seen for weakness and balance issues. Refreal for home health PT. Recommended Fiber Supplement for diarrhea     Recent consult visits: 03/30/2021 - Pulmonology (Dr Windell Norfolk) F/U pulmonary nodule; Follow-up after recent bronchoscopy.  Lung nodule on super D CT imaging had dissipated cyst suggestive of being an inflammatory nodule versus malignancy.  Decision was made to cancel her bronchoscopy.  Here today for  follow-up to discuss next steps. Recommended repeat non contrast CT in 6 months; Continue Trelegy for COPD. 03/21/2021 - Procedure - was planning to do bronchoscopy but CT of lung showed that nodule has decreased in size. Bronchoscopy on hold for now. plan for repeat noncontrasted CT scan imaging in 6 months. Also prescribed prednisone taper 02/15/2021 - Pulmonary (Dr Valeta Harms). Discuss lower lobe pulmonary nodule. Had PET scan 02/08/2021. Showed possible concern for malignancy. Plan: bronchoscopy with tentative date of 03/21/2021. 01/16/2021 - GI (Dr Jill Side - [facility]) Phone Note. SBCE shows a few scattered AVMs in the SB ,no active bleeding. Needs Iron replacement per PCP, if cannot tolerate PO Iron then will need IV Iron 01/07/2021 - Pulmonary (Dr Valeta Harms) F/U severe COPD. Continue Trelegy 196mg daily - given sample. F/U in 6 months.  [facility]  New medications at discharge: vancomysin 558mmL - take 2.61m34m 1261m72mery 6 hours for 8 days Medications changed at discharge: Increased levothyroxine to 100mc11mily 6 days per week/ Sundays off.  Medications stopped at discharge: potassium chloride 20 mEq and propranolol 20mg 84mObjective:  Lab Results  Component Value Date   CREATININE 1.03 04/05/2021   CREATININE 0.95 03/21/2021   CREATININE 0.73 06/17/2020    No results found for: HGBA1C Last diabetic Eye exam: No results found for: HMDIABEYEEXA  Last diabetic Foot exam: No results found for: HMDIABFOOTEX      Component Value Date/Time   CHOL 130 04/05/2021 1030   TRIG 90.0 04/05/2021 1030   HDL 60.50 04/05/2021 1030   CHOLHDL 2 04/05/2021 1030   VLDL 18.0 04/05/2021 1030   LDLCALC  51 04/05/2021 1030    Hepatic Function Latest Ref Rng & Units 04/05/2021 06/17/2020 02/10/2019  Total Protein 6.0 - 8.3 g/dL 6.6 7.2 7.7  Albumin 3.5 - 5.2 g/dL 3.7 3.5 3.9  AST 0 - 37 U/L 14 12 15   ALT 0  - 35 U/L 10 5 5   Alk Phosphatase 39 - 117 U/L 56 67 62  Total Bilirubin 0.2 - 1.2 mg/dL 0.4 0.4 0.5  Bilirubin, Direct 0.1 - 0.5 mg/dL - - -    Lab Results  Component Value Date/Time   TSH 5.76 (H) 04/05/2021 10:30 AM   TSH 178.18 (H) 01/04/2021 03:17 PM   FREET4 1.15 04/05/2021 10:30 AM   FREET4 <0.01 (L) 01/04/2021 03:17 PM    CBC Latest Ref Rng & Units 03/21/2021 02/26/2019 02/10/2019  WBC 4.0 - 10.5 K/uL 4.8 7.2 7.6  Hemoglobin 12.0 - 15.0 g/dL 10.4(L) 10.8(L) 9.7(L)  Hematocrit 36.0 - 46.0 % 34.7(L) 34.7(L) 31.8(L)  Platelets 150 - 400 K/uL 364 318 396    No results found for: VD25OH  Clinical ASCVD: Yes  The 10-year ASCVD risk score (Arnett DK, et al., 2019) is: 35.6%   Values used to calculate the score:     Age: 41 years     Sex: Female     Is Non-Hispanic African American: Yes     Diabetic: Yes     Tobacco smoker: Yes     Systolic Blood Pressure: 301 mmHg     Is BP treated: Yes     HDL Cholesterol: 60.5 mg/dL     Total Cholesterol: 130 mg/dL     Social History   Tobacco Use  Smoking Status Some Days   Years: 50.00   Types: Cigarettes   Start date: 03/19/1965   Last attempt to quit: 11/10/2018   Years since quitting: 2.5  Smokeless Tobacco Never  Tobacco Comments   stopped smoknig august/2020!   BP Readings from Last 3 Encounters:  04/05/21 120/72  03/30/21 (!) 142/68  03/21/21 (!) 186/66   Pulse Readings from Last 3 Encounters:  04/05/21 93  03/30/21 (!) 102  03/21/21 89   Wt Readings from Last 3 Encounters:  04/05/21 103 lb 6 oz (46.9 kg)  03/30/21 103 lb 3.2 oz (46.8 kg)  03/21/21 102 lb (46.3 kg)    Assessment: Review of patient past medical history, allergies, medications, health status, including review of consultants reports, laboratory and other test data, was performed as part of comprehensive evaluation and provision of chronic care management services.   SDOH:  (Social Determinants of Health) assessments and interventions performed:      CCM Care Plan  No Known Allergies  Medications Reviewed Today     Reviewed by [name], RPH-CPP (Pharmacist) on 05/19/21 at 9  Med List Status: <None>   Medication Order Taking? Sig Documenting Provider Last Dose Status Informant  acetaminophen (TYLENOL) 325 MG tablet [id number] Yes Take 650 mg by mouth every 6 (six) hours as needed (pain). [provider] Taking Active Self  albuterol (PROVENTIL) (2.5 MG/3ML) 0.083% nebulizer solution [id number] Yes Take 3 mLs (2.5 mg total) by nebulization every 6 (six) hours as needed for wheezing or shortness of breath. Shelda Pal, DO Taking Active Self  atorvastatin (LIPITOR) 40 MG tablet [id number] Yes Take 1 tablet (40 mg total) by mouth daily. Shelda Pal, DO Taking Active Self  busPIRone (BUSPAR) 15 MG tablet [id number] Yes Take 1 tablet (15 mg total) by mouth 3 (three) times daily. Shelda Pal,  DO Taking Active   calcitRIOL (ROCALTROL) 0.5 MCG capsule [id number] Yes Take 1 mcg by mouth daily. [provider] Taking Active Self  calcium carbonate (TUMS - DOSED IN MG ELEMENTAL CALCIUM) 500 MG chewable tablet [id number] Yes Chew 1 tablet by mouth in the morning, at noon, and at bedtime. [provider] Taking Active Self  cholecalciferol (VITAMIN D3) 25 MCG (1000 UNIT) tablet [id number] Yes Take 1,000 Units by mouth daily. [provider] Taking Active Self  Cyanocobalamin (VITAMIN B-12 PO) [id number] Yes Take 1 tablet by mouth daily. [provider] Taking Active Self  diclofenac Sodium (VOLTAREN) 1 % GEL [id number] Yes Apply 2 g topically 4 (four) times daily as needed (for joint pain). [provider] Taking Active   Fluticasone-Umeclidin-Vilant (TRELEGY ELLIPTA) 100-62.5-25 MCG/INH AEPB [id number] Yes Inhale 1 puff into the lungs daily. Shelda Pal, DO Taking Active Self  guaifenesin (HUMIBID E) 400 MG TABS tablet [id number] Yes Take 400 mg by  mouth every 6 (six) hours as needed (cough/congestion). [provider] Taking Active Self           Med Note Antony Contras, Aprel Egelhoff B   Fri Mar 24, 2021  3:32 PM) Uses tablets OR syrup  guaiFENesin-dextromethorphan (ROBITUSSIN DM) 100-10 MG/5ML syrup [id number] Yes Take 5 mLs by mouth every 4 (four) hours as needed for cough. [provider] Taking Active Self           Med Note Antony Contras, Mellony Danziger B   Fri Mar 24, 2021  3:33 PM) Uses tablets OR syrup  levothyroxine (SYNTHROID) 75 MCG tablet [id number] Yes Take 1 tablet (75 mcg total) by mouth daily before breakfast. Shelda Pal, DO Taking Active   propranolol (INDERAL) 20 MG tablet [id number] Yes Take 1 tablet (20 mg total) by mouth 3 (three) times daily. Shelda Pal, DO Taking Active   traMADol (ULTRAM) 50 MG tablet [id number] Yes Take 1 tablet (50 mg total) by mouth every 8 (eight) hours as needed. Shelda Pal, DO Taking Active   VENTOLIN HFA 108 [id number] Base) MCG/ACT inhaler [id number] Yes INHALE 1 TO 2 PUFFS BY MOUTH EVERY 6 HOURS AS NEEDED FOR WHEEZING AND FOR SHORTNESS OF BREATH Shelda Pal, DO Taking Active             Patient Active Problem List   Diagnosis Date Noted   Primary osteoarthritis 04/05/2021   Lung nodule 02/16/2021   Physical deconditioning 12/09/2020   Balance problem 12/09/2020   Aortic atherosclerosis ([place])    Bilateral carotid artery stenosis 08/27/2019   Coronary artery calcification seen on CT scan 08/27/2019   Idiopathic hypoparathyroidism ([place]) 02/02/2019   Ischemic colitis ([place]) 02/02/2019   Medication management 01/05/2019   Healthcare maintenance 12/17/2018   Former smoker 04/14/2018   Pulmonary emphysema ([place]) 01/09/2018   Tremor 08/19/2017   Tachycardia 07/10/2017   Hypothyroid    Hypothyroidism 06/12/2016   Pharyngeal dysphagia 06/12/2016   Quality of voice, hoarse 06/12/2016   Goiter 05/23/2016   GERD (gastroesophageal reflux disease) 05/23/2016    Generalized anxiety disorder 05/23/2016    Immunization History  Administered Date(s) Administered   Moderna Sars-Covid-2 Vaccination 07/09/2019, 08/06/2019   Pneumococcal Conjugate-13 05/23/2016   Pneumococcal Polysaccharide-23 07/10/2017   Tdap 05/23/2016    Conditions to be addressed/monitored: CAD, HTN, HLD, COPD, and hypothyroidism; hypoparathyroidism, low calcium; anemia; AVMs; anxiety; emphysema / COPD, GAD; joint pain, GERD  Care Plan : General Pharmacy (Adult)  Updates made by [name], RPH-CPP since 05/19/2021 12:00 AM  Problem: Emphysema / COPD; HDL; CAD / coronary artery calcium; hypothyroidism; hypoparathyrroidism; tremor; GAD; GERD; joint pain   Priority: High     Long-Range Goal: Pharmacy goals related to chronic care and medication management   Start Date: 09/29/2020  Recent Progress: On track  Priority: High  Note:   Current Barriers:  Unable to independently afford treatment regimen Unable to achieve control of COPD  Does not adhere to prescribed medication regimen Recent hospitalization with medication changes. Patient education needed (completed)   Pharmacist Clinical Goal(s):  Over the next 90 days, patient will verbalize ability to afford treatment regimen achieve control of COPD as evidenced by decreased exacerbations and increased use of maintenance inhaler maintain control of HDL as evidenced by LDL <70  adhere to prescribed medication regimen as evidenced by refill history  through collaboration with PharmD and provider.   Interventions: 1:1 collaboration with Shelda Pal, DO regarding development and update of comprehensive plan of care as evidenced by provider attestation and co-signature Inter-disciplinary care team collaboration (see longitudinal plan of care) Comprehensive medication review performed; medication list updated in electronic medical record  Hyperlipidemia: At goal;  LDL goal <70 - last LDL was 51 (improved from  77)  Noted to have carotid artery stenosis, aortic atherosclerosis and positive coronary artery calcium on CT scan  Current treatment: atorvastatin 14m daily  Medications previously tried: none  LDL improved with better adherence! Interventions:   Educated on LDL goals Recommended patient continue to take atorvastatin 462mdaily  Recommended she limit intake of saturated and trans fat Updated prescription for atorvastatin for patient #90  3 RF  Chronic Obstructive Pulmonary Disease: Improved since started Trelegy.  Goal: reduce shortness of breath / wheezing and prevent exacerbations Managed by Dr IkNemiah Commanderpulmonologist  Monitoring pulmonary nodule Current treatment:  Trelegy Inhaler 10077m- inhale one puff into lungs once a day Albuterol inhaler or nebs - use up to every 6 hours as needed for shortness of breath / wheezine.   Use of maintenance inhaler: patient endorses she is using every day Use of rescue inhaler: using less frequently; < daily GOLD Classification: D MMRC/CAT score: 18 1 exacerbations requiring treatment in the last 6 months  Interventions: Reviewed maintenance versus rescue inhalers Patient endorses she has #3 inhalers of Trelegy on hand. Will check back in 6 weeks to assist in reordering Trelegy from patient assistance program (refill line for [facility] assistance program [phone] [id number]#: [id number] to follow up with pulmonology   Anemia due to blood loss:  Past hospitalization in 2022 with HBG of 4.1 on admission. 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  Had capsule endoscopy 01/11/2021 - no actively bleeding AVMs noted per GI report Last Hemogobin 03/2021 was a little low at 10.4 but has been stable.  Patient states she found a bottle of meloxicam and was not sure is she was supposed to take it.  Current Treatment:  None Interventions: Patient reminded to avoid meloxicam and all other NSAIDS. Recommended she  removed bottle of meloxicam from her active medications so she won't get confused. If having pain, headache or fever can use acetaminophen 325m36mup to 2 tablets every 6 hours. (Ok to use topical Voltaren on joints as needed)  Continue to monitor CBC  Osteopenia / hypocalcemia / hypoparathyroidism / hypothyroidism:  [facility]in[facility] and fatigue improving. Last TSH was improved when rechecked in  January 2023.  Last serum calcium was WNL (03/2021) DEXA 05/21/2016: Femur Total Left T-score -2.1. AP Spine L1-L4 T-Score -1.9  Frax estimate: Major Osteoporotic Fracture: 8.4% / Hip Fracture: 2.0% Current treatment:  Calcium carbonate 1048m 3 times a day Vitamin D3 - 1000 units daily Calcitriol 0.5108m daily Levothyroxine 7556m- take 1 tablet daily 6 days per week, none on Sundays. (Restarted 01/05/2021) Interventions:  Continue to follow with Dr PatPosey Prontogarding hypoparathyroidism and hypocalcemia.  Reminded patient to take levothyroxine every day - [facility] Consider rechecking DEXA  Medication Management:  [facility] - confirmed with [facility] She is using a discount coupon which helps keep most of her medications at $12 or less per 90 days except Trelegy.  Interventions:  Reviewed refill history. Assisted with requesting needed refills - levothyroxine, atorvastatin and tramadol (sent request for tramadol to PCP for review and approval)  Will continue to follow adherence and labs.    Patient Goals/Self-Care Activities Over the next 90 days, patient will:  take medications as prescribed,  focus on medication adherence by using pill container if needed, and  collaborate with provider on medication access solutions and with getting Trelegy from patient assistance program. Pick up levothyroxine, atorvastatin refills.  Remember to avoid  meloxicam and all other NSAIDS (ibuprofen or naproxen). Removed bottle of meloxicam from active medications so you won't get confused. If having pain, headache or fever can use acetaminophen 325m26mup to 2 tablets every 6 hours. (Ok to use topical Voltaren on joints as needed  Follow Up Plan: Telephone follow up appointment with care management team member scheduled for:  6 weeks.           Medication Assistance:  Trelegy obtained through [employer] assistance program.  Enrollment ends 01/16/2022  Confirmed today patient was approved 01/11/2021 thru 01/16/2022  Patient[place] [facility] [id number]: [phone]: [phone] Up:  Patient agrees to Care Plan and Follow-up.  Plan: Telephone follow up appointment with care management team member scheduled for:  6 weeks  TammCherre RobinsarmD Clinical Pharmacist [facility]

## 2021-05-19 NOTE — Patient Instructions (Signed)
Andrea Ward ?It was a pleasure speaking with you today.  ?I have attached a summary of our visit today and information about your health goals.  ?(See below)  ? ?If you have any questions or concerns, please feel free to contact me either at the phone number below or with a MyChart message.  ? ?Keep up the good work! ? ?[name], PharmD ?Clinical Pharmacist ?[facility] ?[place] ?[phone] (direct line)  ?[phone] (main office number) ? ?Chronic Care Management Care Plan (updated 05/19/2021) ? ?Hyperlipidemia: ?At goal; LDL goal <70 - last LDL was 51 - Great job!  ?Current treatment: ?atorvastatin 40mg  daily  ?Interventions:   ?Educated on LDL goals ?Recommended patient continue to take atorvastatin 40mg  daily  ?Recommended she limit intake of saturated and trans fat ? ?Chronic Obstructive Pulmonary Disease: ?improving since started Trelegy:  ?Current treatment: ?Trelegy Inhaler 115mcg - inhale one puff into lungs once a day  ?Albuterol inhaler or nebulized solution use as needed up to every 6 hour for shortness of breath or wheezing.  ?Interventions: ?Reviewed maintenance versus rescue inhalers ?Continue to follow up with [name] ? ?Osteopenia / hypocalcemia / hypoparathyroidism / hypothyroidism / fatigue:  ?Managed by endocrinologist - [name]  ?Current treatment:  ?Calcium + vitamin D  ?Levothyroxine 51mcg daily ?Calcitriol 0.22mcg daily ?Interventions:  ?Continue to follow with [name]  ?Consider rechecking DEXA ?Requested levothyroxine refill from [facility] ? ?Anemia (low hemoglobin or iron) due to blood loss:  ?Current Treatment:  ?none ?Interventions: ?Patient instructed to avoid meloxicam and all other NSAIDS (ibuprofen/ Advil or Motrin and naproxen / Aleve) . If having pain, headache or fever can use acetaminophen 325mg  - up to 2 tablets every 6 hours.  ? ? ?Patient Goals/Self-Care Activities ?Over the next 90 days, patient will:  ?take medications as prescribed,  ?focus  on medication adherence by using pill container if needed, and  ?collaborate with provider on medication access solutions and with getting Trelegy from patient assistance program. ?Pick up levothyroxine, atorvastatin refills.  ?Remember to avoid meloxicam and all other NSAIDS (ibuprofen or naproxen). Removed bottle of meloxicam from active medications so you won't get confused. If having pain, headache or fever can use acetaminophen 325mg  - up to 2 tablets every 6 hours. (Ok to use topical Voltaren on joints as needed ? ? ?Follow Up Plan: Telephone follow up appointment with care management team member scheduled for:  6 weeks. ?Patient verbalizes understanding of instructions and care plan provided today and agrees to view in [place]. Active MyChart status confirmed with patient.    ?

## 2021-05-31 ENCOUNTER — Encounter: Payer: Self-pay | Admitting: Family Medicine

## 2021-05-31 ENCOUNTER — Ambulatory Visit (INDEPENDENT_AMBULATORY_CARE_PROVIDER_SITE_OTHER): Payer: Medicare Other | Admitting: Family Medicine

## 2021-05-31 VITALS — BP 122/84 | HR 90 | Temp 98.0°F | Ht 64.0 in | Wt 97.5 lb

## 2021-05-31 DIAGNOSIS — M1991 Primary osteoarthritis, unspecified site: Secondary | ICD-10-CM | POA: Diagnosis not present

## 2021-05-31 DIAGNOSIS — J441 Chronic obstructive pulmonary disease with (acute) exacerbation: Secondary | ICD-10-CM

## 2021-05-31 DIAGNOSIS — R251 Tremor, unspecified: Secondary | ICD-10-CM

## 2021-05-31 MED ORDER — PROPRANOLOL HCL 40 MG PO TABS: 40.0000 mg | ORAL_TABLET | Freq: Three times a day (TID) | ORAL | 2 refills | Status: AC

## 2021-05-31 MED ORDER — PREDNISONE 20 MG PO TABS: 40.0000 mg | ORAL_TABLET | Freq: Every day | ORAL | 0 refills | Status: AC

## 2021-05-31 NOTE — Patient Instructions (Signed)
Let me know if/when you need a refill of the tramadol.  ? ?We are increasing the dosage of your propranolol.  ? ?Stay on your Trelegy.  ? ?Let us know if you need anything. ?

## 2021-05-31 NOTE — Progress Notes (Signed)
Chief Complaint  ?Patient presents with  ? Follow-up  ? ? ?Subjective: ?Patient is a 76 y.o. female here for f/u. ? ?OA- hx of OA that is intermittently severe. She was started on tramadol and had improvement. Takes 1/2 tab daily as needed, not every day. Works well. No AE's.  ? ?2-3 mo ago, started having worsening tremor. Compliant w propranolol 20 mg TID, no AE's.  It is now affecting both upper extremities, spreading from just the left upper extremity.  She is not having any weakness or dropping things. ? ?Over the last couple weeks, the patient has had more wheezing and shortness of breath.  She has more congestion and secretions as well.  She has history of COPD and is compliant with her Trelegy.  She denies any fevers or other upper respiratory symptoms.  She denies a history of any allergies. ? ?Past Medical History:  ?Diagnosis Date  ? Anemia   ? 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 Aortic atherosclerosis ([place])   ? Carotid artery disease ([place])   ? Emphysema of lung ([place])   ? Generalized anxiety disorder 05/23/2016  ? GERD (gastroesophageal reflux disease) 05/23/2016  ? History of pregnancy induced hypertension   ? Hypoparathyroidism ([place])   ? Acquired post surg  ? Hypothyroid   ? ? ?Objective: ?BP 122/84   Pulse 90   Temp 98 ?F (36.7 ?C) (Oral)   Ht '5\' 4"'$  (1.626 m)   Wt 97 lb 8 oz (44.2 kg)   SpO2 97%   BMI 16.74 kg/m?  ?General: Awake, appears stated age ?HEENT: Ear canals are patent, TMs are negative bilaterally, nares are patent without discharge, MMM, no pharyngeal exudate or erythema ?Heart: RRR, no LE edema ?Lungs: Diffuse expiratory wheezes. No accessory muscle use ?Neuro: Intention tremor noted in bilateral upper extremities, gait is slow/cautious ?Psych: Age appropriate judgment and insight, normal affect and mood ? ?Assessment and Plan: ?Primary osteoarthritis, unspecified site ? ?Tremor - Plan: propranolol (INDERAL) 40 MG tablet ? ?COPD  exacerbation ([place]) - Plan: predniSONE (DELTASONE) 20 MG tablet ? ?Chronic, stable.  Continue Tylenol, routine activity, tramadol as needed. ?Chronic, not controlled.  Increase propranolol from 20 mg 3 times daily to 40 mg 3 times daily.  Follow-up in 1 month to recheck this. ?Exacerbation of chronic issue.  5-day prednisone burst 40 mg daily.  Rescue inhaler as needed.  Continue Trelegy. ?The patient voiced understanding and agreement to the plan. ? ?Shelda Pal, DO ?05/31/21  ?12:55 PM ? ? ? ? ?

## 2021-06-07 DIAGNOSIS — Z20822 Contact with and (suspected) exposure to covid-19: Secondary | ICD-10-CM | POA: Diagnosis not present

## 2021-06-16 DIAGNOSIS — E039 Hypothyroidism, unspecified: Secondary | ICD-10-CM

## 2021-06-16 DIAGNOSIS — J441 Chronic obstructive pulmonary disease with (acute) exacerbation: Secondary | ICD-10-CM

## 2021-06-16 DIAGNOSIS — I2510 Atherosclerotic heart disease of native coronary artery without angina pectoris: Secondary | ICD-10-CM

## 2021-06-28 ENCOUNTER — Ambulatory Visit (INDEPENDENT_AMBULATORY_CARE_PROVIDER_SITE_OTHER): Payer: Medicare Other | Admitting: Family Medicine

## 2021-06-28 ENCOUNTER — Encounter: Payer: Self-pay | Admitting: Family Medicine

## 2021-06-28 VITALS — BP 122/75 | HR 83 | Temp 98.2°F | Ht 64.0 in | Wt 100.0 lb

## 2021-06-28 DIAGNOSIS — R251 Tremor, unspecified: Secondary | ICD-10-CM

## 2021-06-28 DIAGNOSIS — R64 Cachexia: Secondary | ICD-10-CM

## 2021-06-28 DIAGNOSIS — G4700 Insomnia, unspecified: Secondary | ICD-10-CM

## 2021-06-28 MED ORDER — MIRTAZAPINE 15 MG PO TABS: 15.0000 mg | ORAL_TABLET | Freq: Every day | ORAL | 2 refills | Status: AC

## 2021-06-28 NOTE — Patient Instructions (Signed)
We will stay on the current propranolol dosage. ? ?Aim to do some physical exertion for 150 minutes per week. This is typically divided into 5 days per week, 30 minutes per day. The activity should be enough to get your heart rate up. Anything is better than nothing if you have time constraints. ? ?Sleep Hygiene Tips: ?Do not watch TV or look at screens within 1 hour of going to bed. If you do, make sure there is a blue light filter (nighttime mode) involved. ?Try to go to bed around the same time every night. Wake up at the same time within 1 hour of regular time. Ex: If you wake up at 7 AM for work, do not sleep past 8 AM on days that you don't work. ?Do not drink alcohol before bedtime. ?Do not consume caffeine-containing beverages after noon or within 9 hours of intended bedtime. ?Get regular exercise/physical activity in your life, but not within 2 hours of planned bedtime. ?Do not take naps.  ?Do not eat within 2 hours of planned bedtime. ?Melatonin, 3-5 mg 30-60 minutes before planned bedtime may be helpful.  ?The bed should be for sleep or sex only. If after 20-30 minutes you are unable to fall asleep, get up and do something relaxing. Do this until you feel ready to go to sleep again.  ? ?Sleep is important to [place] all. Getting good sleep is imperative to adequate functioning during the day. Work with our counselors who are trained to help people obtain quality sleep. Call [phone] to schedule an appointment or if you are curious about insurance coverage/cost. ? ?Let us know if you need anything. ?

## 2021-06-28 NOTE — Progress Notes (Signed)
Chief Complaint  ?Patient presents with  ? Follow-up  ?  Sob ?  ? ? ?Subjective: ?Patient is a 76 y.o. female here for f/u. ? ?Patient is following up for ?essential tremor.  Her propranolol dosage was increased from 20 mg 3 times daily to 40 mg 3 times daily.  She reports compliance and is tolerating medicine well.  She reports significant improvement in her tremor.  She does not wish to change doses at this time. ? ?For the past few months she has had issues falling asleep.  She will sometimes get only several hours of sleep and does feel tired during the day.  Additionally, she feels that she is losing weight with her appetite decreasing.  This sometimes will wax and wane.  She tends to snack throughout the day rather than eat full meals. ? ?Past Medical History:  ?Diagnosis Date  ? Anemia   ? 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 Aortic atherosclerosis ([place])   ? Carotid artery disease ([place])   ? Emphysema of lung ([place])   ? Generalized anxiety disorder 05/23/2016  ? GERD (gastroesophageal reflux disease) 05/23/2016  ? History of pregnancy induced hypertension   ? Hypoparathyroidism ([place])   ? Acquired post surg  ? Hypothyroid   ? ? ?Objective: ?BP 122/75   Pulse 83   Temp 98.2 ?F (36.8 ?C) (Oral)   Ht '5\' 4"'$  (1.626 m)   Wt 100 lb (45.4 kg)   SpO2 94%   BMI 17.16 kg/m?  ?General: Awake, appears stated age ?Heart: RRR, no LE edema ?Lungs: Diffuse exp wheezing faintly heard. No accessory muscle use ?Neuro: Gait is slow and cautious. Intention tremor noted in both hands.  ?Psych: Age appropriate judgment and insight, normal affect and mood ? ?Assessment and Plan: ?Tremor ? ?Insomnia, unspecified type - Plan: mirtazapine (REMERON) 15 MG tablet ? ?Cachexia ([place]) - Plan: mirtazapine (REMERON) 15 MG tablet ? ?Chronic, stable.  Continue propranolol 40 mg 3 times daily. ?Chronic, not controlled.  Start mirtazapine 15 mg nightly. ?Patient reports subjective worsening of appetite,  will hopefully mirtazapine will help stimulate her appetite more.  I would like to recheck this in 1 month. ?The patient voiced understanding and agreement to the plan. ? ?Shelda Pal, DO ?06/28/21  ?11:13 AM ? ? ? ? ?

## 2021-07-03 ENCOUNTER — Ambulatory Visit (INDEPENDENT_AMBULATORY_CARE_PROVIDER_SITE_OTHER): Payer: Medicare Other | Admitting: Pharmacist

## 2021-07-03 DIAGNOSIS — R64 Cachexia: Secondary | ICD-10-CM

## 2021-07-03 DIAGNOSIS — E200 Idiopathic hypoparathyroidism: Secondary | ICD-10-CM

## 2021-07-03 DIAGNOSIS — I700 Atherosclerosis of aorta: Secondary | ICD-10-CM

## 2021-07-03 DIAGNOSIS — G4700 Insomnia, unspecified: Secondary | ICD-10-CM

## 2021-07-03 DIAGNOSIS — E890 Postprocedural hypothyroidism: Secondary | ICD-10-CM

## 2021-07-03 DIAGNOSIS — I2510 Atherosclerotic heart disease of native coronary artery without angina pectoris: Secondary | ICD-10-CM

## 2021-07-03 MED ORDER — TRELEGY ELLIPTA 100-62.5-25 MCG/ACT IN AEPB: 1.0000 | INHALATION_SPRAY | Freq: Every day | RESPIRATORY_TRACT | 3 refills | Status: AC

## 2021-07-03 NOTE — Patient Instructions (Signed)
Andrea Ward,  ?It was a pleasure speaking with you  ?Below is a summary of your health goals and care plan ? ? ?If you have any questions or concerns, please feel free to contact me either at the phone number below or with a MyChart message.  ? ?Keep up the good work! ? ?[name], PharmD ?Clinical Pharmacist ?[facility] ?[place] ?[phone] (direct line)  ?[phone] (main office number) ? ? ?Current Barriers:  ?Unable to independently afford treatment regimen ?Unable to achieve control of COPD  ?Unable to adhere to prescribed medication regimen ? ?Pharmacist Clinical Goal(s):  ?Over the next 90 days, patient will verbalize ability to afford treatment regimen ?achieve control of COPD as evidenced by decreased exacerbations and increased use of maintenance inhaler ?maintain control of elevated cholesterol as evidenced by LDL <70  ?adhere to prescribed medication regimen as evidenced by refill history through collaboration with PharmD and provider.  ? ?Interventions: ?1:1 collaboration with Shelda Pal, DO regarding development and update of comprehensive plan of care as evidenced by provider attestation and co-signature ?Inter-disciplinary care team collaboration (see longitudinal plan of care) ?Comprehensive medication review performed; medication list updated in electronic medical record ? ?Hyperlipidemia: ?At goal; LDL goal <70 - last LDL was 51 - Great job!  ?Current treatment: ?atorvastatin '40mg'$  daily  ?Interventions:   ?Educated on LDL goals ?Recommended patient continue to take atorvastatin '40mg'$  daily  ?Recommended she limit intake of saturated and trans fat ? ?Chronic Obstructive Pulmonary Disease: ?improving since started Trelegy:  ?Current treatment: ?Trelegy Inhaler 178mg - inhale one puff into lungs once a day  ?Albuterol inhaler or nebulized solution use as needed up to every 6 hour for shortness of breath or wheezing.  ?Interventions: ?Reviewed maintenance  versus rescue inhalers ?Continue to follow up with Dr INemiah Commander? ?Osteopenia / hypocalcemia / hypoparathyroidism / hypothyroidism / fatigue:  ?Managed by endocrinologist - Dr PPosey Pronto ?Current treatment:  ?Calcium + vitamin D  ?Levothyroxine 738m daily ?Calcitriol 0.30m59m- take 2 capsules (= 1mc7mdaily ?Interventions:  ?Continue to follow with Dr PatePosey Prontoonsider rechecking DEXA ?[facility] or [facility] due to blood loss:  ?Current Treatment:  ?none ?Interventions: ?Patient instructed to avoid meloxicam and all other NSAIDS (ibuprofen/ Advil or Motrin and naproxen / Aleve) . If having pain, headache or fever can use acetaminophen '325mg'$  - up to 2 tablets every 6 hours.  ? ? ?Patient Goals/Self-Care Activities ?Over the next 90 days, patient will:  ?take medications as prescribed,  ?focus on medication adherence by using pill container if needed, and  ?collaborate with provider on medication access solutions and with getting Trelegy from patient assistance program. ?Pick up levothyroxine, atorvastatin refills.  ?Remember to avoid meloxicam and all other NSAIDS (ibuprofen or naproxen).  If having pain, headache or fever can use acetaminophen '325mg'$  - up to 2 tablets every 6 hours. (Ok to use topical Voltaren on joints as needed ? ? ?Follow Up Plan: Telephone follow up appointment with care management team member scheduled for:  3 months ? ?Patient verbalizes understanding of instructions and care plan provided today and agrees to view in [facility] MyChart status confirmed with patient.    ?

## 2021-07-03 NOTE — Chronic Care Management (AMB) (Signed)
? ? ?Chronic Care Management ?Pharmacy Note ? ?07/03/2021 ?Name:  Andrea Ward MRN:  [id number] DOB:  02-06-1946 ? ?Summary:  ?Patient reports sleep has improved since mirtazapine was started last week. Has not noticed a change in appetite yet but might take a few weeks.  ?LDL at goal and TSH has improved greatly since medication adherence has improved. ?Patient endorses she has #1.5 inhalers of Trelegy on hand. Called [employer] patient assistance program to request refills for Trelegy. Updated Rx needed. Printed for PCP to review and sign and then will fax to patient assistance program. ?Patient states she has not had Calcitrol filled in several months. Coordinated with Dr Andrea Ward office and pharmacy to refill calcitriol ?Will continue to follow patient and assess adherence.  ? ?Subjective: ?Andrea Ward is an 76 y.o. year old female who is a primary patient of Shelda Pal, DO.  The CCM team was consulted for assistance with disease management and care coordination needs.   ? ?Engaged with patient by telephone for follow up visit Initial contact was started in response to provider referral for pharmacy case management and/or care coordination services.  ? ?Consent to Services:  ?The patient was given information about Chronic Care Management services, agreed to services, and gave verbal consent prior to initiation of services.  Please see initial visit note for detailed documentation.  ? ?Patient Care Team: ?Shelda Pal, DO as PCP - General (Family Medicine) ?Buford Dresser, MD as PCP - Cardiology (Cardiology) ?Juanito Doom, MD as Consulting Physician (Pulmonary Disease) ?[name], RPH-CPP (Pharmacist) ?Garner Nash, DO as Consulting Physician (Pulmonary Disease) ? ?Recent office visits: ?06/28/2021 - PCP (Dr Nani Ravens) F/U tremor and insomnia. Added mirtazepine 39m at bedtime for insomnia and cachexia.  ?05/31/2021 - PCP (Dr WNani Ravens Seen for worsening tremors and  incresae wheezing and shortness of breath. Increased propranolol to 462m3 times a day; prescribed prednisone 4078maily for 5 days.  ?04/05/2021 - Fam Med (Dr WenNani Ravensollw up anxiety / depression. Prescribed tramadol fro osteoarthritis pain ? ? ?Recent consult visits: ?03/30/2021 - Pulmonology (Dr IkaWindell [place]/U pulmonary nodule; Follow-up after recent bronchoscopy.  Lung nodule on super D CT imaging had dissipated cyst suggestive of being an inflammatory nodule versus malignancy.  Decision was made to cancel her bronchoscopy.  Here today for follow-up to discuss next steps. Recommended repeat non contrast CT in 6 months; Continue Trelegy for COPD. ?03/21/2021 - Procedure - was planning to do bronchoscopy but CT of lung showed that nodule has decreased in size. Bronchoscopy on hold for now. plan for repeat noncontrasted CT scan imaging in 6 months. Also prescribed prednisone taper ?02/15/2021 - Pulmonary (Dr IcaValeta HarmsDiscuss lower lobe pulmonary nodule. Had PET scan 02/08/2021. Showed possible concern for malignancy. Plan: bronchoscopy with tentative date of 03/21/2021. ?01/16/2021 [facility] SBCE shows a few scattered AVMs in the SB ,no active bleeding. Needs Iron replacement per PCP, if cannot tolerate PO Iron then will need IV Iron ?01/07/2021 - Pulmonary (Dr IcaValeta [facility]/U severe COPD. Continue Trelegy 100m7maily - given sample. F/U in 6 months. ? ?Hospital visits: ?None in last 6 months. ? ? ?Objective: ? ?Lab Results  ?Component Value Date  ? CREATININE 1.03 04/05/2021  ? CREATININE 0.95 03/21/2021  ? CREATININE 0.73 06/17/2020  ? ? ?No results found for: HGBA1C ?Last diabetic Eye exam: No results found for: HMDIABEYEEXA  ?Last diabetic Foot exam: No results found for: HMDIABFOOTEX  ? ?   ?Component  Value Date/Time  ? CHOL 130 04/05/2021 1030  ? TRIG 90.0 04/05/2021 1030  ? HDL 60.50 04/05/2021 1030  ? CHOLHDL 2 04/05/2021 1030  ? VLDL 18.0 04/05/2021 1030  ? LDLCALC 51 04/05/2021  1030  ? ? ? ?  Latest Ref Rng & Units 04/05/2021  ? 10:30 AM 06/17/2020  ? 11:58 AM 02/10/2019  ?  1:41 PM  ?Hepatic Function  ?Total Protein 6.0 - 8.3 g/dL 6.6   7.2   7.7    ?Albumin 3.5 - 5.2 g/dL 3.7   3.5   3.9    ?AST 0 - 37 U/L 14   12   15     ?ALT 0 - 35 U/L 10   5   5     ?Alk Phosphatase 39 - 117 U/L 56   67   62    ?Total Bilirubin 0.2 - 1.2 mg/dL 0.4   0.4   0.5    ? ? ?Lab Results  ?Component Value Date/Time  ? TSH 5.76 (H) 04/05/2021 10:30 AM  ? TSH 178.18 (H) 01/04/2021 03:17 PM  ? FREET4 1.15 04/05/2021 10:30 AM  ? FREET4 <0.01 (L) 01/04/2021 03:17 PM  ? ? ? ?  Latest Ref Rng & Units 03/21/2021  ?  7:09 AM 02/26/2019  ?  8:05 AM 02/10/2019  ?  1:41 PM  ?CBC  ?WBC 4.0 - 10.5 K/uL 4.8   7.2   7.6    ?Hemoglobin 12.0 - 15.0 g/dL 10.4   10.8   9.7    ?Hematocrit 36.0 - 46.0 % 34.7   34.7   31.8    ?Platelets 150 - 400 K/uL 364   318   396    ? ? ?No results found for: VD25OH ? ?Clinical ASCVD: Yes  ?The 10-year ASCVD risk score (Arnett DK, et al., 2019) is: 38.2% ?  Values used to calculate the score: ?    Age: 43 years ?    Sex: Female ?    Is Non-Hispanic African American: Yes ?    Diabetic: Yes ?    Tobacco smoker: Yes ?    Systolic Blood Pressure: 878 mmHg ?    Is BP treated: Yes ?    HDL Cholesterol: 60.5 mg/dL ?    Total Cholesterol: 130 mg/dL   ? ? ?Social History  ? ?Tobacco Use  ?Smoking Status Some Days  ? Years: 50.00  ? Types: Cigarettes  ? Start date: 03/19/1965  ? Last attempt to quit: 11/10/2018  ? Years since quitting: 2.6  ?Smokeless Tobacco Never  ?Tobacco Comments  ? stopped smoknig august/2020!  ? ?BP Readings from Last 3 Encounters:  ?06/28/21 122/75  ?05/31/21 122/84  ?04/05/21 120/72  ? ?Pulse Readings from Last 3 Encounters:  ?06/28/21 83  ?05/31/21 90  ?04/05/21 93  ? ?Wt Readings from Last 3 Encounters:  ?06/28/21 100 lb (45.4 kg)  ?05/31/21 97 lb 8 oz (44.2 kg)  ?04/05/21 103 lb 6 oz (46.9 kg)  ? ? ?Assessment: Review of patient past medical history, allergies, medications, health  status, including review of consultants reports, laboratory and other test data, was performed as part of comprehensive evaluation and provision of chronic care management services.  ? ?SDOH:  (Social Determinants of Health) assessments and interventions performed:  ? ? ? ?CCM Care Plan ? ?No Known Allergies ? ?Medications Reviewed Today   ? ? Reviewed by Shelda Pal, DO (Physician) on 06/28/21 at 1108  Med List Status: <None>  ? ?Medication  Order Taking? Sig Documenting Provider Last Dose Status Informant  ?acetaminophen (TYLENOL) 325 MG tablet [id number] Yes Take 650 mg by mouth every 6 (six) hours as needed (pain). [provider] Taking Active Self  ?albuterol (PROVENTIL) (2.5 MG/3ML) 0.083% nebulizer solution [id number] Yes Take 3 mLs (2.5 mg total) by nebulization every 6 (six) hours as needed for wheezing or shortness of breath. Shelda Pal, DO Taking Active Self  ?atorvastatin (LIPITOR) 40 MG tablet [id number] Yes Take 1 tablet (40 mg total) by mouth daily. Shelda Pal, DO Taking Active   ?busPIRone (BUSPAR) 15 MG tablet [id number] Yes Take 1 tablet (15 mg total) by mouth 3 (three) times daily. Shelda Pal, DO Taking Active   ?calcitRIOL (ROCALTROL) 0.5 MCG capsule [id number] Yes Take 1 mcg by mouth daily. [provider] Taking Active Self  ?calcium carbonate (TUMS - DOSED IN MG ELEMENTAL CALCIUM) 500 MG chewable tablet [id number] Yes Chew 1 tablet by mouth in the morning, at noon, and at bedtime. [provider] Taking Active Self  ?cholecalciferol (VITAMIN D3) 25 MCG (1000 UNIT) tablet [id number] Yes Take 1,000 Units by mouth daily. [provider] Taking Active Self  ?Cyanocobalamin (VITAMIN B-12 PO) [id number] Yes Take 1 tablet by mouth daily. [provider] Taking Active Self  ?diclofenac Sodium (VOLTAREN) 1 % GEL [id number] Yes Apply 2 g topically 4 (four) times daily as needed (for joint pain). [provider] Taking Active   ?Fluticasone-Umeclidin-Vilant (TRELEGY ELLIPTA) 100-62.5-25 MCG/INH AEPB [id number] Yes Inhale 1 puff into the lungs daily. Shelda Pal, DO Taking Active Self  ?guaifenesin (HUMIBI

## 2021-07-05 ENCOUNTER — Ambulatory Visit: Payer: Medicare Other | Admitting: Family Medicine

## 2021-07-11 ENCOUNTER — Encounter: Payer: Self-pay | Admitting: Cardiology

## 2021-07-16 DIAGNOSIS — Z20822 Contact with and (suspected) exposure to covid-19: Secondary | ICD-10-CM | POA: Diagnosis not present

## 2021-07-16 DIAGNOSIS — I2510 Atherosclerotic heart disease of native coronary artery without angina pectoris: Secondary | ICD-10-CM

## 2021-07-16 DIAGNOSIS — E890 Postprocedural hypothyroidism: Secondary | ICD-10-CM | POA: Diagnosis not present

## 2021-07-17 ENCOUNTER — Ambulatory Visit (INDEPENDENT_AMBULATORY_CARE_PROVIDER_SITE_OTHER): Payer: Medicare Other

## 2021-07-17 DIAGNOSIS — Z0000 Encounter for general adult medical examination without abnormal findings: Secondary | ICD-10-CM

## 2021-07-17 DIAGNOSIS — Z780 Asymptomatic menopausal state: Secondary | ICD-10-CM

## 2021-07-17 DIAGNOSIS — Z1231 Encounter for screening mammogram for malignant neoplasm of breast: Secondary | ICD-10-CM

## 2021-07-17 NOTE — Patient Instructions (Signed)
Andrea Ward , ?Thank you for taking time to come for your Medicare Wellness Visit. I appreciate your ongoing commitment to your health goals. Please review the following plan we discussed and let me know if I can assist you in the future.  ? ?Screening recommendations/referrals: ?Colonoscopy: 04/02/16 due 04/02/26 ?Mammogram: ordered 07/17/21 ?Bone Density: ordered 07/17/21 ?Recommended yearly ophthalmology/optometry visit for glaucoma screening and checkup ?Recommended yearly dental visit for hygiene and checkup ? ?Vaccinations: ?Influenza vaccine: Due-May obtain vaccine at our office or your local pharmacy.  ?Pneumococcal vaccine: up to date ?Tdap vaccine: up to date ?Shingles vaccine: Due-May obtain vaccine at our office or your local pharmacy.    ?Covid-19:Due-May obtain vaccine at our office or your local pharmacy.  ? ?Advanced directives: no ? ?Conditions/risks identified: see problem list  ? ?Next appointment: Follow up in one year for your annual wellness visit  ? ? ?Preventive Care 74 Years and Older, Female ?Preventive care refers to lifestyle choices and visits with your health care provider that can promote health and wellness. ?What does preventive care include? ?A yearly physical exam. This is also called an annual well check. ?Dental exams once or twice a year. ?Routine eye exams. Ask your health care provider how often you should have your eyes checked. ?Personal lifestyle choices, including: ?Daily care of your teeth and gums. ?Regular physical activity. ?Eating a healthy diet. ?Avoiding tobacco and drug use. ?Limiting alcohol use. ?Practicing safe sex. ?Taking low-dose aspirin every day. ?Taking vitamin and mineral supplements as recommended by your health care provider. ?What happens during an annual well check? ?The services and screenings done by your health care provider during your annual well check will depend on your age, overall health, lifestyle risk factors, and family history of  disease. ?Counseling  ?Your health care provider may ask you questions about your: ?Alcohol use. ?Tobacco use. ?Drug use. ?Emotional well-being. ?Home and relationship well-being. ?Sexual activity. ?Eating habits. ?History of falls. ?Memory and ability to understand (cognition). ?Work and work Statistician. ?Reproductive health. ?Screening  ?You may have the following tests or measurements: ?Height, weight, and BMI. ?Blood pressure. ?Lipid and cholesterol levels. These may be checked every 5 years, or more frequently if you are over 19 years old. ?Skin check. ?Lung cancer screening. You may have this screening every year starting at age 39 if you have a 30-pack-year history of smoking and currently smoke or have quit within the past 15 years. ?Fecal occult blood test (FOBT) of the stool. You may have this test every year starting at age 38. ?Flexible sigmoidoscopy or colonoscopy. You may have a sigmoidoscopy every 5 years or a colonoscopy every 10 years starting at age 41. ?Hepatitis C blood test. ?Hepatitis B blood test. ?Sexually transmitted disease (STD) testing. ?Diabetes screening. This is done by checking your blood sugar (glucose) after you have not eaten for a while (fasting). You may have this done every 1-3 years. ?Bone density scan. This is done to screen for osteoporosis. You may have this done starting at age 7. ?Mammogram. This may be done every 1-2 years. Talk to your health care provider about how often you should have regular mammograms. ?Talk with your health care provider about your test results, treatment options, and if necessary, the need for more tests. ?Vaccines  ?Your health care provider may recommend certain vaccines, such as: ?Influenza vaccine. This is recommended every year. ?Tetanus, diphtheria, and acellular pertussis (Tdap, Td) vaccine. You may need a Td booster every 10 years. ?Zoster vaccine. You  may need this after age 58. ?Pneumococcal 13-valent conjugate (PCV13) vaccine. One  dose is recommended after age 98. ?Pneumococcal polysaccharide (PPSV23) vaccine. One dose is recommended after age 64. ?Talk to your health care provider about which screenings and vaccines you need and how often you need them. ?This information is not intended to replace advice given to you by your health care provider. Make sure you discuss any questions you have with your health care provider. ?Document Released: 04/01/2015 Document Revised: 11/23/2015 Document Reviewed: 01/04/2015 ?Elsevier Interactive Patient Education ? 2017 [place]. ? ?Fall Prevention in the Home ?Falls can cause injuries. They can happen to people of all ages. There are many things you can do to make your home safe and to help prevent falls. ?What can I do on the outside of my home? ?Regularly fix the edges of walkways and driveways and fix any cracks. ?Remove anything that might make you trip as you walk through a door, such as a raised step or threshold. ?Trim any bushes or trees on the path to your home. ?Use bright outdoor lighting. ?Clear any walking paths of anything that might make someone trip, such as rocks or tools. ?Regularly check to see if handrails are loose or broken. Make sure that both sides of any steps have handrails. ?Any raised decks and porches should have guardrails on the edges. ?Have any leaves, snow, or ice cleared regularly. ?Use sand or salt on walking paths during winter. ?Clean up any spills in your garage right away. This includes oil or grease spills. ?What can I do in the bathroom? ?Use night lights. ?Install grab bars by the toilet and in the tub and shower. Do not use towel bars as grab bars. ?Use non-skid mats or decals in the tub or shower. ?If you need to sit down in the shower, use a plastic, non-slip stool. ?Keep the floor dry. Clean up any water that spills on the floor as soon as it happens. ?Remove soap buildup in the tub or shower regularly. ?Attach bath mats securely with double-sided  non-slip rug tape. ?Do not have throw rugs and other things on the floor that can make you trip. ?What can I do in the bedroom? ?Use night lights. ?Make sure that you have a light by your bed that is easy to reach. ?Do not use any sheets or blankets that are too big for your bed. They should not hang down onto the floor. ?Have a firm chair that has side arms. You can use this for support while you get dressed. ?Do not have throw rugs and other things on the floor that can make you trip. ?What can I do in the kitchen? ?Clean up any spills right away. ?Avoid walking on wet floors. ?Keep items that you use a lot in easy-to-reach places. ?If you need to reach something above you, use a strong step stool that has a grab bar. ?Keep electrical cords out of the way. ?Do not use floor polish or wax that makes floors slippery. If you must use wax, use non-skid floor wax. ?Do not have throw rugs and other things on the floor that can make you trip. ?What can I do with my stairs? ?Do not leave any items on the stairs. ?Make sure that there are handrails on both sides of the stairs and use them. Fix handrails that are broken or loose. Make sure that handrails are as long as the stairways. ?Check any carpeting to make sure that it is firmly  attached to the stairs. Fix any carpet that is loose or worn. ?Avoid having throw rugs at the top or bottom of the stairs. If you do have throw rugs, attach them to the floor with carpet tape. ?Make sure that you have a light switch at the top of the stairs and the bottom of the stairs. If you do not have them, ask someone to add them for you. ?What else can I do to help prevent falls? ?Wear shoes that: ?Do not have high heels. ?Have rubber bottoms. ?Are comfortable and fit you well. ?Are closed at the toe. Do not wear sandals. ?If you use a stepladder: ?Make sure that it is fully opened. Do not climb a closed stepladder. ?Make sure that both sides of the stepladder are locked into place. ?Ask  someone to hold it for you, if possible. ?Clearly mark and make sure that you can see: ?Any grab bars or handrails. ?First and last steps. ?Where the edge of each step is. ?Use tools that help you move around (mobility aids)

## 2021-07-17 NOTE — Progress Notes (Signed)
? ?Subjective:  ? Andrea Ward is a 76 y.o. female who presents for Medicare Annual (Subsequent) preventive examination. ? ?I connected with  Andrea Ward on 07/17/21 by a audio enabled telemedicine application and verified that I am speaking with the correct person using two identifiers. ? ?Patient Location: Home ? ?Provider Location: Office/Clinic ? ?I discussed the limitations of evaluation and management by telemedicine. The patient expressed understanding and agreed to proceed.  ? ?Review of Systems    ? ?Cardiac Risk Factors include: advanced age (>57mn, >>64women);dyslipidemia;hypertension ? ?   ?Objective:  ?  ?There were no vitals filed for this visit. ?There is no height or weight on file to calculate BMI. ? ? ?  07/17/2021  ?  1:06 PM 03/21/2021  ?  7:09 AM 07/20/2019  ? 11:04 AM 02/26/2019  ?  8:12 AM 07/14/2018  ? 11:16 AM 07/09/2017  ? 10:22 AM 05/15/2016  ?  8:26 AM  ?Advanced Directives  ?Does Patient Have a Medical Advance Directive? No No Yes Yes Yes Yes No  ?Type of AScientist, physiologicalof APillowLiving will HHornitosLiving will [place]ove CreekLiving will [place]Living will   ?Does patient want to make changes to medical advance directive?   No - Patient declined No - Patient declined No - Patient declined No - Patient declined   ?Copy of [place] Chart?   No - copy requested No - copy requested No - copy requested No - copy requested   ?Would patient like information on creating a medical advance directive? No - Patient declined No - Patient declined     No - Patient declined  ? ? ?Current Medications (verified) ?Outpatient Encounter Medications as of 07/17/2021  ?Medication Sig  ? acetaminophen (TYLENOL) 325 MG tablet Take 650 mg by mouth every 6 (six) hours as needed (pain).  ? albuterol (PROVENTIL) (2.5 MG/3ML) 0.083% nebulizer solution Take 3 mLs (2.5 mg total) by nebulization every 6 (six) hours as  needed for wheezing or shortness of breath.  ? atorvastatin (LIPITOR) 40 MG tablet Take 1 tablet (40 mg total) by mouth daily.  ? busPIRone (BUSPAR) 15 MG tablet Take 1 tablet (15 mg total) by mouth 3 (three) times daily.  ? calcitRIOL (ROCALTROL) 0.5 MCG capsule Take 1 mcg by mouth daily.  ? calcium carbonate (TUMS - DOSED IN MG ELEMENTAL CALCIUM) 500 MG chewable tablet Chew 1 tablet by mouth in the morning, at noon, and at bedtime.  ? cholecalciferol (VITAMIN D3) 25 MCG (1000 UNIT) tablet Take 1,000 Units by mouth daily.  ? Cyanocobalamin (VITAMIN B-12 PO) Take 1 tablet by mouth daily.  ? diclofenac Sodium (VOLTAREN) 1 % GEL Apply 2 g topically 4 (four) times daily as needed (for joint pain).  ? Fluticasone-Umeclidin-Vilant (TRELEGY ELLIPTA) 100-62.5-25 MCG/ACT AEPB Inhale 1 puff into the lungs daily.  ? guaifenesin (HUMIBID E) 400 MG TABS tablet Take 400 mg by mouth every 6 (six) hours as needed (cough/congestion).  ? guaiFENesin-dextromethorphan (ROBITUSSIN DM) 100-10 MG/5ML syrup Take 5 mLs by mouth every 4 (four) hours as needed for cough.  ? levothyroxine (SYNTHROID) 75 MCG tablet Take 1 tablet (75 mcg total) by mouth daily before breakfast.  ? mirtazapine (REMERON) 15 MG tablet Take 1 tablet (15 mg total) by mouth at bedtime.  ? propranolol (INDERAL) 40 MG tablet Take 1 tablet (40 mg total) by mouth 3 (three) times daily.  ? traMADol (ULTRAM) 50 MG tablet  Take 1 tablet (50 mg total) by mouth every 8 (eight) hours as needed.  ? VENTOLIN HFA 108 (90 Base) MCG/ACT inhaler INHALE 1 TO 2 PUFFS BY MOUTH EVERY 6 HOURS AS NEEDED FOR WHEEZING AND FOR SHORTNESS OF BREATH  ? ?No facility-administered encounter medications on file as of 07/17/2021.  ? ? ?Allergies (verified) ?Patient has no known allergies.  ? ?History: ?Past Medical History:  ?Diagnosis Date  ? Anemia   ? 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 Aortic atherosclerosis ([place])   ? Carotid artery disease ([place])   ?  Emphysema of lung ([place])   ? Generalized anxiety disorder 05/23/2016  ? GERD (gastroesophageal reflux disease) 05/23/2016  ? History of pregnancy induced hypertension   ? Hypoparathyroidism ([place])   ? Acquired post surg  ? Hypothyroid   ? ?Past Surgical History:  ?Procedure Laterality Date  ? THYROIDECTOMY  07/11/2016  ? thyroidectomy & parathyroid autotransplantation  ? TONSILLECTOMY    ? ?Family History  ?Problem Relation Age of Onset  ? Hypertension Mother   ? Hypertension Father   ? Anuerysm Sister   ? ?Social History  ? ?Socioeconomic History  ? Marital status: Single  ?  Spouse name: Not on file  ? Number of children: Not on file  ? Years of education: Not on file  ? Highest education level: Not on file  ?Occupational History  ? Not on file  ?Tobacco Use  ? Smoking status: Some Days  ?  Years: 50.00  ?  Types: Cigarettes  ?  Start date: 03/19/1965  ?  Last attempt to quit: 11/10/2018  ?  Years since quitting: 2.6  ? Smokeless tobacco: Never  ? Tobacco comments:  ?  stopped smoknig august/2020!  ?Vaping Use  ? Vaping Use: Never used  ?Substance and Sexual Activity  ? Alcohol use: Yes  ?  Alcohol/week: 5.0 standard drinks  ?  Types: 5 Glasses of wine per week  ?  Comment: occasional glass of wine  ? Drug use: Never  ? Sexual activity: Not Currently  ?Other Topics Concern  ? Not on file  ?Social History Narrative  ? Not on file  ? ?Social Determinants of Health  ? ?Financial Resource Strain: Medium Risk  ? Difficulty of Paying Living Expenses: Somewhat hard  ?Food Insecurity: Not on file  ?Transportation Needs: No Transportation Needs  ? Lack of Transportation (Medical): No  ? Lack of Transportation (Non-Medical): No  ?Physical Activity: Sufficiently Active  ? Days of Exercise per Week: 5 days  ? Minutes of Exercise per Session: 30 min  ?Stress: Not on file  ?Social Connections: Not on file  ? ? ?Tobacco Counseling ?Ready to quit: Not Answered ?Counseling given: Not Answered ?Tobacco comments: stopped smoknig  august/2020! ? ? ?Clinical Intake: ? ?Pre-visit preparation completed: Yes ? ?Pain : No/denies pain ? ?  ? ?Nutritional Risks: None ?Diabetes: No ? ?How often do you need to have someone help you when you read instructions, pamphlets, or other written materials from your doctor or pharmacy?: 1 - Never ? ?Diabetic?No ? ?Interpreter Needed?: No ? ?Information entered by :: Jaquavious Mercer ? ? ?Activities of Daily Living ? ?  07/17/2021  ?  1:09 PM 07/13/2021  ?  8:08 PM  ?In your present state of health, do you have any difficulty performing the following activities:  ?Hearing? 0 0  ?Vision? 0 0  ?Difficulty concentrating or making decisions? 0 0  ?Walking or climbing  stairs? 0 0  ?Dressing or bathing? 0 0  ?Doing errands, shopping? 0 0  ?Preparing Food and eating ? N N  ?Using the Toilet? N N  ?In the past six months, have you accidently leaked urine? N N  ?Do you have problems with loss of bowel control? N N  ?Managing your Medications? Y N  ?Managing your Finances? Y N  ?Housekeeping or managing your Housekeeping? Y N  ? ? ?Patient Care Team: ?Shelda Pal, DO as PCP - General (Family Medicine) ?Buford Dresser, MD as PCP - Cardiology (Cardiology) ?Juanito Doom, MD as Consulting Physician (Pulmonary Disease) ?[name], RPH-CPP (Pharmacist) ?Garner Nash, DO as Consulting Physician (Pulmonary Disease) ? ?Indicate any recent Medical Services you may have received from other than [facility] providers in the past year (date may be approximate). ? ?   ?Assessment:  ? This is a routine wellness examination for [place]. ? ?Hearing/Vision screen ?No results found. ? ?Dietary issues and exercise activities discussed: ?Current Exercise Habits: Home exercise routine, Type of exercise: Other - see comments;walking, Time (Minutes): 60, Frequency (Times/Week): 7, Weekly Exercise (Minutes/Week): 420, Intensity: Mild ? ? Goals Addressed   ? ?  ?  ?  ?  ? This Visit's Progress  ?  Quit Smoking   Not on track   ? ?  ? ?Depression Screen ? ?  07/17/2021  ?  1:07 PM 09/14/2020  ?  3:27 PM 07/20/2019  ? 11:10 AM 07/14/2018  ? 11:17 AM 07/09/2017  ? 10:24 AM 05/23/2016  ?  8:34 AM  ?PHQ 2/9 Scores  ?PHQ - 2 Score 0 0 0 0 0 0  ?  ?Fall

## 2021-07-18 DIAGNOSIS — H43821 Vitreomacular adhesion, right eye: Secondary | ICD-10-CM | POA: Diagnosis not present

## 2021-07-18 DIAGNOSIS — H524 Presbyopia: Secondary | ICD-10-CM | POA: Diagnosis not present

## 2021-07-18 DIAGNOSIS — Z20822 Contact with and (suspected) exposure to covid-19: Secondary | ICD-10-CM | POA: Diagnosis not present

## 2021-07-18 DIAGNOSIS — H5213 Myopia, bilateral: Secondary | ICD-10-CM | POA: Diagnosis not present

## 2021-07-18 DIAGNOSIS — H35342 Macular cyst, hole, or pseudohole, left eye: Secondary | ICD-10-CM | POA: Diagnosis not present

## 2021-07-18 DIAGNOSIS — H2513 Age-related nuclear cataract, bilateral: Secondary | ICD-10-CM | POA: Diagnosis not present

## 2021-07-18 DIAGNOSIS — H35372 Puckering of macula, left eye: Secondary | ICD-10-CM | POA: Diagnosis not present

## 2021-07-18 DIAGNOSIS — G43109 Migraine with aura, not intractable, without status migrainosus: Secondary | ICD-10-CM | POA: Diagnosis not present

## 2021-07-18 DIAGNOSIS — H52203 Unspecified astigmatism, bilateral: Secondary | ICD-10-CM | POA: Diagnosis not present

## 2021-07-20 DIAGNOSIS — Z20822 Contact with and (suspected) exposure to covid-19: Secondary | ICD-10-CM | POA: Diagnosis not present

## 2021-08-15 ENCOUNTER — Inpatient Hospital Stay (HOSPITAL_BASED_OUTPATIENT_CLINIC_OR_DEPARTMENT_OTHER): Admission: RE | Admit: 2021-08-15 | Payer: Medicare Other | Source: Ambulatory Visit

## 2021-08-15 ENCOUNTER — Other Ambulatory Visit (HOSPITAL_BASED_OUTPATIENT_CLINIC_OR_DEPARTMENT_OTHER): Payer: Medicare Other

## 2021-08-16 ENCOUNTER — Other Ambulatory Visit: Payer: Self-pay | Admitting: Family Medicine

## 2021-08-16 DIAGNOSIS — M1991 Primary osteoarthritis, unspecified site: Secondary | ICD-10-CM

## 2021-08-16 NOTE — Telephone Encounter (Signed)
Last OV---06/28/2021 Last RF---#30 with 2 refills on 05/19/21

## 2021-08-22 ENCOUNTER — Inpatient Hospital Stay (HOSPITAL_BASED_OUTPATIENT_CLINIC_OR_DEPARTMENT_OTHER): Admission: RE | Admit: 2021-08-22 | Payer: Medicare Other | Source: Ambulatory Visit

## 2021-08-22 ENCOUNTER — Ambulatory Visit (HOSPITAL_BASED_OUTPATIENT_CLINIC_OR_DEPARTMENT_OTHER): Payer: Medicare Other

## 2021-09-16 DEATH — deceased

## 2021-09-25 ENCOUNTER — Telehealth: Payer: Medicare Other

## 2021-10-02 ENCOUNTER — Ambulatory Visit (HOSPITAL_BASED_OUTPATIENT_CLINIC_OR_DEPARTMENT_OTHER): Payer: PRIVATE HEALTH INSURANCE

## 2022-07-10 ENCOUNTER — Telehealth: Payer: Self-pay | Admitting: Family Medicine

## 2022-07-10 NOTE — Telephone Encounter (Signed)
Copied from CRM [id number]. Topic: Medicare AWV >> Jul 10, 2022  9:44 AM Payton Doughty wrote: Reason for CRM: Called patient to schedule Medicare Annual Wellness Visit (AWV). Left message for patient to call back and schedule Medicare Annual Wellness Visit (AWV).  Last date of AWV: 07/17/21  Please schedule an appointment at any time with Donne Anon, CMA  .  If any questions, please contact me.  Thank you ,  Verlee Rossetti; Care Guide Ambulatory Clinical Support [place] [facility] Direct Dial: [phone]

## 2023-07-24 ENCOUNTER — Encounter: Payer: Self-pay | Admitting: *Deleted
